# Patient Record
Sex: Female | Born: 1935 | Race: Black or African American | Hispanic: No | Smoking: Former smoker
Health system: Southern US, Community
[De-identification: ages and names within clinical notes are randomized; demographics above are authoritative.]

## PROBLEM LIST (undated history)

## (undated) DIAGNOSIS — I10 Essential (primary) hypertension: Secondary | ICD-10-CM

## (undated) DIAGNOSIS — I82419 Acute embolism and thrombosis of unspecified femoral vein: Secondary | ICD-10-CM

## (undated) DIAGNOSIS — M1990 Unspecified osteoarthritis, unspecified site: Secondary | ICD-10-CM

## (undated) DIAGNOSIS — F0390 Unspecified dementia without behavioral disturbance: Secondary | ICD-10-CM

## (undated) HISTORY — PX: ABDOMINAL HYSTERECTOMY: SHX81

## (undated) HISTORY — PX: BACK SURGERY: SHX140

---

## 1999-03-14 ENCOUNTER — Encounter: Admission: RE | Admit: 1999-03-14 | Discharge: 1999-04-04 | Payer: Self-pay | Admitting: *Deleted

## 1999-05-09 ENCOUNTER — Ambulatory Visit (HOSPITAL_COMMUNITY): Admission: RE | Admit: 1999-05-09 | Discharge: 1999-05-09 | Payer: Self-pay | Admitting: Family Medicine

## 1999-05-09 ENCOUNTER — Encounter: Payer: Self-pay | Admitting: Family Medicine

## 1999-06-09 ENCOUNTER — Other Ambulatory Visit: Admission: RE | Admit: 1999-06-09 | Discharge: 1999-06-09 | Payer: Self-pay | Admitting: Family Medicine

## 1999-07-07 ENCOUNTER — Observation Stay (HOSPITAL_COMMUNITY): Admission: RE | Admit: 1999-07-07 | Discharge: 1999-07-08 | Payer: Self-pay | Admitting: Neurosurgery

## 2000-01-13 ENCOUNTER — Encounter: Admission: RE | Admit: 2000-01-13 | Discharge: 2000-02-02 | Payer: Self-pay | Admitting: Neurosurgery

## 2000-02-06 ENCOUNTER — Encounter: Admission: RE | Admit: 2000-02-06 | Discharge: 2000-03-22 | Payer: Self-pay | Admitting: Neurosurgery

## 2002-12-30 ENCOUNTER — Ambulatory Visit (HOSPITAL_COMMUNITY): Admission: RE | Admit: 2002-12-30 | Discharge: 2002-12-30 | Payer: Self-pay | Admitting: Family Medicine

## 2002-12-30 ENCOUNTER — Encounter: Payer: Self-pay | Admitting: Family Medicine

## 2004-05-17 ENCOUNTER — Ambulatory Visit (HOSPITAL_COMMUNITY): Admission: RE | Admit: 2004-05-17 | Discharge: 2004-05-17 | Payer: Self-pay | Admitting: Family Medicine

## 2004-06-03 ENCOUNTER — Encounter: Admission: RE | Admit: 2004-06-03 | Discharge: 2004-06-03 | Payer: Self-pay | Admitting: Gastroenterology

## 2004-07-07 ENCOUNTER — Encounter: Admission: RE | Admit: 2004-07-07 | Discharge: 2004-07-07 | Payer: Self-pay | Admitting: Gastroenterology

## 2004-07-15 ENCOUNTER — Encounter: Admission: RE | Admit: 2004-07-15 | Discharge: 2004-07-15 | Payer: Self-pay | Admitting: Gastroenterology

## 2004-08-10 ENCOUNTER — Ambulatory Visit (HOSPITAL_COMMUNITY): Admission: RE | Admit: 2004-08-10 | Discharge: 2004-08-10 | Payer: Self-pay | Admitting: Gastroenterology

## 2004-08-26 ENCOUNTER — Encounter: Admission: RE | Admit: 2004-08-26 | Discharge: 2004-08-26 | Payer: Self-pay | Admitting: Gastroenterology

## 2005-11-17 ENCOUNTER — Encounter (INDEPENDENT_AMBULATORY_CARE_PROVIDER_SITE_OTHER): Payer: Self-pay | Admitting: Specialist

## 2005-11-17 ENCOUNTER — Ambulatory Visit (HOSPITAL_COMMUNITY): Admission: RE | Admit: 2005-11-17 | Discharge: 2005-11-17 | Payer: Self-pay | Admitting: Gastroenterology

## 2006-04-25 ENCOUNTER — Encounter: Admission: RE | Admit: 2006-04-25 | Discharge: 2006-04-25 | Payer: Self-pay | Admitting: Family Medicine

## 2006-05-07 IMAGING — CT CT ABDOMEN W/O CM
1 series · 15 of 32 positions shown, 19 images · IV contrast (GASTRO.)
Comparison: none

CLINICAL DATA: Right upper quadrant pain and right mid abdominal pain.  
 CT ABDOMEN WITHOUT CONTRAST ? 06/03/04 
 Scans were performed without IV contrast due to elevated creatinine of 1.7.
 The scan demonstrates a 4.8 x 6.0 cm low density lesion in the superior aspect of the left lobe of the liver.  This is nonspecific.  It could represent a benign hepatic cyst, but ultrasound may be useful for further evaluation of this liver lesion.  The remainder of the liver appears normal.  The spleen and pancreas and adrenal glands are normal.  Both kidney appear normal with no hydronephrosis or discrete mass.  There is a 9 mm lymph node between the inferior vena cava and the aorta on image #34 as well as a small, nonspecific node between the head of the pancreas and the inferior vena cava on image #21.  These are most likely benign reactive nodes.  There are no dilated bile ducts, and the gallbladder is not distended.  There are no dilated loops of large or small bowel, and there is no free air or free fluid.  Diffuse degenerative spurs are present throughout the lumbar spine.  
 IMPRESSION
 6 cm low density lesion in the left lobe of the liver, which cannot be further characterized on this unenhanced CT scan.  Ultrasound is recommended for further evaluation of this lesion.
 No other significant abnormality. 
 CT PELVIS WITHOUT CONTRAST ? 06/03/04 
 The uterus and left ovary appear to have been removed.  There are several benign appearing calcifications in the otherwise normal right ovary.  There is no free fluid or diverticular disease.  The appendix appears to have been   removed.  No dilated bowel.
 Negative CT of the pelvis.

[Series 2: — · axial · 0.70mm/px · z∈[-376,-6]mm · 15 of 83 slices shown, 19 images]
[im 6/83  soft-tissue]
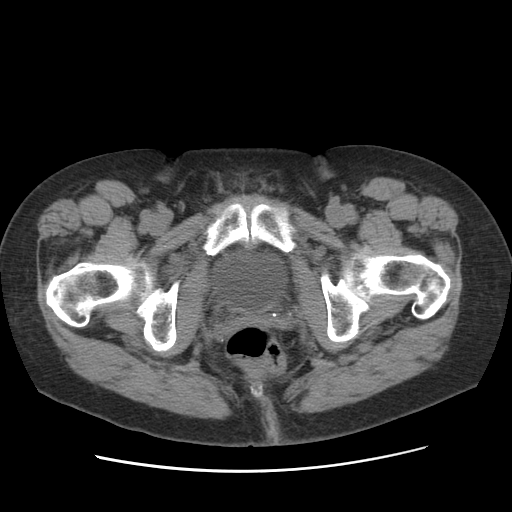
[im 6/83  bone]
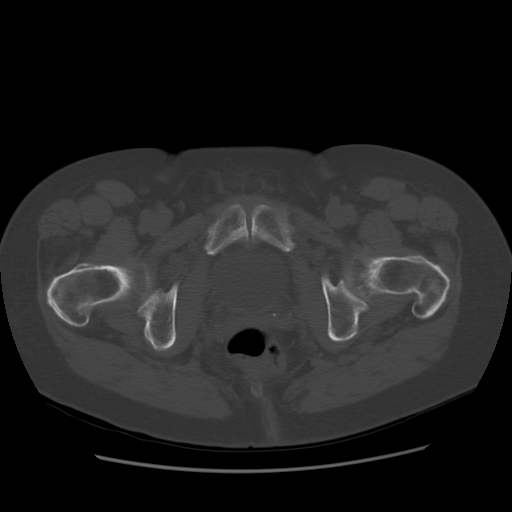
[im 11/83  soft-tissue]
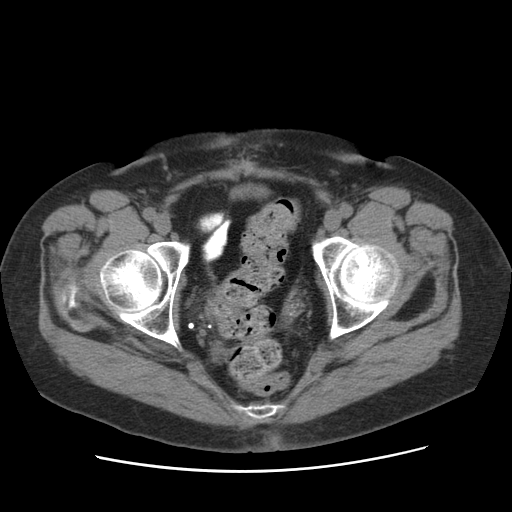
[im 16/83  soft-tissue]
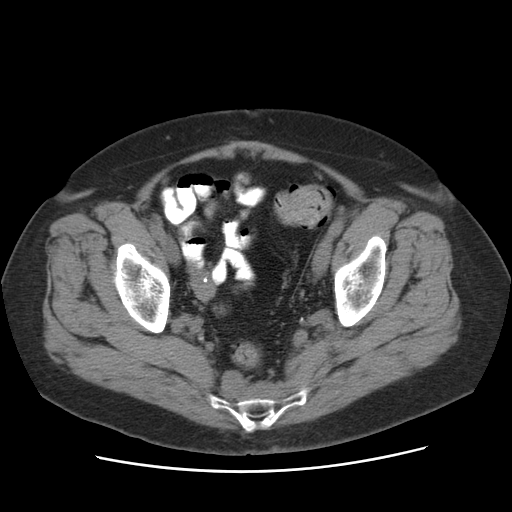
[im 24/83  soft-tissue]
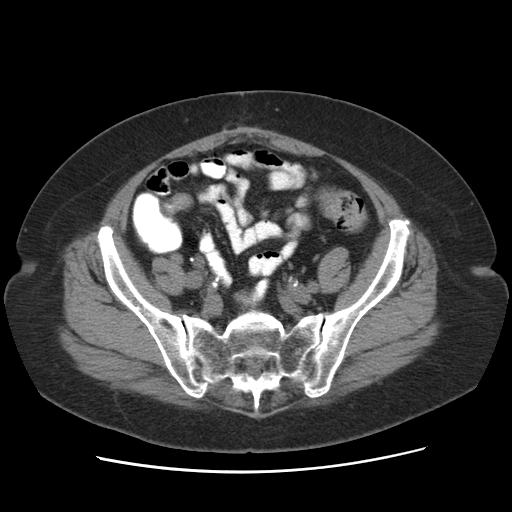
[im 30/83  soft-tissue]
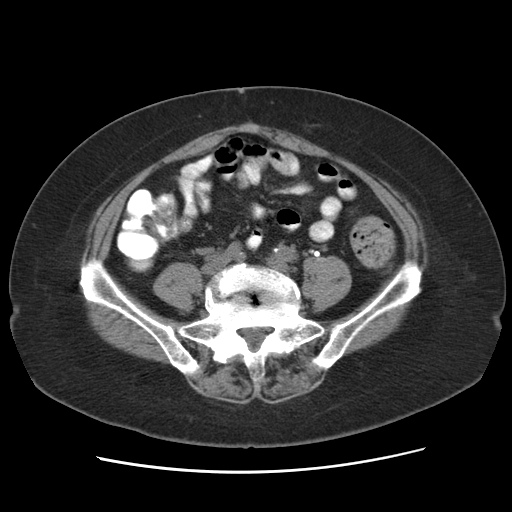
[im 35/83  soft-tissue]
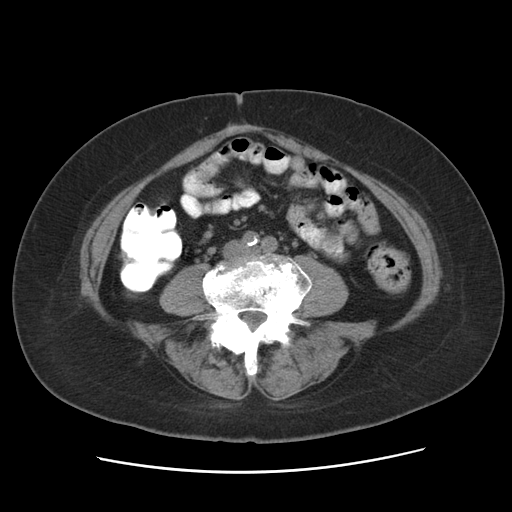
[im 43/83  soft-tissue]
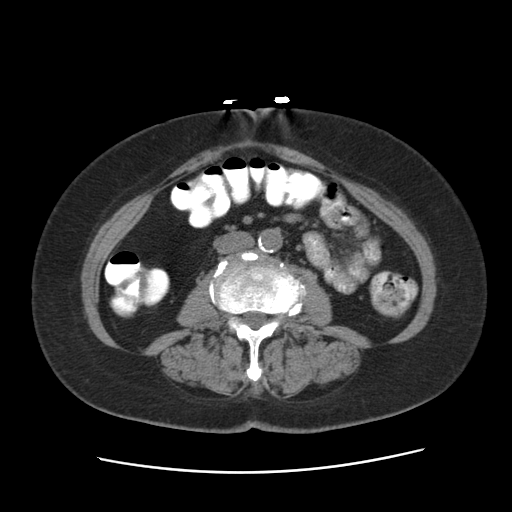
[im 48/83  soft-tissue]
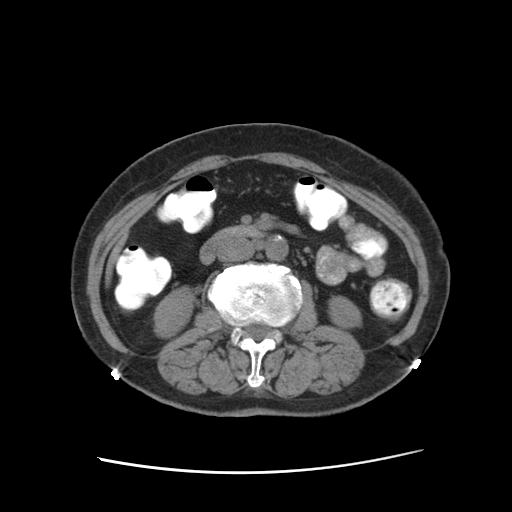
[im 53/83  soft-tissue]
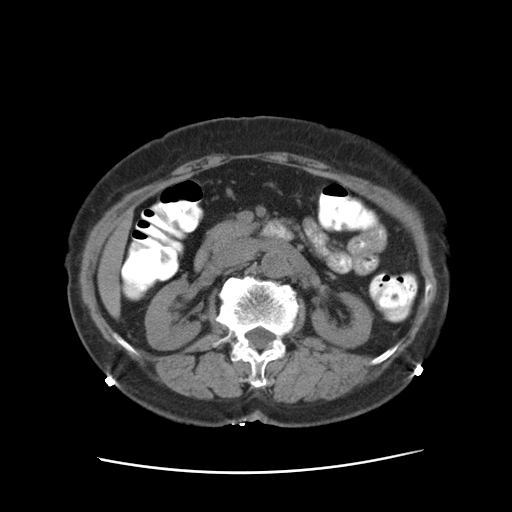
[im 53/83  bone]
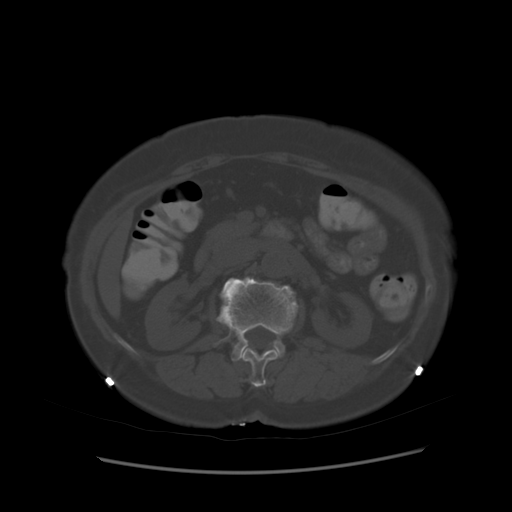
[im 59/83  soft-tissue]
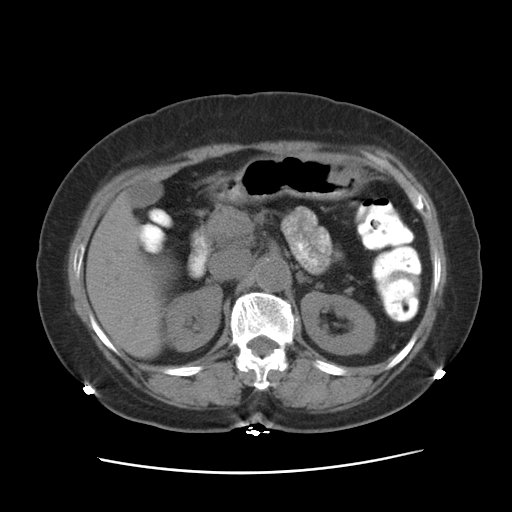
[im 67/83  soft-tissue]
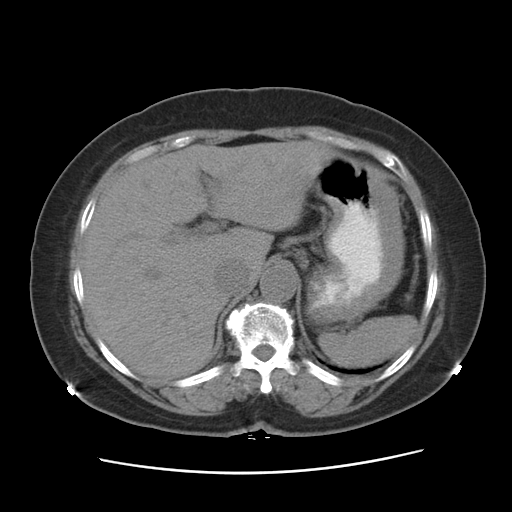
[im 72/83  soft-tissue]
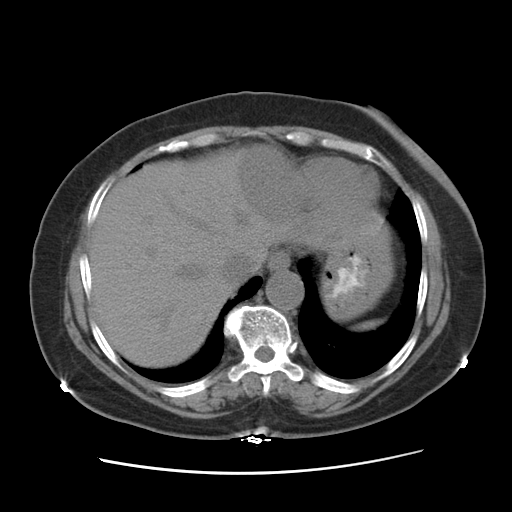
[im 72/83  lung]
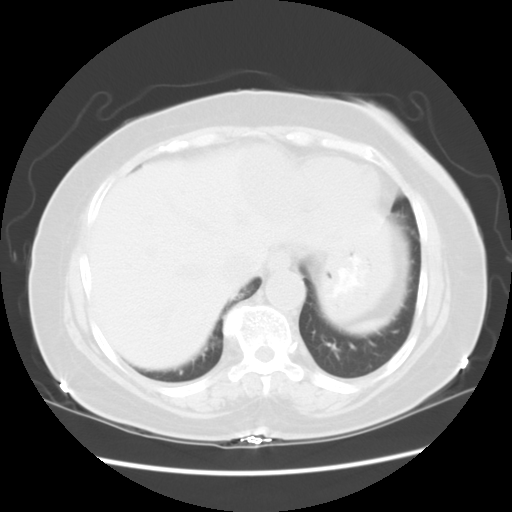
[im 75/83  lung]
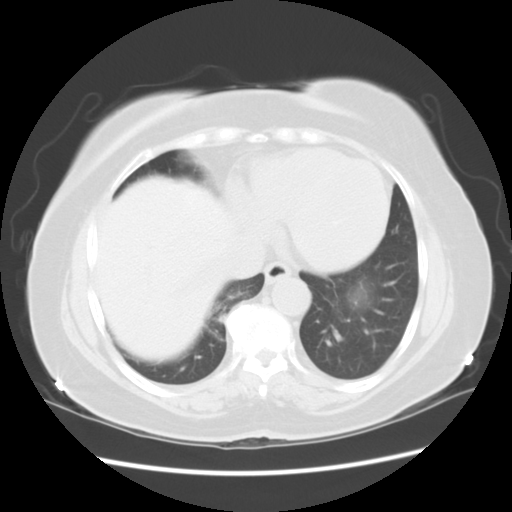
[im 77/83  soft-tissue]
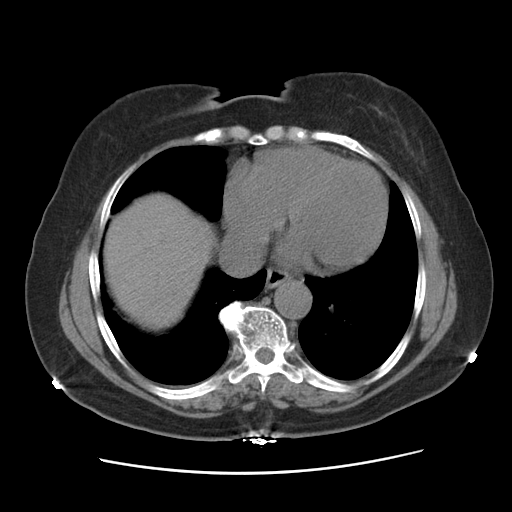
[im 77/83  lung]
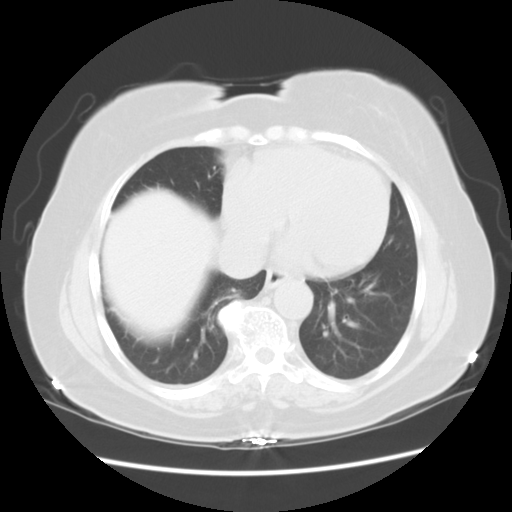
[im 80/83  lung]
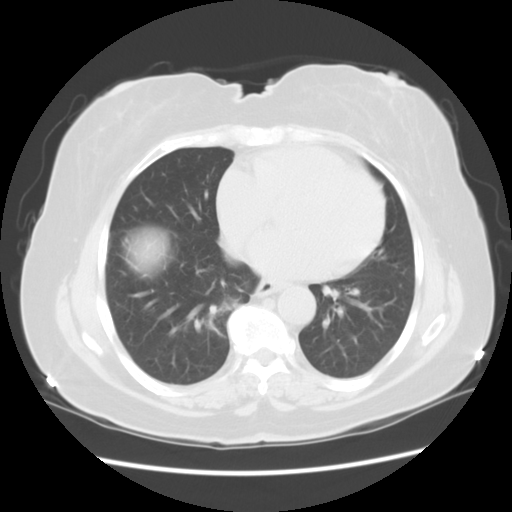

[15 of 32 positions shown; findings below may reference images not displayed]

## 2008-03-29 ENCOUNTER — Emergency Department (HOSPITAL_COMMUNITY): Admission: EM | Admit: 2008-03-29 | Discharge: 2008-03-30 | Payer: Self-pay | Admitting: Emergency Medicine

## 2008-08-04 ENCOUNTER — Encounter: Admission: RE | Admit: 2008-08-04 | Discharge: 2008-08-04 | Payer: Self-pay | Admitting: Family Medicine

## 2008-08-05 ENCOUNTER — Emergency Department (HOSPITAL_COMMUNITY): Admission: EM | Admit: 2008-08-05 | Discharge: 2008-08-05 | Payer: Self-pay | Admitting: Emergency Medicine

## 2008-09-16 ENCOUNTER — Encounter: Admission: RE | Admit: 2008-09-16 | Discharge: 2008-11-09 | Payer: Self-pay | Admitting: Family Medicine

## 2009-04-21 ENCOUNTER — Encounter: Admission: RE | Admit: 2009-04-21 | Discharge: 2009-04-21 | Payer: Self-pay | Admitting: Family Medicine

## 2009-05-04 ENCOUNTER — Ambulatory Visit (HOSPITAL_COMMUNITY): Admission: RE | Admit: 2009-05-04 | Discharge: 2009-05-04 | Payer: Self-pay | Admitting: Family Medicine

## 2009-08-24 ENCOUNTER — Encounter: Payer: Self-pay | Admitting: Neurology

## 2009-08-31 ENCOUNTER — Encounter (INDEPENDENT_AMBULATORY_CARE_PROVIDER_SITE_OTHER): Payer: Self-pay | Admitting: Neurology

## 2009-08-31 ENCOUNTER — Ambulatory Visit (HOSPITAL_COMMUNITY): Admission: RE | Admit: 2009-08-31 | Discharge: 2009-08-31 | Payer: Self-pay | Admitting: Neurology

## 2009-08-31 ENCOUNTER — Ambulatory Visit: Payer: Self-pay | Admitting: Internal Medicine

## 2009-08-31 ENCOUNTER — Ambulatory Visit: Payer: Self-pay

## 2010-09-22 ENCOUNTER — Ambulatory Visit (HOSPITAL_COMMUNITY)
Admission: RE | Admit: 2010-09-22 | Discharge: 2010-09-22 | Payer: Self-pay | Source: Home / Self Care | Attending: Internal Medicine | Admitting: Internal Medicine

## 2011-02-10 NOTE — Op Note (Signed)
[name] [name], [name] [name]                ACCOUNT NO.:  [id number]   MEDICAL RECORD NO.:  [id number]          PATIENT TYPE:  AMB   LOCATION:  ENDO                         FACILITY:  [facility]   PHYSICIAN:  Anselmo Rod, M.D.  DATE OF BIRTH:  10-Jan-1935   DATE OF PROCEDURE:  11/17/2005  DATE OF DISCHARGE:                                 OPERATIVE REPORT   PROCEDURE:  Esophagogastroduodenoscopy with gastric biopsies.   ENDOSCOPIST:  Charna Elizabeth, M.D.   INSTRUMENT USED:  Olympus video panendoscope.   INDICATIONS FOR PROCEDURE:  A 76 year old African American female underwent  EGD for dysphagia, rule out esophagitis, gastritis, Barrett's mucosa,  esophageal stricture.   PREPROCEDURE PREPARATION:  Informed consent was procured from the patient.  The patient was fasted for eight hours prior to the procedure.   PREPROCEDURE PHYSICAL:  VITAL SIGNS:  The patient had stable vital signs.  NECK:  Supple.  CHEST:  Clear to auscultation.  CARDIAC:  S1 and S2 regular.  ABDOMEN:  Soft with normal bowel sounds.   DESCRIPTION OF PROCEDURE:  The patient was placed in the left lateral  decubitus position and sedated with of fentanyl and 4 mg of Versed in  slow incremental doses.  Once the patient was adequately sedated, maintained  on low-flow oxygen and continuous cardiac monitoring, the Olympus video  panendoscope was advance through the mouth piece over the tongue, into the  esophagus under direct vision.  The entire esophagus appears widely patent  with no evidence of rings, stricture, masses, esophagitis or Barrett's  mucosa.  The scope was then advanced into the stomach.  Diffuse gastritis  was noted.  Biopsies were done to rule out presence of H pylori by  pathology.  The proximal small bowel appeared normal.  Retroflexion in the  high cardia revealed no abnormalities.   IMPRESSION:  1.Widely patent esophagus.  2.Diffuse gastritis.  Biopsy done to rule out Helicobacter pylori.  3.Normal  proximal small bowel.   RECOMMENDATIONS:  1.Await pathology result.  2.Continue PPI for now.  3.Outpatient followup in the next two weeks for further recommendations.  4.Avoidance of nonsteroidals is encouraged.      Anselmo Rod, M.D.  Electronically Signed     JNM/MEDQ  D:  11/17/2005  T:  11/18/2005  Job:  [id number]   cc:   Renaye Rakers, M.D.  Fax: [phone]   Mindi Slicker. Lowell Guitar, M.D.  Fax: [phone]

## 2012-06-05 ENCOUNTER — Emergency Department (HOSPITAL_COMMUNITY)
Admission: EM | Admit: 2012-06-05 | Discharge: 2012-06-05 | Disposition: A | Discharge status: Home / Self Care | Payer: Medicare Other | Attending: Emergency Medicine | Admitting: Emergency Medicine

## 2012-06-05 ENCOUNTER — Emergency Department (HOSPITAL_COMMUNITY): Payer: Medicare Other

## 2012-06-05 ENCOUNTER — Encounter (HOSPITAL_COMMUNITY): Payer: Self-pay | Admitting: Emergency Medicine

## 2012-06-05 DIAGNOSIS — I639 Cerebral infarction, unspecified: Secondary | ICD-10-CM

## 2012-06-05 DIAGNOSIS — R5383 Other fatigue: Secondary | ICD-10-CM | POA: Insufficient documentation

## 2012-06-05 DIAGNOSIS — F0390 Unspecified dementia without behavioral disturbance: Secondary | ICD-10-CM | POA: Insufficient documentation

## 2012-06-05 DIAGNOSIS — R5381 Other malaise: Secondary | ICD-10-CM | POA: Insufficient documentation

## 2012-06-05 DIAGNOSIS — I6350 Cerebral infarction due to unspecified occlusion or stenosis of unspecified cerebral artery: Secondary | ICD-10-CM | POA: Insufficient documentation

## 2012-06-05 DIAGNOSIS — Z79899 Other long term (current) drug therapy: Secondary | ICD-10-CM | POA: Insufficient documentation

## 2012-06-05 DIAGNOSIS — F29 Unspecified psychosis not due to a substance or known physiological condition: Secondary | ICD-10-CM | POA: Insufficient documentation

## 2012-06-05 DIAGNOSIS — R410 Disorientation, unspecified: Secondary | ICD-10-CM

## 2012-06-05 DIAGNOSIS — I10 Essential (primary) hypertension: Secondary | ICD-10-CM | POA: Insufficient documentation

## 2012-06-05 HISTORY — DX: Unspecified osteoarthritis, unspecified site: M19.90

## 2012-06-05 HISTORY — DX: Essential (primary) hypertension: I10

## 2012-06-05 HISTORY — DX: Unspecified dementia, unspecified severity, without behavioral disturbance, psychotic disturbance, mood disturbance, and anxiety: F03.90

## 2012-06-05 LAB — URINALYSIS, ROUTINE W REFLEX MICROSCOPIC
Bilirubin Urine: NEGATIVE
Glucose, UA: NEGATIVE mg/dL
Hgb urine dipstick: NEGATIVE
Ketones, ur: NEGATIVE mg/dL
Leukocytes, UA: NEGATIVE
Nitrite: NEGATIVE
Protein, ur: 100 mg/dL — AB
Specific Gravity, Urine: 1.013 (ref 1.005–1.030)
Urobilinogen, UA: 0.2 mg/dL (ref 0.0–1.0)
pH: 8 (ref 5.0–8.0)

## 2012-06-05 LAB — RAPID URINE DRUG SCREEN, HOSP PERFORMED
Amphetamines: NOT DETECTED
Barbiturates: NOT DETECTED
Benzodiazepines: NOT DETECTED
Cocaine: NOT DETECTED
Opiates: NOT DETECTED
Tetrahydrocannabinol: NOT DETECTED

## 2012-06-05 LAB — POCT I-STAT, CHEM 8
BUN: 19 mg/dL (ref 6–23)
Calcium, Ion: 1.19 mmol/L (ref 1.13–1.30)
Chloride: 103 mEq/L (ref 96–112)
Creatinine, Ser: 1.6 mg/dL — ABNORMAL HIGH (ref 0.50–1.10)
Glucose, Bld: 146 mg/dL — ABNORMAL HIGH (ref 70–99)
HCT: 38 % (ref 36.0–46.0)
Hemoglobin: 12.9 g/dL (ref 12.0–15.0)
Potassium: 4.1 mEq/L (ref 3.5–5.1)
Sodium: 143 mEq/L (ref 135–145)
TCO2: 26 mmol/L (ref 0–100)

## 2012-06-05 LAB — URINE MICROSCOPIC-ADD ON

## 2012-06-05 LAB — BASIC METABOLIC PANEL
BUN: 18 mg/dL (ref 6–23)
CO2: 27 mEq/L (ref 19–32)
Calcium: 9.3 mg/dL (ref 8.4–10.5)
Chloride: 101 mEq/L (ref 96–112)
Creatinine, Ser: 1.4 mg/dL — ABNORMAL HIGH (ref 0.50–1.10)
GFR calc Af Amer: 41 mL/min — ABNORMAL LOW (ref >90)
GFR calc non Af Amer: 36 mL/min — ABNORMAL LOW (ref >90)
Glucose, Bld: 144 mg/dL — ABNORMAL HIGH (ref 70–99)
Potassium: 4.1 mEq/L (ref 3.5–5.1)
Sodium: 137 mEq/L (ref 135–145)

## 2012-06-05 LAB — CBC
HCT: 35.3 % — ABNORMAL LOW (ref 36.0–46.0)
Hemoglobin: 11.5 g/dL — ABNORMAL LOW (ref 12.0–15.0)
MCH: 26.8 pg (ref 26.0–34.0)
MCHC: 32.6 g/dL (ref 30.0–36.0)
MCV: 82.3 fL (ref 78.0–100.0)
Platelets: 197 10*3/uL (ref 150–400)
RBC: 4.29 MIL/uL (ref 3.87–5.11)
RDW: 15.9 % — ABNORMAL HIGH (ref 11.5–15.5)
WBC: 4.4 10*3/uL (ref 4.0–10.5)

## 2012-06-05 LAB — GLUCOSE, CAPILLARY: Glucose-Capillary: 140 mg/dL — ABNORMAL HIGH (ref 70–99)

## 2012-06-05 NOTE — ED Notes (Signed)
Pt received to room 15. Pt alert to name on arrival to ED

## 2012-06-05 NOTE — ED Notes (Signed)
ekg completed

## 2012-06-05 NOTE — ED Notes (Signed)
Pt sleeping, family member at bedside.

## 2012-06-05 NOTE — Progress Notes (Signed)
Referring Physician: Delo    Chief Complaint: Confusion  HPI: [name] [name] is an 77 y.o. female who initially presented to the ED at [facility].  It seems that she awakened normal today and went to her nephrologist for a regular check up.  As usual her BP was elevated and her physician decided to make some medication changes.  Patient's blood pressure is usually markedly elevated and has been for years per family.  These changes were yet to be initiated.  On return home the family had lunch after which she just fell asleep and has been sleepy and confused since.  When patient remained difficult to arouse, EMS was called at that time.    LSN: [id number] tPA Given: No: Outside time window  Past Medical History  Diagnosis Date  . Dementia   . Hypertension   . Arthritis     Past Surgical History  Procedure Date  . Back surgery   . Abdominal hysterectomy     History reviewed. No pertinent family history. Social History:  reports that she has quit smoking. She does not have any smokeless tobacco history on file. She reports that she does not drink alcohol or use illicit drugs.  Allergies: No Known Allergies  Medications: I have reviewed the patient's current medications. Prior to Admission:  Current outpatient prescriptions:cloNIDine (CATAPRES) 0.3 MG tablet, Take 0.3 mg by mouth 2 (two) times daily., Disp: , Rfl: ;  furosemide (LASIX) 40 MG tablet, Take 40 mg by mouth 2 (two) times daily., Disp: , Rfl: ;  labetalol (NORMODYNE) 200 MG tablet, Take 400 mg by mouth 2 (two) times daily., Disp: , Rfl: ;  losartan (COZAAR) 50 MG tablet, Take 50 mg by mouth daily., Disp: , Rfl:  memantine (NAMENDA) 10 MG tablet, Take 10 mg by mouth 2 (two) times daily., Disp: , Rfl: ;  NIFEdipine (PROCARDIA XL/ADALAT-CC) 60 MG 24 hr tablet, Take 60 mg by mouth daily., Disp: , Rfl:   ROS: History obtained from daughter  General ROS: negative for - chills, fatigue, fever, night sweats, weight gain or weight  loss Psychological ROS: negative for - behavioral disorder, hallucinations, memory difficulties, mood swings or suicidal ideation Ophthalmic ROS: negative for - blurry vision, double vision, eye pain or loss of vision ENT ROS: negative for - epistaxis, nasal discharge, oral lesions, sore throat, tinnitus or vertigo Allergy and Immunology ROS: negative for - hives or itchy/watery eyes Hematological and Lymphatic ROS: negative for - bleeding problems, bruising or swollen lymph nodes Endocrine ROS: negative for - galactorrhea, hair pattern changes, polydipsia/polyuria or temperature intolerance Respiratory ROS: negative for - cough, hemoptysis, shortness of breath or wheezing Cardiovascular ROS: negative for - chest pain, dyspnea on exertion, edema or irregular heartbeat Gastrointestinal ROS: negative for - abdominal pain, diarrhea, hematemesis, nausea/vomiting or stool incontinence Genito-Urinary ROS: negative for - dysuria, hematuria, incontinence or urinary frequency/urgency Musculoskeletal ROS: negative for - joint swelling or muscular weakness Neurological ROS: as noted in HPI Dermatological ROS: negative for rash and skin lesion changes  Physical Examination: Blood pressure 125/62, pulse 75, temperature 97.6 F (36.4 C), temperature source Oral, resp. rate 18, SpO2 100.00%. CV-pulses intact Neurologic Examination: Mental Status: Alert.  Oriented to name and place but not to month.  Clearly confused.  Unable to recall any events from the day.  Speech fluent without evidence of aphasia.  Able to follow 3 step commands without difficulty. Cranial Nerves: II: Discs flat bilaterally; Visual fields grossly normal, pupils equal, round, reactive to light  and accommodation III,IV, VI: ptosis not present, extra-ocular motions intact bilaterally V,VII: decreased right NLF, facial light touch sensation normal bilaterally VIII: hearing normal bilaterally IX,X: gag reflex present XI: bilateral  shoulder shrug XII: midline tongue extension Motor: Right : Upper extremity   5/5    Left:     Upper extremity   5/5  Lower extremity   5/5     Lower extremity   5/5 Tone and bulk:normal tone throughout; no atrophy noted Sensory: Pinprick and light touch intact throughout, bilaterally Deep Tendon Reflexes: 2+ in the upper extremities and at the right knee, absent otherwise. Plantars: Right: downgoing   Left: downgoing Cerebellar: normal finger-to-nose and normal heel-to-shin test Gait: Unable to perform  Laboratory Studies:  Basic Metabolic Panel:  Lab 06/05/12 1478 06/05/12 1448  NA 143 137  K 4.1 4.1  CL 103 101  CO2 -- 27  GLUCOSE 146* 144*  BUN 19 18  CREATININE 1.60* 1.40*  CALCIUM -- 9.3  MG -- --  PHOS -- --    Liver Function Tests: No results found for this basename: AST:5,ALT:5,ALKPHOS:5,BILITOT:5,PROT:5,ALBUMIN:5 in the last 168 hours No results found for this basename: LIPASE:5,AMYLASE:5 in the last 168 hours No results found for this basename: AMMONIA:3 in the last 168 hours  CBC:  Lab 06/05/12 1456 06/05/12 1448  WBC -- 4.4  NEUTROABS -- --  HGB 12.9 11.5*  HCT 38.0 35.3*  MCV -- 82.3  PLT -- 197    Cardiac Enzymes: No results found for this basename: CKTOTAL:5,CKMB:5,CKMBINDEX:5,TROPONINI:5 in the last 168 hours  BNP: No components found with this basename: POCBNP:5  CBG:  Lab 06/05/12 1436  GLUCAP 140*    Microbiology: No results found for this or any previous visit.  Coagulation Studies: No results found for this basename: LABPROT:5,INR:5 in the last 72 hours  Urinalysis:  Lab 06/05/12 1712  COLORURINE YELLOW  LABSPEC 1.013  PHURINE 8.0  GLUCOSEU NEGATIVE  HGBUR NEGATIVE  BILIRUBINUR NEGATIVE  KETONESUR NEGATIVE  PROTEINUR 100*  UROBILINOGEN 0.2  NITRITE NEGATIVE  LEUKOCYTESUR NEGATIVE    Lipid Panel: No results found for this basename: chol, trig, hdl, cholhdl, vldl, ldlcalc    HgbA1C:  No results found for this  basename: HGBA1C    Urine Drug Screen:   No results found for this basename: labopia, cocainscrnur, labbenz, amphetmu, thcu, labbarb    Alcohol Level: No results found for this basename: ETH:2 in the last 168 hours  Other results: EKG: trigeminy  Imaging: Dg Chest 1 View  06/05/2012  *RADIOLOGY REPORT*  Clinical Data: Weakness, fatigue.  CHEST - 1 VIEW  Comparison: None.  Findings: Low lung volumes with cardiomegaly and mild vascular congestion.  No confluent opacities or edema.  No acute bony abnormality.  IMPRESSION: Low lung volumes with mild cardiomegaly and vascular congestion.   Original Report Authenticated By: Cyndie Chime, M.D.    Ct Head Wo Contrast  06/05/2012  *RADIOLOGY REPORT*  Clinical Data: Altered mental status.  Weakness.  CT HEAD WITHOUT CONTRAST  Technique:  Contiguous axial images were obtained from the base of the skull through the vertex without contrast.  Comparison: 08/24/2009  Findings: No acute intracranial abnormality.  Specifically, no hemorrhage, hydrocephalus, mass lesion, acute infarction, or significant intracranial injury.  No acute calvarial abnormality. Visualized paranasal sinuses and mastoids clear.  Orbital soft tissues unremarkable.  IMPRESSION: No acute intracranial abnormality.   Original Report Authenticated By: Cyndie Chime, M.D.     Assessment: 77 y.o. female presenting with confusion  but no other lateralizing signs on her exam.  EKG abnormal.  BP in the normal range which is quite unusual for this patient.  Concerned about a cardiac cause to her symptoms.  Patient outside the time window for tPA and NIHSS not elevated enough to warrant intervention.  CT reviewed and unremarkable.  Case discussed with Dr. Judd Lien and family.   Stroke Risk Factors - hypertension  Plan: 1. HgbA1c, fasting lipid panel 2. MRI, MRA  of the brain without contrast with further stroke work up to be performed only if acute infarct noted on imaging.  3. ASA 81mg   daily 4. Telemetry monitoring 5. Frequent neuro checks   Thana Farr, MD [facility] [phone] 06/05/2012, 5:57 PM

## 2012-06-05 NOTE — ED Notes (Signed)
Per EMS: Pt was recently put on clonidine.  Started it today and was supposed to take 1 pill twice a day but instead took 2 pills this morning.  Pt began feeling lethargic about 1300 today.  Pt is extremely fatigued (will fall asleep if you stop talking to her for a few seconds).  Denies chest pain, SOB, negative stroke, no NVD.

## 2012-06-05 NOTE — ED Notes (Signed)
NIH stroke scale performed by rapid response team. Pt able to follow directions, family at bedside. EKG repeated.

## 2012-06-05 NOTE — ED Notes (Signed)
AVW:[id number]<XB> Expected date:<BR> Expected time:<BR> Means of arrival:<BR> Comments:<BR> weakness

## 2012-06-05 NOTE — ED Notes (Signed)
Code stroke canceled  

## 2012-06-05 NOTE — ED Provider Notes (Signed)
History     CSN: [id number]  Arrival date & time 06/05/12  1404   First MD Initiated Contact with Patient 06/05/12 1512      Chief Complaint  Patient presents with  . Fatigue  . Weakness    (Consider location/radiation/quality/duration/timing/severity/associated sxs/prior treatment) HPI Comments: Pt with hx of HTN, HL comes in with cc of fatigue, difficult to arouse. Pt with no hx of strokes brought in to the hospital by family as she was acting different. Pt was having lunch, was doing well, and then she just fell asleep, and has been sleepy the whole time since, difficult to arouse - and famly decided to call EMS. Last normal, 12:30-1 pm per family. No hx of strokes, no n/v/f/c/falls. Mo meds changes, no narcotics being taken currently. No hx of similar complains before.  LEVEL 5 CAVEAT - PT CONFUSED.  Patient is a 77 y.o. female presenting with weakness. The history is provided by the patient.  Weakness  Additional symptoms include weakness.    Past Medical History  Diagnosis Date  . Dementia   . Hypertension   . Arthritis     Past Surgical History  Procedure Date  . Back surgery   . Abdominal hysterectomy     History reviewed. No pertinent family history.  History  Substance Use Topics  . Smoking status: Former Games developer  . Smokeless tobacco: Not on file  . Alcohol Use: No    OB History    Grav Para Term Preterm Abortions TAB SAB Ect Mult Living                  Review of Systems  Unable to perform ROS: Mental status change  Neurological: Positive for weakness.    Allergies  Review of patient's allergies indicates no known allergies.  Home Medications   Current Outpatient Rx  Name Route Sig Dispense Refill  . CLONIDINE HCL 0.3 MG PO TABS Oral Take 0.3 mg by mouth 2 (two) times daily.    . FUROSEMIDE 40 MG PO TABS Oral Take 40 mg by mouth 2 (two) times daily.    Marland Kitchen LABETALOL HCL 200 MG PO TABS Oral Take 400 mg by mouth 2 (two) times daily.    Marland Kitchen  LOSARTAN POTASSIUM 50 MG PO TABS Oral Take 50 mg by mouth daily.    Marland Kitchen MEMANTINE HCL 10 MG PO TABS Oral Take 10 mg by mouth 2 (two) times daily.    Marland Kitchen NIFEDIPINE ER OSMOTIC 60 MG PO TB24 Oral Take 60 mg by mouth daily.      BP 113/58  Pulse 79  Temp 98.6 F (37 C) (Oral)  Resp 18  SpO2 98%  Physical Exam  Nursing note and vitals reviewed. Constitutional: She appears well-developed and well-nourished.       SLEEPY - WAS DIFFICULT TO AROUSE  HENT:  Head: Normocephalic and atraumatic.  Eyes: EOM are normal. Pupils are equal, round, and reactive to light.  Neck: Neck supple.  Cardiovascular: Normal rate, regular rhythm and normal heart sounds.   No murmur heard. Pulmonary/Chest: Effort normal. No respiratory distress. She has no wheezes.  Abdominal: Soft. She exhibits no distension. There is no tenderness. There is no rebound and no guarding.  Neurological:       Arousable, not alert, sluggish Oriented to self and location. Mild right sided facial droop. Cerebellar exam is WNL, PERRL - no pi point pupils, Motor and sensory exams are wnl.  Skin: Skin is warm and dry.  ED Course  Procedures (including critical care time)  Labs Reviewed  CBC - Abnormal; Notable for the following:    Hemoglobin 11.5 (*)     HCT 35.3 (*)     RDW 15.9 (*)     All other components within normal limits  BASIC METABOLIC PANEL - Abnormal; Notable for the following:    Glucose, Bld 144 (*)     Creatinine, Ser 1.40 (*)     GFR calc non Af Amer 36 (*)     GFR calc Af Amer 41 (*)     All other components within normal limits  GLUCOSE, CAPILLARY - Abnormal; Notable for the following:    Glucose-Capillary 140 (*)     All other components within normal limits  POCT I-STAT, CHEM 8 - Abnormal; Notable for the following:    Creatinine, Ser 1.60 (*)     Glucose, Bld 146 (*)     All other components within normal limits  CBC WITH DIFFERENTIAL  BASIC METABOLIC PANEL  TROPONIN I  URINALYSIS, ROUTINE W  REFLEX MICROSCOPIC  URINE RAPID DRUG SCREEN (HOSP PERFORMED)   No results found.   1. Stroke       MDM  DDx includes:  Stroke - ischemic vs. hemorrhagic TIA Neuropathy Myelitis Electrolyte abnormality Neuropathy Muscular disease Medication OD Hypoglycemia  Pt comes in with cc of change in mental status. She is somnolent, difficult to arouse, but arousable, and i have no focal neuro findings besides possible droop and the alertness level. She has no meds that put her at risk for having AMS, and there is no report of fall - and the time of onset is withing the stroke treatment window for this otherwise very functional woman.  Code stroke to be activated. Dr. Thad Ranger given a signout.        Derwood Kaplan, MD 06/05/12 1652

## 2012-06-05 NOTE — ED Notes (Signed)
Stroke team at bedside on arrival to ED

## 2012-06-10 NOTE — ED Provider Notes (Signed)
The patient was sent to [facility] this afternoon for evaluation of possible CVA.  She was seen by Dr. Rhunette Croft at [facility] and a Code Stroke was initiated.  The head ct was negative for stroke and was then sent here for neurological consultation.  She was seen by Dr. Thad Ranger who did not feel as though this was a neurological issue.  She was more concerned about there being frequent pvc's on the ekg and rhythm strip.  She asked me to discuss admission with medicine for further observation and workup as indicated.  Dr. Thad Ranger also did not feel as though further imaging with an mri was indicated.    At this point, I went in to re-evaluate the patient.  She was feeling much better and had no complaints.  The sleepiness and difficulty speaking had completely resolved.  Upon further questioning, she had informed me that she had taken a new medication just prior to the onset of her symptoms.  She was started on a rather large dose of clonidine for her blood pressure, 0.3 mg twice daily.  I re-examined her and vitals remained stable and she remained afebrile.  She was neurologically intact.  There was no focal weakness or deficits and her speech was clear.  The heart was regular rate and rhythm and the lungs were clear.  I reviewed the patient's laboratory tests, imaging, studies, and ekg.  There were pvc's on the ekg but no all other tests were either normal or at her baseline.  As far as the pvc's go, they were frequent but were asymptomatic and likely incidental.    I had a lengthy conversation with her and her family about what had transpired while she was here, why she had been transferred, and the results of the neurology consultation.  Based on the timing of the event and the results of the tests, I get the impression that this episode was more likely related to an adverse reaction to being started on a rather large dose of clonidine for someone of her size and stature.  Dr. Thad Ranger doubted a stroke and I agree  with this.  I had discussed admission with the patient and family, however she stated that she preferred to go home.  I explained to them the risks and benefits of being in the hospital, however they have decided that discharge to home is the preferred disposition.  She and the family assure me that they will follow up with her primary doctor in the very near future and return here should her symptoms worsen or recur.  I have also advised them that she should decrease her dose of clonidine to 0.15 mg (break the 3 mg tablets in half) twice daily.      Geoffery Lyons, MD 06/10/12 [phone]

## 2012-08-14 ENCOUNTER — Other Ambulatory Visit (HOSPITAL_COMMUNITY): Payer: Self-pay | Admitting: Cardiovascular Disease

## 2012-08-14 DIAGNOSIS — I10 Essential (primary) hypertension: Secondary | ICD-10-CM

## 2012-08-14 DIAGNOSIS — R9431 Abnormal electrocardiogram [ECG] [EKG]: Secondary | ICD-10-CM

## 2012-08-14 DIAGNOSIS — R55 Syncope and collapse: Secondary | ICD-10-CM

## 2012-09-03 ENCOUNTER — Ambulatory Visit (HOSPITAL_COMMUNITY): Payer: Medicare Other

## 2012-09-03 ENCOUNTER — Encounter (HOSPITAL_COMMUNITY): Payer: Medicare Other

## 2012-09-25 ENCOUNTER — Emergency Department (HOSPITAL_COMMUNITY)
Admission: EM | Admit: 2012-09-25 | Discharge: 2012-09-25 | Disposition: A | Discharge status: Home / Self Care | Payer: Medicare Other | Source: Home / Self Care | Attending: Family Medicine | Admitting: Family Medicine

## 2012-09-25 ENCOUNTER — Encounter (HOSPITAL_COMMUNITY): Payer: Self-pay | Admitting: *Deleted

## 2012-09-25 DIAGNOSIS — L723 Sebaceous cyst: Secondary | ICD-10-CM

## 2012-09-25 DIAGNOSIS — L089 Local infection of the skin and subcutaneous tissue, unspecified: Secondary | ICD-10-CM

## 2012-09-25 NOTE — ED Notes (Signed)
Pt's BP informed RN Alinda Money and rechecked BP

## 2012-09-25 NOTE — ED Notes (Signed)
Pt  Reports    A      Painfull        Swollen   Area to back  Of  Neck         X  sev  Weeks        The  Area  Is  Red  Tender  And  Swollen

## 2012-09-25 NOTE — ED Provider Notes (Signed)
History     CSN: [id number]  Arrival date & time 09/25/12  1655   First MD Initiated Contact with Patient 09/25/12 1748      Chief Complaint  Patient presents with  . Abscess    (Consider location/radiation/quality/duration/timing/severity/associated sxs/prior treatment) Patient is a 78 y.o. female presenting with abscess. The history is provided by the patient and a relative.  Abscess  This is a new problem. The current episode started less than one week ago. The onset was sudden. The problem has been gradually worsening. The abscess is present on the neck. The problem is moderate. The abscess is characterized by painfulness and swelling. The abscess first occurred at home.    Past Medical History  Diagnosis Date  . Dementia   . Hypertension   . Arthritis     Past Surgical History  Procedure Date  . Back surgery   . Abdominal hysterectomy     No family history on file.  History  Substance Use Topics  . Smoking status: Former Games developer  . Smokeless tobacco: Not on file  . Alcohol Use: No    OB History    Grav Para Term Preterm Abortions TAB SAB Ect Mult Living                  Review of Systems  Constitutional: Negative.   Skin: Positive for wound.    Allergies  Review of patient's allergies indicates no known allergies.  Home Medications   Current Outpatient Rx  Name  Route  Sig  Dispense  Refill  . CLONIDINE HCL 0.3 MG PO TABS   Oral   Take 0.3 mg by mouth 2 (two) times daily.         . FUROSEMIDE 40 MG PO TABS   Oral   Take 40 mg by mouth 2 (two) times daily.         Marland Kitchen LABETALOL HCL 200 MG PO TABS   Oral   Take 400 mg by mouth 2 (two) times daily.         Marland Kitchen LOSARTAN POTASSIUM 50 MG PO TABS   Oral   Take 50 mg by mouth daily.         Marland Kitchen MEMANTINE HCL 10 MG PO TABS   Oral   Take 10 mg by mouth 2 (two) times daily.         Marland Kitchen NIFEDIPINE ER OSMOTIC 60 MG PO TB24   Oral   Take 60 mg by mouth daily.           BP 134/51  Pulse  40  Temp 97.4 F (36.3 C) (Oral)  Resp 20  SpO2 100%  Physical Exam  Nursing note and vitals reviewed. Constitutional: She is oriented to person, place, and time. She appears well-developed and well-nourished.  Neurological: She is alert and oriented to person, place, and time.  Skin: Skin is warm and dry.       3x4cm infected sebaceous cyst on right post neg., tender , erythematous, fluctuant.    ED Course  INCISION AND DRAINAGE Date/Time: 09/25/2012 6:27 PM Performed by: Linna Hoff Authorized by: Bradd Canary D Consent: Verbal consent obtained. Consent given by: patient Type: abscess Body area: head/neck Location details: neck Local anesthetic: topical anesthetic Patient sedated: no Scalpel size: 11 Incision type: single straight Complexity: simple Drainage: purulent (sebaceous cyst drained and sac expressed.) Drainage amount: moderate Wound treatment: wound left open Packing material: none Patient tolerance: Patient tolerated the procedure well with no  immediate complications.   (including critical care time)  Labs Reviewed - No data to display No results found.   1. Infected sebaceous cyst       MDM          Linna Hoff, MD 09/25/12 [phone]

## 2012-11-27 ENCOUNTER — Other Ambulatory Visit: Payer: Self-pay | Admitting: Internal Medicine

## 2012-11-27 DIAGNOSIS — Z1231 Encounter for screening mammogram for malignant neoplasm of breast: Secondary | ICD-10-CM

## 2012-12-31 ENCOUNTER — Ambulatory Visit
Admission: RE | Admit: 2012-12-31 | Discharge: 2012-12-31 | Disposition: A | Discharge status: Home / Self Care | Payer: Medicare Other | Source: Ambulatory Visit | Attending: Internal Medicine | Admitting: Internal Medicine

## 2012-12-31 DIAGNOSIS — Z1231 Encounter for screening mammogram for malignant neoplasm of breast: Secondary | ICD-10-CM

## 2013-02-09 ENCOUNTER — Emergency Department (HOSPITAL_COMMUNITY): Payer: Medicare Other

## 2013-02-09 ENCOUNTER — Emergency Department (HOSPITAL_COMMUNITY)
Admission: EM | Admit: 2013-02-09 | Discharge: 2013-02-09 | Disposition: A | Discharge status: Home / Self Care | Payer: Medicare Other | Attending: Emergency Medicine | Admitting: Emergency Medicine

## 2013-02-09 ENCOUNTER — Encounter (HOSPITAL_COMMUNITY): Payer: Self-pay | Admitting: Family Medicine

## 2013-02-09 DIAGNOSIS — Z79899 Other long term (current) drug therapy: Secondary | ICD-10-CM | POA: Insufficient documentation

## 2013-02-09 DIAGNOSIS — I10 Essential (primary) hypertension: Secondary | ICD-10-CM | POA: Insufficient documentation

## 2013-02-09 DIAGNOSIS — R42 Dizziness and giddiness: Secondary | ICD-10-CM | POA: Insufficient documentation

## 2013-02-09 DIAGNOSIS — Z8673 Personal history of transient ischemic attack (TIA), and cerebral infarction without residual deficits: Secondary | ICD-10-CM | POA: Insufficient documentation

## 2013-02-09 DIAGNOSIS — Z8739 Personal history of other diseases of the musculoskeletal system and connective tissue: Secondary | ICD-10-CM | POA: Insufficient documentation

## 2013-02-09 DIAGNOSIS — Z87891 Personal history of nicotine dependence: Secondary | ICD-10-CM | POA: Insufficient documentation

## 2013-02-09 DIAGNOSIS — I498 Other specified cardiac arrhythmias: Secondary | ICD-10-CM | POA: Insufficient documentation

## 2013-02-09 DIAGNOSIS — R112 Nausea with vomiting, unspecified: Secondary | ICD-10-CM | POA: Insufficient documentation

## 2013-02-09 DIAGNOSIS — F0390 Unspecified dementia without behavioral disturbance: Secondary | ICD-10-CM | POA: Insufficient documentation

## 2013-02-09 LAB — URINALYSIS, ROUTINE W REFLEX MICROSCOPIC
Bilirubin Urine: NEGATIVE
Glucose, UA: NEGATIVE mg/dL
Hgb urine dipstick: NEGATIVE
Ketones, ur: NEGATIVE mg/dL
Leukocytes, UA: NEGATIVE
Nitrite: NEGATIVE
Protein, ur: 30 mg/dL — AB
Specific Gravity, Urine: 1.013 (ref 1.005–1.030)
Urobilinogen, UA: 0.2 mg/dL (ref 0.0–1.0)
pH: 8.5 — ABNORMAL HIGH (ref 5.0–8.0)

## 2013-02-09 LAB — POCT I-STAT TROPONIN I
Troponin i, poc: 0.01 ng/mL (ref 0.00–0.08)
Troponin i, poc: 0.01 ng/mL (ref 0.00–0.08)
Troponin i, poc: 0.03 ng/mL (ref 0.00–0.08)

## 2013-02-09 LAB — BASIC METABOLIC PANEL
BUN: 19 mg/dL (ref 6–23)
CO2: 29 mEq/L (ref 19–32)
Calcium: 9.8 mg/dL (ref 8.4–10.5)
Chloride: 97 mEq/L (ref 96–112)
Creatinine, Ser: 1.23 mg/dL — ABNORMAL HIGH (ref 0.50–1.10)
GFR calc Af Amer: 48 mL/min — ABNORMAL LOW (ref >90)
GFR calc non Af Amer: 42 mL/min — ABNORMAL LOW (ref >90)
Glucose, Bld: 135 mg/dL — ABNORMAL HIGH (ref 70–99)
Potassium: 4.3 mEq/L (ref 3.5–5.1)
Sodium: 135 mEq/L (ref 135–145)

## 2013-02-09 LAB — CBC
HCT: 40.1 % (ref 36.0–46.0)
Hemoglobin: 13.4 g/dL (ref 12.0–15.0)
MCH: 26.8 pg (ref 26.0–34.0)
MCHC: 33.4 g/dL (ref 30.0–36.0)
MCV: 80.2 fL (ref 78.0–100.0)
Platelets: 240 10*3/uL (ref 150–400)
RBC: 5 MIL/uL (ref 3.87–5.11)
RDW: 15.6 % — ABNORMAL HIGH (ref 11.5–15.5)
WBC: 7 10*3/uL (ref 4.0–10.5)

## 2013-02-09 LAB — URINE MICROSCOPIC-ADD ON

## 2013-02-09 MED ORDER — SODIUM CHLORIDE 0.9 % IV SOLN
Freq: Once | INTRAVENOUS | Status: AC
  Administered 2013-02-09: 17:00:00 via INTRAVENOUS

## 2013-02-09 MED ORDER — SODIUM CHLORIDE 0.9 % IV SOLN
Freq: Once | INTRAVENOUS | Status: AC
  Administered 2013-02-09: 18:00:00 via INTRAVENOUS

## 2013-02-09 NOTE — ED Notes (Signed)
Patient is alert and orientedx4.  Patient was explained discharge instructions and they understood them with no questions.  The patient's daughter, [name] [name] is taking the patient home.

## 2013-02-09 NOTE — ED Notes (Signed)
Pt HR in the 70s NSR. incorrect rate of 40 charted earlier

## 2013-02-09 NOTE — ED Provider Notes (Signed)
History     CSN: [id number]  Arrival date & time 02/09/13  1253   First MD Initiated Contact with Patient 02/09/13 1530      Chief Complaint  Patient presents with  . Dizziness    (Consider location/radiation/quality/duration/timing/severity/associated sxs/prior treatment) Patient is a 78 y.o. female presenting with neurologic complaint. The history is provided by the patient and medical records (The daughter). No language interpreter was used.  Neurologic Problem The primary symptoms include dizziness, nausea and vomiting. Primary symptoms comment: Pt is a 78 yo woman who had dizziness and nausea and vomiting around 11:30 A.M. today.  Now she feels sleepy.  . The symptoms began 2 to 6 hours ago (She had had a prior similar episode in September 2013, was worked up for possible stroke then.). The symptoms are resolved (She feels sleepy now.). The neurological symptoms are diffuse. Context: She has a history of dementia.    She describes the dizziness as lightheadedness. The dizziness began today. The dizziness has been resolved since its onset. Dizziness is a new (Though had workup for possible stroke in September 2013.) problem. Dizziness also occurs with nausea and vomiting.  Associated medical issues comments: Dementia. Procedure history comments: Prior stroke workup in September 2013 was negative..    Past Medical History  Diagnosis Date  . Dementia   . Hypertension   . Arthritis     Past Surgical History  Procedure Laterality Date  . Back surgery    . Abdominal hysterectomy      History reviewed. No pertinent family history.  History  Substance Use Topics  . Smoking status: Former Games developer  . Smokeless tobacco: Not on file  . Alcohol Use: No    OB History   Grav Para Term Preterm Abortions TAB SAB Ect Mult Living                  Review of Systems  Unable to perform ROS: Dementia  Gastrointestinal: Positive for nausea and vomiting.  Neurological: Positive for  dizziness.    Allergies  Review of patient's allergies indicates no known allergies.  Home Medications   Current Outpatient Rx  Name  Route  Sig  Dispense  Refill  . cloNIDine (CATAPRES) 0.3 MG tablet   Oral   Take 0.3 mg by mouth 2 (two) times daily.         . furosemide (LASIX) 40 MG tablet   Oral   Take 40 mg by mouth 2 (two) times daily.         Marland Kitchen labetalol (NORMODYNE) 200 MG tablet   Oral   Take 400 mg by mouth 2 (two) times daily.         Marland Kitchen losartan (COZAAR) 50 MG tablet   Oral   Take 50 mg by mouth daily.         . memantine (NAMENDA) 10 MG tablet   Oral   Take 10 mg by mouth 2 (two) times daily.         Marland Kitchen NIFEdipine (PROCARDIA XL/ADALAT-CC) 60 MG 24 hr tablet   Oral   Take 60 mg by mouth daily.           BP 146/62  Pulse 75  Temp(Src) 97.3 F (36.3 C)  Resp 16  SpO2 100%  Physical Exam  Nursing note and vitals reviewed. Constitutional: She appears well-developed and well-nourished. No distress.  Several VS early in visit showed a bradycardia.    HENT:  Head: Normocephalic and atraumatic.  Right  Ear: External ear normal.  Left Ear: External ear normal.  Mouth/Throat: Oropharynx is clear and moist.  Eyes: Conjunctivae and EOM are normal. Pupils are equal, round, and reactive to light.  Neck: Normal range of motion. Neck supple.  Cardiovascular: Normal rate, regular rhythm and normal heart sounds.   Pulmonary/Chest: Effort normal and breath sounds normal.  Abdominal: Soft. Bowel sounds are normal.  Musculoskeletal: Normal range of motion. She exhibits no edema and no tenderness.  Neurological: She is alert.  No sensory or motor deficit.  Skin: Skin is warm and dry.  Psychiatric:  Pleasantly demented.    ED Course  Procedures (including critical care time)  Labs Reviewed  CBC - Abnormal; Notable for the following:    RDW 15.6 (*)    All other components within normal limits  BASIC METABOLIC PANEL - Abnormal; Notable for the  following:    Glucose, Bld 135 (*)    Creatinine, Ser 1.23 (*)    GFR calc non Af Amer 42 (*)    GFR calc Af Amer 48 (*)    All other components within normal limits  POCT I-STAT TROPONIN I   3:37 PM  Date: 02/09/2013  Rate: 70  Rhythm: normal sinus rhythm and premature ventricular contractions (PVC)  QRS Axis: normal  Intervals: normal PQRS:  Biatrial enlargement.  ST/T Wave abnormalities: Inverted T waves in inferior and lateral leads.  Conduction Disutrbances:none  Narrative Interpretation: Abnormal EKG  Old EKG Reviewed: changes noted--Had prolonged QT interval in tracing of 06/05/2012.  6:54 PM Results for orders placed during the hospital encounter of 02/09/13  CBC      Result Value Range   WBC 7.0  4.0 - 10.5 K/uL   RBC 5.00  3.87 - 5.11 MIL/uL   Hemoglobin 13.4  12.0 - 15.0 g/dL   HCT 04.5  40.9 - 81.1 %   MCV 80.2  78.0 - 100.0 fL   MCH 26.8  26.0 - 34.0 pg   MCHC 33.4  30.0 - 36.0 g/dL   RDW 91.4 (*) 78.2 - 95.6 %   Platelets 240  150 - 400 K/uL  BASIC METABOLIC PANEL      Result Value Range   Sodium 135  135 - 145 mEq/L   Potassium 4.3  3.5 - 5.1 mEq/L   Chloride 97  96 - 112 mEq/L   CO2 29  19 - 32 mEq/L   Glucose, Bld 135 (*) 70 - 99 mg/dL   BUN 19  6 - 23 mg/dL   Creatinine, Ser 2.13 (*) 0.50 - 1.10 mg/dL   Calcium 9.8  8.4 - 08.6 mg/dL   GFR calc non Af Amer 42 (*) >90 mL/min   GFR calc Af Amer 48 (*) >90 mL/min  POCT I-STAT TROPONIN I      Result Value Range   Troponin i, poc 0.01  0.00 - 0.08 ng/mL   Comment 3           POCT I-STAT TROPONIN I      Result Value Range   Troponin i, poc 0.03  0.00 - 0.08 ng/mL   Comment 3           POCT I-STAT TROPONIN I      Result Value Range   Troponin i, poc 0.01  0.00 - 0.08 ng/mL   Comment 3            Ct Head Wo Contrast  02/09/2013   *RADIOLOGY REPORT*  Clinical Data: Dizziness and  vomiting  CT HEAD WITHOUT CONTRAST  Technique:  Contiguous axial images were obtained from the base of the skull through the  vertex without contrast.  Comparison: Head CT 06/05/2012  Findings: No acute intracranial abnormality is identified. Specifically, there is no hemorrhage, hydrocephalus, mass lesion, or evidence of acute infarction.  The calvarium is intact.  The visualized paranasal sinuses and mastoid air cells are clear.  IMPRESSION: No acute intracranial abnormality.   Original Report Authenticated By: Britta Mccreedy, M.D.   Lab workup is negative.  She has a low recorded pulse because she has some bigeminy.  Repeat orthostatic VS and EKG.   7:21 PM  Date: 02/09/2013  Rate: 92  Rhythm: normal sinus rhythm and premature ventricular contractions (PVC) in a bigeminal rhythm  QRS Axis: normal  Intervals: normal  ST/T Wave abnormalities: nonspecific T wave changes  Conduction Disutrbances:none  Narrative Interpretation: Abnormal EKG  Old EKG Reviewed: changes noted--bigeminy is new since tracing earlier this PM.  7:53 PM Was not orthostatic, feels ready to go home.  Advised followup with her internist, Andi Devon, M.D.      1. Dizziness   2. Ventricular bigeminy           Carleene Cooper III, MD 02/09/13 [phone]

## 2013-02-09 NOTE — ED Notes (Signed)
Per pt and family dizziness, vomiting since this am. sts some abdominal cramping and chest pain.

## 2014-02-20 ENCOUNTER — Other Ambulatory Visit: Payer: Self-pay | Admitting: Internal Medicine

## 2014-02-20 DIAGNOSIS — Z1231 Encounter for screening mammogram for malignant neoplasm of breast: Secondary | ICD-10-CM

## 2014-03-05 ENCOUNTER — Ambulatory Visit
Admission: RE | Admit: 2014-03-05 | Discharge: 2014-03-05 | Disposition: A | Discharge status: Home / Self Care | Payer: Medicare Other | Source: Ambulatory Visit | Attending: Internal Medicine | Admitting: Internal Medicine

## 2014-03-05 DIAGNOSIS — Z1231 Encounter for screening mammogram for malignant neoplasm of breast: Secondary | ICD-10-CM

## 2014-09-30 DIAGNOSIS — M1990 Unspecified osteoarthritis, unspecified site: Secondary | ICD-10-CM | POA: Diagnosis not present

## 2014-09-30 DIAGNOSIS — I129 Hypertensive chronic kidney disease with stage 1 through stage 4 chronic kidney disease, or unspecified chronic kidney disease: Secondary | ICD-10-CM | POA: Diagnosis not present

## 2014-09-30 DIAGNOSIS — G301 Alzheimer's disease with late onset: Secondary | ICD-10-CM | POA: Diagnosis not present

## 2014-09-30 DIAGNOSIS — N183 Chronic kidney disease, stage 3 (moderate): Secondary | ICD-10-CM | POA: Diagnosis not present

## 2015-03-24 ENCOUNTER — Other Ambulatory Visit: Payer: Self-pay

## 2015-03-24 DIAGNOSIS — Z1231 Encounter for screening mammogram for malignant neoplasm of breast: Secondary | ICD-10-CM

## 2015-04-20 ENCOUNTER — Ambulatory Visit: Payer: Self-pay

## 2015-04-20 ENCOUNTER — Ambulatory Visit
Admission: RE | Admit: 2015-04-20 | Discharge: 2015-04-20 | Disposition: A | Discharge status: Home / Self Care | Payer: Medicare Other | Source: Ambulatory Visit

## 2015-04-20 DIAGNOSIS — Z1231 Encounter for screening mammogram for malignant neoplasm of breast: Secondary | ICD-10-CM

## 2016-05-03 ENCOUNTER — Other Ambulatory Visit: Payer: Self-pay | Admitting: Internal Medicine

## 2016-05-03 DIAGNOSIS — Z1231 Encounter for screening mammogram for malignant neoplasm of breast: Secondary | ICD-10-CM

## 2016-06-12 ENCOUNTER — Ambulatory Visit
Admission: RE | Admit: 2016-06-12 | Discharge: 2016-06-12 | Disposition: A | Discharge status: Home / Self Care | Payer: Medicare Other | Source: Ambulatory Visit | Attending: Internal Medicine | Admitting: Internal Medicine

## 2016-06-12 DIAGNOSIS — Z1231 Encounter for screening mammogram for malignant neoplasm of breast: Secondary | ICD-10-CM

## 2016-08-23 ENCOUNTER — Other Ambulatory Visit: Payer: Self-pay | Admitting: Internal Medicine

## 2016-08-23 ENCOUNTER — Ambulatory Visit
Admission: RE | Admit: 2016-08-23 | Discharge: 2016-08-23 | Disposition: A | Discharge status: Home / Self Care | Payer: Medicare Other | Source: Ambulatory Visit | Attending: Internal Medicine | Admitting: Internal Medicine

## 2016-08-23 DIAGNOSIS — M546 Pain in thoracic spine: Secondary | ICD-10-CM

## 2016-08-30 ENCOUNTER — Other Ambulatory Visit: Payer: Self-pay | Admitting: Internal Medicine

## 2016-08-30 DIAGNOSIS — M546 Pain in thoracic spine: Secondary | ICD-10-CM

## 2016-09-01 ENCOUNTER — Other Ambulatory Visit: Payer: Medicare Other

## 2016-09-02 ENCOUNTER — Other Ambulatory Visit: Payer: Medicare Other

## 2016-09-05 ENCOUNTER — Ambulatory Visit
Admission: RE | Admit: 2016-09-05 | Discharge: 2016-09-05 | Disposition: A | Discharge status: Home / Self Care | Payer: Medicare Other | Source: Ambulatory Visit | Attending: Internal Medicine | Admitting: Internal Medicine

## 2016-09-05 DIAGNOSIS — M546 Pain in thoracic spine: Secondary | ICD-10-CM

## 2016-10-30 ENCOUNTER — Other Ambulatory Visit: Payer: Self-pay | Admitting: Neurosurgery

## 2016-10-30 DIAGNOSIS — M5442 Lumbago with sciatica, left side: Principal | ICD-10-CM

## 2016-10-30 DIAGNOSIS — M5441 Lumbago with sciatica, right side: Secondary | ICD-10-CM

## 2016-11-06 ENCOUNTER — Ambulatory Visit
Admission: RE | Admit: 2016-11-06 | Discharge: 2016-11-06 | Disposition: A | Discharge status: Home / Self Care | Payer: Medicare Other | Source: Ambulatory Visit | Attending: Neurosurgery | Admitting: Neurosurgery

## 2016-11-06 ENCOUNTER — Other Ambulatory Visit: Payer: Self-pay | Admitting: Neurosurgery

## 2016-11-06 DIAGNOSIS — M5441 Lumbago with sciatica, right side: Secondary | ICD-10-CM

## 2016-11-06 DIAGNOSIS — M5442 Lumbago with sciatica, left side: Principal | ICD-10-CM

## 2016-11-13 ENCOUNTER — Other Ambulatory Visit: Payer: Self-pay | Admitting: Neurosurgery

## 2016-11-13 ENCOUNTER — Ambulatory Visit
Admission: RE | Admit: 2016-11-13 | Discharge: 2016-11-13 | Disposition: A | Discharge status: Home / Self Care | Payer: Medicare Other | Source: Ambulatory Visit | Attending: Neurosurgery | Admitting: Neurosurgery

## 2016-11-13 DIAGNOSIS — M25552 Pain in left hip: Principal | ICD-10-CM

## 2016-11-13 DIAGNOSIS — M25551 Pain in right hip: Secondary | ICD-10-CM

## 2016-11-14 ENCOUNTER — Other Ambulatory Visit: Payer: Self-pay | Admitting: Neurosurgery

## 2016-11-14 DIAGNOSIS — M48062 Spinal stenosis, lumbar region with neurogenic claudication: Secondary | ICD-10-CM

## 2016-11-16 ENCOUNTER — Other Ambulatory Visit: Payer: Medicare Other

## 2016-11-20 ENCOUNTER — Ambulatory Visit
Admission: RE | Admit: 2016-11-20 | Discharge: 2016-11-20 | Disposition: A | Discharge status: Home / Self Care | Payer: Medicare Other | Source: Ambulatory Visit | Attending: Neurosurgery | Admitting: Neurosurgery

## 2016-11-20 DIAGNOSIS — M48062 Spinal stenosis, lumbar region with neurogenic claudication: Secondary | ICD-10-CM

## 2016-11-20 MED ORDER — METHYLPREDNISOLONE ACETATE 40 MG/ML INJ SUSP (RADIOLOG
120.0000 mg | Freq: Once | INTRAMUSCULAR | Status: AC
  Administered 2016-11-20: 120 mg via EPIDURAL

## 2016-11-20 MED ORDER — IOPAMIDOL (ISOVUE-M 200) INJECTION 41%
1.0000 mL | Freq: Once | INTRAMUSCULAR | Status: AC
  Administered 2016-11-20: 1 mL via EPIDURAL

## 2016-11-20 NOTE — Discharge Instructions (Signed)

## 2017-01-19 ENCOUNTER — Other Ambulatory Visit: Payer: Self-pay | Admitting: Physical Medicine and Rehabilitation

## 2017-01-19 DIAGNOSIS — M48062 Spinal stenosis, lumbar region with neurogenic claudication: Secondary | ICD-10-CM

## 2017-01-29 ENCOUNTER — Ambulatory Visit
Admission: RE | Admit: 2017-01-29 | Discharge: 2017-01-29 | Disposition: A | Discharge status: Home / Self Care | Payer: Medicare Other | Source: Ambulatory Visit | Attending: Physical Medicine and Rehabilitation | Admitting: Physical Medicine and Rehabilitation

## 2017-01-29 DIAGNOSIS — M48062 Spinal stenosis, lumbar region with neurogenic claudication: Secondary | ICD-10-CM

## 2017-01-29 MED ORDER — IOPAMIDOL (ISOVUE-M 200) INJECTION 41%
1.0000 mL | Freq: Once | INTRAMUSCULAR | Status: AC
  Administered 2017-01-29: 1 mL via EPIDURAL

## 2017-01-29 MED ORDER — METHYLPREDNISOLONE ACETATE 40 MG/ML INJ SUSP (RADIOLOG
120.0000 mg | Freq: Once | INTRAMUSCULAR | Status: AC
  Administered 2017-01-29: 120 mg via EPIDURAL

## 2017-01-29 NOTE — Discharge Instructions (Signed)

## 2017-09-12 ENCOUNTER — Inpatient Hospital Stay (HOSPITAL_COMMUNITY)
Admission: EM | Admit: 2017-09-12 | Discharge: 2017-09-15 | DRG: 684 | Disposition: A | Discharge status: Home Health | Payer: Medicare Other | Attending: Internal Medicine | Admitting: Internal Medicine

## 2017-09-12 ENCOUNTER — Other Ambulatory Visit: Payer: Self-pay

## 2017-09-12 ENCOUNTER — Encounter (HOSPITAL_COMMUNITY): Payer: Self-pay | Admitting: *Deleted

## 2017-09-12 DIAGNOSIS — E785 Hyperlipidemia, unspecified: Secondary | ICD-10-CM | POA: Diagnosis present

## 2017-09-12 DIAGNOSIS — Z23 Encounter for immunization: Secondary | ICD-10-CM

## 2017-09-12 DIAGNOSIS — N179 Acute kidney failure, unspecified: Secondary | ICD-10-CM | POA: Diagnosis not present

## 2017-09-12 DIAGNOSIS — Z90710 Acquired absence of both cervix and uterus: Secondary | ICD-10-CM

## 2017-09-12 DIAGNOSIS — F0390 Unspecified dementia without behavioral disturbance: Secondary | ICD-10-CM | POA: Diagnosis present

## 2017-09-12 DIAGNOSIS — Z791 Long term (current) use of non-steroidal anti-inflammatories (NSAID): Secondary | ICD-10-CM

## 2017-09-12 DIAGNOSIS — E860 Dehydration: Secondary | ICD-10-CM | POA: Diagnosis present

## 2017-09-12 DIAGNOSIS — M549 Dorsalgia, unspecified: Secondary | ICD-10-CM | POA: Diagnosis present

## 2017-09-12 DIAGNOSIS — Z79899 Other long term (current) drug therapy: Secondary | ICD-10-CM

## 2017-09-12 DIAGNOSIS — I10 Essential (primary) hypertension: Secondary | ICD-10-CM | POA: Diagnosis present

## 2017-09-12 DIAGNOSIS — E876 Hypokalemia: Secondary | ICD-10-CM | POA: Diagnosis present

## 2017-09-12 DIAGNOSIS — K5909 Other constipation: Secondary | ICD-10-CM | POA: Diagnosis present

## 2017-09-12 DIAGNOSIS — M1990 Unspecified osteoarthritis, unspecified site: Secondary | ICD-10-CM

## 2017-09-12 DIAGNOSIS — G8929 Other chronic pain: Secondary | ICD-10-CM | POA: Diagnosis present

## 2017-09-12 LAB — CBC
HCT: 39.9 % (ref 36.0–46.0)
Hemoglobin: 12.8 g/dL (ref 12.0–15.0)
MCH: 27.6 pg (ref 26.0–34.0)
MCHC: 32.1 g/dL (ref 30.0–36.0)
MCV: 86.2 fL (ref 78.0–100.0)
Platelets: 239 10*3/uL (ref 150–400)
RBC: 4.63 MIL/uL (ref 3.87–5.11)
RDW: 14.8 % (ref 11.5–15.5)
WBC: 6.3 10*3/uL (ref 4.0–10.5)

## 2017-09-12 LAB — COMPREHENSIVE METABOLIC PANEL
ALT: 11 U/L — ABNORMAL LOW (ref 14–54)
AST: 21 U/L (ref 15–41)
Albumin: 4.2 g/dL (ref 3.5–5.0)
Alkaline Phosphatase: 108 U/L (ref 38–126)
Anion gap: 13 (ref 5–15)
BUN: 85 mg/dL — ABNORMAL HIGH (ref 6–20)
CO2: 28 mmol/L (ref 22–32)
Calcium: 9.4 mg/dL (ref 8.9–10.3)
Chloride: 95 mmol/L — ABNORMAL LOW (ref 101–111)
Creatinine, Ser: 4.6 mg/dL — ABNORMAL HIGH (ref 0.44–1.00)
GFR calc Af Amer: 9 mL/min — ABNORMAL LOW (ref >60)
GFR calc non Af Amer: 8 mL/min — ABNORMAL LOW (ref >60)
Glucose, Bld: 139 mg/dL — ABNORMAL HIGH (ref 65–99)
Potassium: 3.7 mmol/L (ref 3.5–5.1)
Sodium: 136 mmol/L (ref 135–145)
Total Bilirubin: 0.7 mg/dL (ref 0.3–1.2)
Total Protein: 8.7 g/dL — ABNORMAL HIGH (ref 6.5–8.1)

## 2017-09-12 LAB — URINALYSIS, ROUTINE W REFLEX MICROSCOPIC
Bilirubin Urine: NEGATIVE
Glucose, UA: NEGATIVE mg/dL
Hgb urine dipstick: NEGATIVE
Ketones, ur: NEGATIVE mg/dL
Leukocytes, UA: NEGATIVE
Nitrite: NEGATIVE
Protein, ur: NEGATIVE mg/dL
Specific Gravity, Urine: 1.009 (ref 1.005–1.030)
pH: 6 (ref 5.0–8.0)

## 2017-09-12 NOTE — ED Triage Notes (Signed)
Since the weekend she has had fatique back pain headache  No appeitite dizziness  No n v  She has a regular doctor but she has not been able to get an appointmwent

## 2017-09-13 ENCOUNTER — Encounter (HOSPITAL_COMMUNITY): Payer: Self-pay | Admitting: *Deleted

## 2017-09-13 ENCOUNTER — Inpatient Hospital Stay (HOSPITAL_COMMUNITY): Payer: Medicare Other

## 2017-09-13 ENCOUNTER — Emergency Department (HOSPITAL_COMMUNITY): Payer: Medicare Other

## 2017-09-13 DIAGNOSIS — I10 Essential (primary) hypertension: Secondary | ICD-10-CM | POA: Diagnosis present

## 2017-09-13 DIAGNOSIS — E876 Hypokalemia: Secondary | ICD-10-CM | POA: Diagnosis present

## 2017-09-13 DIAGNOSIS — E860 Dehydration: Secondary | ICD-10-CM | POA: Diagnosis present

## 2017-09-13 DIAGNOSIS — N179 Acute kidney failure, unspecified: Secondary | ICD-10-CM | POA: Diagnosis present

## 2017-09-13 DIAGNOSIS — Z791 Long term (current) use of non-steroidal anti-inflammatories (NSAID): Secondary | ICD-10-CM | POA: Diagnosis not present

## 2017-09-13 DIAGNOSIS — M1990 Unspecified osteoarthritis, unspecified site: Secondary | ICD-10-CM | POA: Diagnosis present

## 2017-09-13 DIAGNOSIS — G8929 Other chronic pain: Secondary | ICD-10-CM | POA: Diagnosis present

## 2017-09-13 DIAGNOSIS — F0390 Unspecified dementia without behavioral disturbance: Secondary | ICD-10-CM

## 2017-09-13 DIAGNOSIS — K5909 Other constipation: Secondary | ICD-10-CM | POA: Diagnosis present

## 2017-09-13 DIAGNOSIS — M549 Dorsalgia, unspecified: Secondary | ICD-10-CM

## 2017-09-13 DIAGNOSIS — Z90710 Acquired absence of both cervix and uterus: Secondary | ICD-10-CM | POA: Diagnosis not present

## 2017-09-13 DIAGNOSIS — E785 Hyperlipidemia, unspecified: Secondary | ICD-10-CM | POA: Diagnosis present

## 2017-09-13 DIAGNOSIS — Z79899 Other long term (current) drug therapy: Secondary | ICD-10-CM | POA: Diagnosis not present

## 2017-09-13 DIAGNOSIS — Z23 Encounter for immunization: Secondary | ICD-10-CM | POA: Diagnosis present

## 2017-09-13 MED ORDER — ATORVASTATIN CALCIUM 20 MG PO TABS
20.0000 mg | ORAL_TABLET | Freq: Every day | ORAL | Status: DC
  Administered 2017-09-13 – 2017-09-15 (×3): 20 mg via ORAL
  Filled 2017-09-13 (×3): qty 1

## 2017-09-13 MED ORDER — INFLUENZA VAC SPLIT HIGH-DOSE 0.5 ML IM SUSY
0.5000 mL | PREFILLED_SYRINGE | INTRAMUSCULAR | Status: AC
  Administered 2017-09-14: 0.5 mL via INTRAMUSCULAR
  Filled 2017-09-13: qty 0.5

## 2017-09-13 MED ORDER — ONDANSETRON HCL 4 MG PO TABS: 4.0000 mg | ORAL_TABLET | Freq: Four times a day (QID) | ORAL | Status: DC | PRN

## 2017-09-13 MED ORDER — NIFEDIPINE ER OSMOTIC RELEASE 90 MG PO TB24
90.0000 mg | ORAL_TABLET | Freq: Every day | ORAL | Status: DC
Start: 2017-09-13 — End: 2017-09-15
  Administered 2017-09-13 – 2017-09-15 (×3): 90 mg via ORAL
  Filled 2017-09-13 (×3): qty 1

## 2017-09-13 MED ORDER — BARIUM SULFATE 2.1 % PO SUSP
ORAL | Status: AC
  Filled 2017-09-13: qty 2

## 2017-09-13 MED ORDER — MEMANTINE HCL-DONEPEZIL HCL ER 28-10 MG PO CP24: 1.0000 | ORAL_CAPSULE | Freq: Every day | ORAL | Status: DC

## 2017-09-13 MED ORDER — HYDRALAZINE HCL 20 MG/ML IJ SOLN: 5.0000 mg | Freq: Three times a day (TID) | INTRAMUSCULAR | Status: DC | PRN

## 2017-09-13 MED ORDER — MEMANTINE HCL ER 28 MG PO CP24
28.0000 mg | ORAL_CAPSULE | Freq: Every day | ORAL | Status: DC
  Administered 2017-09-13 – 2017-09-15 (×3): 28 mg via ORAL
  Filled 2017-09-13 (×3): qty 1

## 2017-09-13 MED ORDER — FUROSEMIDE 40 MG PO TABS: 40.0000 mg | ORAL_TABLET | Freq: Two times a day (BID) | ORAL | Status: DC

## 2017-09-13 MED ORDER — SENNOSIDES-DOCUSATE SODIUM 8.6-50 MG PO TABS
1.0000 | ORAL_TABLET | Freq: Two times a day (BID) | ORAL | Status: DC
  Administered 2017-09-13 – 2017-09-15 (×3): 1 via ORAL
  Filled 2017-09-13 (×4): qty 1

## 2017-09-13 MED ORDER — HEPARIN SODIUM (PORCINE) 5000 UNIT/ML IJ SOLN
5000.0000 [IU] | Freq: Three times a day (TID) | INTRAMUSCULAR | Status: DC
  Administered 2017-09-13 – 2017-09-15 (×7): 5000 [IU] via SUBCUTANEOUS
  Filled 2017-09-13 (×7): qty 1

## 2017-09-13 MED ORDER — BISACODYL 10 MG RE SUPP: 10.0000 mg | Freq: Every day | RECTAL | Status: DC | PRN

## 2017-09-13 MED ORDER — FUROSEMIDE 20 MG PO TABS: 20.0000 mg | ORAL_TABLET | Freq: Two times a day (BID) | ORAL | Status: DC

## 2017-09-13 MED ORDER — ACETAMINOPHEN 325 MG PO TABS: 650.0000 mg | ORAL_TABLET | Freq: Four times a day (QID) | ORAL | Status: DC | PRN

## 2017-09-13 MED ORDER — COQ10 100 MG PO CAPS: 1.0000 | ORAL_CAPSULE | Freq: Every day | ORAL | Status: DC

## 2017-09-13 MED ORDER — ONDANSETRON HCL 4 MG/2ML IJ SOLN: 4.0000 mg | Freq: Four times a day (QID) | INTRAMUSCULAR | Status: DC | PRN

## 2017-09-13 MED ORDER — SODIUM CHLORIDE 0.9 % IV SOLN
Freq: Once | INTRAVENOUS | Status: AC
  Administered 2017-09-13: via INTRAVENOUS

## 2017-09-13 MED ORDER — SODIUM CHLORIDE 0.9 % IV BOLUS (SEPSIS)
500.0000 mL | Freq: Once | INTRAVENOUS | Status: AC
  Administered 2017-09-13: 500 mL via INTRAVENOUS

## 2017-09-13 MED ORDER — ACETAMINOPHEN 650 MG RE SUPP: 650.0000 mg | Freq: Four times a day (QID) | RECTAL | Status: DC | PRN

## 2017-09-13 MED ORDER — MORPHINE SULFATE (PF) 4 MG/ML IV SOLN: 1.0000 mg | INTRAVENOUS | Status: DC | PRN

## 2017-09-13 MED ORDER — SODIUM CHLORIDE 0.9 % IV SOLN
INTRAVENOUS | Status: AC
  Administered 2017-09-13 – 2017-09-14 (×2): via INTRAVENOUS

## 2017-09-13 MED ORDER — DONEPEZIL HCL 10 MG PO TABS
10.0000 mg | ORAL_TABLET | Freq: Every day | ORAL | Status: DC
  Administered 2017-09-13 – 2017-09-14 (×2): 10 mg via ORAL
  Filled 2017-09-13 (×2): qty 1

## 2017-09-13 MED ORDER — SENNOSIDES-DOCUSATE SODIUM 8.6-50 MG PO TABS: 1.0000 | ORAL_TABLET | Freq: Every evening | ORAL | Status: DC | PRN

## 2017-09-13 MED ORDER — OLMESARTAN MEDOXOMIL-HCTZ 40-25 MG PO TABS: 1.0000 | ORAL_TABLET | Freq: Every day | ORAL | Status: DC

## 2017-09-13 MED ORDER — FLEET ENEMA 7-19 GM/118ML RE ENEM: 1.0000 | ENEMA | Freq: Once | RECTAL | Status: DC | PRN

## 2017-09-13 MED ORDER — HYDROCODONE-ACETAMINOPHEN 5-325 MG PO TABS: 1.0000 | ORAL_TABLET | ORAL | Status: DC | PRN

## 2017-09-13 MED ORDER — MELOXICAM 7.5 MG PO TABS: 7.5000 mg | ORAL_TABLET | Freq: Every day | ORAL | Status: DC

## 2017-09-13 MED ORDER — IRBESARTAN 150 MG PO TABS: 150.0000 mg | ORAL_TABLET | Freq: Every day | ORAL | Status: DC

## 2017-09-13 NOTE — ED Provider Notes (Signed)
[facility] EM[facility]ENCY [facility] Provider Note   CSN: [id number] Arrival date & time: 09/12/17  1812     History   Chief Complaint Chief Complaint  Patient presents with  . Generalized Body Aches    HPI [name] [name] is a 82 y.o. female.  Patient brought to the emergency department by family for evaluation of generalized malaise, headache, weakness, dizziness and abdominal pain.  She has had poor appetite, has not been eating or drinking.  All symptoms have been present for 4 or 5 days.  She has not had any vomiting or diarrhea.  She has been making urine, has not had any urinary symptoms.      Past Medical History:  Diagnosis Date  . Arthritis   . Dementia   . Hypertension     Patient Active Problem List   Diagnosis Date Noted  . Confusion 06/05/2012    Past Surgical History:  Procedure Laterality Date  . ABDOMINAL HYSTERECTOMY    . BACK SURGERY      OB History    No data available       Home Medications    Prior to Admission medications   Medication Sig Start Date End Date Taking? Authorizing Provider  acetaminophen-codeine (TYLENOL #3) 300-30 MG tablet Take 1-2 tablets by mouth every 6 (six) hours as needed for moderate pain.    Yes [provider]  atorvastatin (LIPITOR) 20 MG tablet Take 20 mg by mouth daily.   Yes [provider]  Coenzyme Q10 (COQ10) 100 MG CAPS Take 1 tablet by mouth daily.   Yes [provider]  furosemide (LASIX) 40 MG tablet Take 40 mg by mouth 2 (two) times daily.   Yes [provider]  irbesartan (AVAPRO) 150 MG tablet Take 150 mg by mouth daily.   Yes [provider]  meloxicam (MOBIC) 7.5 MG tablet Take 7.5 mg by mouth daily.    Yes [provider]  Memantine HCl-Donepezil HCl (NAMZARIC) 28-10 MG CP24 TAKE 1 CAPSULE(S) EVERY DAY BY ORAL ROUTE. 09/23/16  Yes [provider]  NIFEdipine (PROCARDIA-XL/ADALAT CC) 30 MG 24 hr tablet Take 90 mg by  mouth daily.   Yes [provider]  olmesartan-hydrochlorothiazide (BENICAR HCT) 40-25 MG tablet Take 1 tablet by mouth daily.   Yes [provider]    Family History No family history on file.  Social History Social History   Tobacco Use  . Smoking status: Former Games developermoker  . Smokeless tobacco: Never Used  Substance Use Topics  . Alcohol use: No  . Drug use: No     Allergies   Patient has no known allergies.   Review of Systems Review of Systems  Constitutional: Positive for fatigue.  Gastrointestinal: Positive for abdominal pain.  Neurological: Positive for dizziness and headaches.  All other systems reviewed and are negative.    Physical Exam Updated Vital Signs BP 122/65   Pulse 65   Temp 97.7 F (36.5 C) (Oral)   Resp 18   Ht 4\' 11"  (1.499 m)   Wt 75.8 kg (167 lb)   SpO2 98%   BMI 33.73 kg/m   Physical Exam  Constitutional: She is oriented to person, place, and time. She appears well-developed and well-nourished. No distress.  HENT:  Head: Normocephalic and atraumatic.  Right Ear: Hearing normal.  Left Ear: Hearing normal.  Nose: Nose normal.  Mouth/Throat: Oropharynx is clear and moist and mucous membranes are normal.  Eyes: Conjunctivae and EOM are normal.  Pupils are equal, round, and reactive to light.  Neck: Normal range of motion. Neck supple.  Cardiovascular: Regular rhythm, S1 normal and S2 normal. Exam reveals no gallop and no friction rub.  No murmur heard. Pulmonary/Chest: Effort normal and breath sounds normal. No respiratory distress. She exhibits no tenderness.  Abdominal: Soft. Normal appearance and bowel sounds are normal. There is no hepatosplenomegaly. There is generalized tenderness. There is no rebound, no guarding, no tenderness at McBurney's point and negative Murphy's sign. No hernia.  Musculoskeletal: Normal range of motion.  Neurological: She is alert and oriented to person, place, and time. She has normal  strength. No cranial nerve deficit or sensory deficit. Coordination normal. GCS eye subscore is 4. GCS verbal subscore is 5. GCS motor subscore is 6.  Skin: Skin is warm, dry and intact. No rash noted. No cyanosis.  Psychiatric: She has a normal mood and affect. Her speech is normal and behavior is normal. Thought content normal.  Nursing note and vitals reviewed.    ED Treatments / Results  Labs (all labs ordered are listed, but only abnormal results are displayed) Labs Reviewed  COMPREHENSIVE METABOLIC PANEL - Abnormal; Notable for the following components:      Result Value   Chloride 95 (*)    Glucose, Bld 139 (*)    BUN 85 (*)    Creatinine, Ser 4.60 (*)    Total Protein 8.7 (*)    ALT 11 (*)    GFR calc non Af Amer 8 (*)    GFR calc Af Amer 9 (*)    All other components within normal limits  CBC  URINALYSIS, ROUTINE W REFLEX MICROSCOPIC    EKG  EKG Interpretation  Date/Time:  Wednesday September 12 2017 18:49:54 EST Ventricular Rate:  63 PR Interval:  168 QRS Duration: 104 QT Interval:  452 QTC Calculation: 462 R Axis:   10 Text Interpretation:  Normal sinus rhythm Nonspecific ST abnormality Abnormal ECG No significant change since last tracing Confirmed by Gilda Creaseollina, Miu Chiong J [phone]([id number]) on 09/12/2017 11:58:25 PM       Radiology Ct Abdomen Pelvis Wo Contrast  Result Date: 09/13/2017 CLINICAL DATA:  Abdominal pain.  Fatigue.  Decreased appetite. EXAM: CT ABDOMEN AND PELVIS WITHOUT CONTRAST TECHNIQUE: Multidetector CT imaging of the abdomen and pelvis was performed following the standard protocol without IV contrast. COMPARISON:  CT 06/03/2004 FINDINGS: Lower chest: The lung bases are clear. Hepatobiliary: Mild hepatic steatosis. The left lobe hemangioma on prior exam is not as well visualized, there is vague hypodensity. Gallbladder partially distended, no calcified stone or pericholecystic inflammation. Pancreas: Parenchymal atrophy. No ductal dilatation or  inflammation. Spleen: Spleen small in size.  No focal abnormality. Adrenals/Urinary Tract: No adrenal nodule. Thinning of bilateral renal parenchyma. No hydronephrosis. Small cyst in the left kidney. No significant perinephric edema. No urolithiasis. Urinary bladder is physiologically distended, no bladder wall thickening. Stomach/Bowel: Stomach physiologically distended. No bowel inflammation, wall thickening or obstruction. Enteric contrast reaches the splenic flexure of the colon. Appendix not visualized, no perihilar thecal a right lower quadrant inflammation. No significant diverticular disease. Moderate colonic stool burden. Vascular/Lymphatic: Aortic atherosclerosis. No aneurysm. Small periportal and peripancreatic nodes are likely reactive. No enlarged abdominal or pelvic lymph nodes. Reproductive: Status post hysterectomy. No adnexal masses. Other: No free air, free fluid, or intra-abdominal fluid collection. Small fat containing umbilical hernia. Musculoskeletal: Bones are under mineralized. Fusion of L4-L5. Multilevel degenerative change throughout the lumbar spine. There are no acute or suspicious osseous abnormalities. IMPRESSION:  1. No acute abnormality or explanation for abdominal pain. 2. Moderate colonic stool burden. 3.  Aortic Atherosclerosis (ICD10-I70.0). Electronically Signed   By: Rubye Oaks M.D.   On: 09/13/2017 03:05    Procedures Procedures (including critical care time)  Medications Ordered in ED Medications  Barium Sulfate 2.1 % SUSP (not administered)  sodium chloride 0.9 % bolus 500 mL (0 mLs Intravenous Stopped 09/13/17 0152)    Followed by  0.9 %  sodium chloride infusion ( Intravenous New Bag/Given 09/13/17 0023)     Initial Impression / Assessment and Plan / ED Course  I have reviewed the triage vital signs and the nursing notes.  Pertinent labs & imaging results that were available during my care of the patient were reviewed by me and considered in my  medical decision making (see chart for details).     Patient presents to the emergency department with vague complaints of headache, dizziness, malaise and fatigue.  She has had back pain, but reports a history of arthritis in her spine with chronic back pain.  Her pain today is not much different than her normal pain.  She has, however, been experiencing abdominal pain as well.  No vomiting or diarrhea.  Basic blood work reveals acute kidney injury.  She reports that she has been making urine at home, has not noticed any difference.  CT scan abdomen and pelvis performed.  Patient appears to be moderately constipated, no other acute abnormality.  Patient will require hospitalization for acute kidney injury management.  Final Clinical Impressions(s) / ED Diagnoses   Final diagnoses:  AKI (acute kidney injury) [facility])    ED Discharge Orders    None       Gilda Crease, MD 09/13/17 [phone]

## 2017-09-13 NOTE — Plan of Care (Signed)
Discussed patient care with Dr. Alice Reicherthristopher Paulina. Ms. [name] [name] is a 82 year old female with pmh of dementia, HTN, and arthritis; who presents with vague complaints of abdominal pain, generalized weakness, and dizziness.  Labs revealed creatinine 4.6(previously 1.23 in 01/2013), BUN 85, all other labs relatively within normal limits.  CT scan of the abdomen showed moderate stool burden but no other acute abnormalities to explain patient's symptoms.  I suspect prerenal cause.  Given 500ml IV fluids placed on a rate of 125 mL/h.  Admitted inpatient to a MedSurg bed.

## 2017-09-13 NOTE — Evaluation (Signed)
Physical Therapy Evaluation Patient Details Name: [name] [name] MRN: [id number] DOB: 03-14-1935 Today's Date: 09/13/2017   History of Present Illness  82 yo female was admitted with abd pain, has acute renal failure and hepatic steatosis, dehydration.  PMHx:  HTN, dementia, OA, atherosclerosis  Clinical Impression  Pt was seen for evaluation of mobility with family present to observe and inform PT of PLOF.  Pt is minimally verbal and cannot contribute history due to cognition.  Very pleasant and cooperative but shuffled weak gait is an issue along with a flight to enter house which was previously difficult for her.  Will follow acutely and progress her function as able.  Family plans to continue on with home supervised care when rehab is finished.    Follow Up Recommendations SNF    Equipment Recommendations  Rolling walker with 5" wheels    Recommendations for Other Services       Precautions / Restrictions Precautions Precautions: Fall Restrictions Weight Bearing Restrictions: No      Mobility  Bed Mobility Overal bed mobility: Needs Assistance Bed Mobility: Supine to Sit     Supine to sit: Min assist     General bed mobility comments: supported trunk and final scoot to EOB  Transfers Overall transfer level: Needs assistance Equipment used: Rolling walker (2 wheeled);1 person hand held assist Transfers: Sit to/from Stand Sit to Stand: Min guard;Min assist         General transfer comment: pt is able to support the effort but unfamiliar with walker to stand support  Ambulation/Gait Ambulation/Gait assistance: Min assist Ambulation Distance (Feet): 7 Feet Assistive device: Rolling walker (2 wheeled);1 person hand held assist Gait Pattern/deviations: Step-through pattern;Step-to pattern;Decreased stride length;Trunk flexed;Shuffle;Wide base of support Gait velocity: reduced Gait velocity interpretation: Below normal speed for age/gender General Gait Details:  dense cues for sequence as pt is very unfamiliar with walker  Stairs            Wheelchair Mobility    Modified Rankin (Stroke Patients Only)       Balance Overall balance assessment: Needs assistance Sitting-balance support: Feet supported Sitting balance-Leahy Scale: Fair     Standing balance support: Bilateral upper extremity supported;During functional activity Standing balance-Leahy Scale: Poor                               Pertinent Vitals/Pain Pain Assessment: No/denies pain    Home Living Family/patient expects to be discharged to:: Private residence Living Arrangements: Children Available Help at Discharge: Family;Available 24 hours/day Type of Home: House Home Access: Stairs to enter   Enter[employer] CorporationEntrance Stairs-Number of Steps: 7 Home Layout: One level Home Equipment: None      Prior Function Level of Independence: Needs assistance   Gait / Transfers Assistance Needed: family has been offering HHA  ADL's / Homemaking Assistance Needed: lives with daughter who works and another daughter who is there during the day to help care for her bath, dressing        Hand Dominance        Extremity/Trunk Assessment   Upper Extremity Assessment Upper Extremity Assessment: Generalized weakness    Lower Extremity Assessment Lower Extremity Assessment: Generalized weakness    Cervical / Trunk Assessment Cervical / Trunk Assessment: Kyphotic  Communication   Communication: Other (comment)(limited verbalizations)  Cognition Arousal/Alertness: Awake/alert Behavior During Therapy: Flat affect Overall Cognitive Status: History of cognitive impairments - at baseline  General Comments      Exercises     Assessment/Plan    PT Assessment Patient needs continued PT services  PT Problem List Decreased strength;Decreased range of motion;Decreased activity tolerance;Decreased balance;Decreased  mobility;Decreased coordination;Decreased cognition;Decreased knowledge of use of DME;Decreased safety awareness       PT Treatment Interventions DME instruction;Gait training;Stair training;Functional mobility training;Therapeutic activities;Therapeutic exercise;Balance training;Neuromuscular re-education;Patient/family education    PT Goals (Current goals can be found in the Care Plan section)  Acute Rehab PT Goals Patient Stated Goal: none stated PT Goal Formulation: With family Time For Goal Achievement: 09/27/17 Potential to Achieve Goals: Good    Frequency Min 2X/week   Barriers to discharge Inaccessible home environment 7 stairs to enter house    Co-evaluation               AM-PAC PT "6 Clicks" Daily Activity  Outcome Measure Difficulty turning over in bed (including adjusting bedclothes, sheets and blankets)?: A Little Difficulty moving from lying on back to sitting on the side of the bed? : Unable Difficulty sitting down on and standing up from a chair with arms (e.g., wheelchair, bedside commode, etc,.)?: Unable Help needed moving to and from a bed to chair (including a wheelchair)?: A Little Help needed walking in hospital room?: A Little Help needed climbing 3-5 steps with a railing? : Total 6 Click Score: 12    End of Session Equipment Utilized During Treatment: Gait belt Activity Tolerance: Patient tolerated treatment well;Patient limited by fatigue Patient left: in chair;with call bell/phone within reach;with family/visitor present Nurse Communication: Mobility status PT Visit Diagnosis: Unsteadiness on feet (R26.81);Muscle weakness (generalized) (M62.81)    Time: 1130-1150 PT Time Calculation (min) (ACUTE ONLY): 20 min   Charges:   PT Evaluation $PT Eval Moderate Complexity: 1 Mod     PT G Codes:   PT G-Codes **NOT FOR INPATIENT CLASS** Functional Assessment Tool Used: AM-PAC 6 Clicks Basic Mobility    Ivar DrapeRuth E Icarus Partch 09/13/2017, 1:01 PM   Samul Dadauth  Itali Mckendry, PT MS Acute Rehab Dept. Number: [facility] [id number] [phone] and [facility] [phone]

## 2017-09-13 NOTE — Evaluation (Signed)
Occupational Therapy Evaluation Patient Details Name: [name] GammonSusie W [name] MRN: [id number] DOB: [birth year] Today's Date: 09/13/2017    History of Present Illness 82 yo female was admitted with abd pain, has acute renal failure and hepatic steatosis, dehydration.  PMHx:  HTN, dementia, OA, atherosclerosis   Clinical Impression   Per pts daughter, pt mostly independent with BADL PTA; supervision provided for safety. Currently pt requires mod HHA for functional mobility and min-mod assist for ADL. Recommending SNF for follow up to maximize independence and safety with ADL and functional mobility prior to return home. Pt would benefit from continued skilled OT to address established goals.    Follow Up Recommendations  SNF;Supervision/Assistance - 24 hour    Equipment Recommendations  Other (comment)(TBD at next venue)    Recommendations for Other Services       Precautions / Restrictions Precautions Precautions: Fall Restrictions Weight Bearing Restrictions: No      Mobility Bed Mobility Overal bed mobility: Needs Assistance Bed Mobility: Sit to Supine       Sit to supine: Min guard   General bed mobility comments: Cues for initiation and sequencing. Min guard for safety  Transfers Overall transfer level: Needs assistance Equipment used: 1 person hand held assist Transfers: Sit to/from Stand Sit to Stand: Min assist         General transfer comment: to boost up and for balance    Balance Overall balance assessment: Needs assistance Sitting-balance support: Feet supported;No upper extremity supported Sitting balance-Leahy Scale: Good     Standing balance support: No upper extremity supported;During functional activity Standing balance-Leahy Scale: Poor Standing balance comment: min-mod assist for standing balance                           ADL either performed or assessed with clinical judgement   ADL Overall ADL's : Needs  assistance/impaired Eating/Feeding: Set up;Sitting   Grooming: Moderate assistance;Standing;Wash/dry hands Grooming Details (indicate cue type and reason): Cues for sequencing hand washing Upper Body Bathing: Minimal assistance;Sitting   Lower Body Bathing: Moderate assistance;Sit to/from stand   Upper Body Dressing : Minimal assistance;Sitting   Lower Body Dressing: Moderate assistance;Sit to/from stand   Toilet Transfer: Moderate assistance;Ambulation;Comfort height toilet(HHA)   Toileting- Clothing Manipulation and Hygiene: Minimal assistance;Sit to/from stand Toileting - Clothing Manipulation Details (indicate cue type and reason): for standing balance during peri care     Functional mobility during ADLs: Moderate assistance(HHA)       Vision         Perception     Praxis      Pertinent Vitals/Pain Pain Assessment: No/denies pain     Hand Dominance     Extremity/Trunk Assessment Upper Extremity Assessment Upper Extremity Assessment: Generalized weakness   Lower Extremity Assessment Lower Extremity Assessment: Defer to PT evaluation   Cervical / Trunk Assessment Cervical / Trunk Assessment: Kyphotic   Communication Communication Communication: No difficulties   Cognition Arousal/Alertness: Awake/alert Behavior During Therapy: Flat affect Overall Cognitive Status: History of cognitive impairments - at baseline                                     General Comments       Exercises     Shoulder Instructions      Home Living Family/patient expects to be discharged to:: Private residence Living Arrangements: Children Available Help at Discharge: Family;Available 24  hours/day Type of Home: House Home Access: Stairs to enter [employer] CorporationEntrance Stairs-Number of Steps: 7   Home Layout: One level     Bathroom Shower/Tub: IT trainerTub/shower unit;Curtain   Bathroom Toilet: Standard     Home Equipment: None          Prior Functioning/Environment  Level of Independence: Needs assistance  Gait / Transfers Assistance Needed: family has been offering HHA ADL's / Air traffic controllerHomemaking Assistance Needed: lives with daughter who works and another daughter who is there during the day to help care for her bath, dressing            OT Problem List: Decreased strength;Decreased activity tolerance;Impaired balance (sitting and/or standing);Decreased cognition;Decreased safety awareness;Decreased knowledge of use of DME or AE      OT Treatment/Interventions: Self-care/ADL training;Therapeutic exercise;Energy conservation;DME and/or AE instruction;Therapeutic activities;Patient/family education;Balance training    OT Goals(Current goals can be found in the care plan section) Acute Rehab OT Goals Patient Stated Goal: none stated OT Goal Formulation: With patient/family Time For Goal Achievement: 09/27/17 Potential to Achieve Goals: Good ADL Goals Pt Will Perform Grooming: with min guard assist;standing Pt Will Perform Upper Body Bathing: with min guard assist;sitting Pt Will Perform Lower Body Bathing: with min guard assist;sit to/from stand Pt Will Transfer to Toilet: with min guard assist;ambulating;regular height toilet Pt Will Perform Toileting - Clothing Manipulation and hygiene: with min guard assist;sit to/from stand  OT Frequency: Min 2X/week   Barriers to D/C:            Co-evaluation              AM-PAC PT "6 Clicks" Daily Activity     Outcome Measure Help from another person eating meals?: None Help from another person taking care of personal grooming?: A Lot Help from another person toileting, which includes using toliet, bedpan, or urinal?: A Lot Help from another person bathing (including washing, rinsing, drying)?: A Lot Help from another person to put on and taking off regular upper body clothing?: A Little Help from another person to put on and taking off regular lower body clothing?: A Lot 6 Click Score: 15   End of  Session    Activity Tolerance: Patient tolerated treatment well Patient left: in bed;with call bell/phone within reach;with bed alarm set;with family/visitor present  OT Visit Diagnosis: Unsteadiness on feet (R26.81);Other abnormalities of gait and mobility (R26.89)                Time: [id number] OT Time Calculation (min): 16 min Charges:  OT General Charges $OT Visit: 1 Visit OT Evaluation $OT Eval Moderate Complexity: 1 Mod G-Codes:     Ceara Wrightson A. Brett Albinooffey, M.S., OTR/L Pager: [phone]  Gaye AlkenBailey A Alonie Gazzola 09/13/2017, 5:07 PM

## 2017-09-13 NOTE — H&P (Signed)
History and Physical    [name] [name] ZOX:[id number] DOB: 29-Dec-1934 DOA: 09/12/2017   PCP: Andi Devon, MD   Patient coming from:  Home    Chief Complaint: Generalized weakness, poor oral intake.  HPI: [name] [name] is a 82 y.o. female with medical history significant for dementia, hypertension, osteoarthritis, with chronic back pain, presenting to the ED with 5-day history of generalized malaise, headaches, weakness, decreased oral intake especially over the last 24 hours.  History is obtained by her daughter who is at bedside, as the patient has dementia.  No apparent vomiting, or diarrhea.  She has chronic constipation she reports mild diffuse abdominal discomfort.  The patient has been making urine, and according to her daughter no urinary symptoms such as dysuria, retention or gross hematuria.  Of note, she has been taking significant amount of NSAIDs due to her chronic back pain.  No fever, chills, recent infections, recent sick contacts, or recent history of UTI.  No history of cancer.  No recent weight loss.  ED Course:  BP 126/75 (BP Location: Left Arm)   Pulse 72   Temp (!) 97.5 F (36.4 C) (Oral)   Resp 16   Ht 4\' 11"  (1.499 m)   Wt 75.8 kg (167 lb)   SpO2 100%   BMI 33.73 kg/m    Glucose 139, chloride 95, BUN 85, creatinine 4.6, GFr 9 total protein 8.7, ALT 11, EKG normal sinus rhythm, no significant changes since last tracing. In the ER, the patient received 500 cc bolus of normal saline, with equal output.  She was transferred to 6 [address]. 23, in stable condition.  Review of Systems:  As per HPI otherwise all other systems reviewed and are negative  Past Medical History:  Diagnosis Date  . Arthritis   . Dementia   . Hypertension     Past Surgical History:  Procedure Laterality Date  . ABDOMINAL HYSTERECTOMY    . BACK SURGERY      Social History Social History   Socioeconomic History  . Marital status: Widowed    Spouse name: Not on file  . Number of  children: Not on file  . Years of education: Not on file  . Highest education level: Not on file  Social Needs  . Financial resource strain: Not on file  . Food insecurity - worry: Not on file  . Food insecurity - inability: Not on file  . Transportation needs - medical: Not on file  . Transportation needs - non-medical: Not on file  Occupational History  . Not on file  Tobacco Use  . Smoking status: Former Smoker    Types: Cigarettes    Last attempt to quit: 09/25/1966    Years since quitting: 51.0  . Smokeless tobacco: Never Used  Substance and Sexual Activity  . Alcohol use: No  . Drug use: No  . Sexual activity: No  Other Topics Concern  . Not on file  Social History Narrative  . Not on file     No Known Allergies  History reviewed. No pertinent family history.    Prior to Admission medications   Medication Sig Start Date End Date Taking? Authorizing Provider  acetaminophen-codeine (TYLENOL #3) 300-30 MG tablet Take 1-2 tablets by mouth every 6 (six) hours as needed for moderate pain.    Yes [provider]  atorvastatin (LIPITOR) 20 MG tablet Take 20 mg by mouth daily.   Yes [provider]  Coenzyme Q10 (COQ10) 100 MG CAPS Take  1 tablet by mouth daily.   Yes [provider]  furosemide (LASIX) 40 MG tablet Take 40 mg by mouth 2 (two) times daily.   Yes [provider]  irbesartan (AVAPRO) 150 MG tablet Take 150 mg by mouth daily.   Yes [provider]  meloxicam (MOBIC) 7.5 MG tablet Take 7.5 mg by mouth daily.    Yes [provider]  Memantine HCl-Donepezil HCl (NAMZARIC) 28-10 MG CP24 TAKE 1 CAPSULE(S) EVERY DAY BY ORAL ROUTE. 09/23/16  Yes [provider]  NIFEdipine (PROCARDIA-XL/ADALAT CC) 30 MG 24 hr tablet Take 90 mg by mouth daily.   Yes [provider]  olmesartan-hydrochlorothiazide (BENICAR HCT) 40-25 MG tablet Take 1 tablet by mouth daily.   Yes [provider]    Physical  Exam:  Vitals:   09/13/17 0230 09/13/17 0300 09/13/17 0430 09/13/17 0516  BP: 122/66 122/65 103/64 126/75  Pulse: 65 65 (!) 57 72  Resp: 19 18 15 16   Temp:    (!) 97.5 F (36.4 C)  TempSrc:    Oral  SpO2: 95% 98% 95% 100%  Weight:      Height:       Constitutional: NAD, calm, comfortable, looks younger than her stated age Eyes: PERRL, lids and conjunctivae normal ENMT: Mucous membranes are moist, without exudate or lesions  Neck: normal, supple, no masses, no thyromegaly Respiratory: clear to auscultation bilaterally, no wheezing, no crackles. Normal respiratory effort  Cardiovascular: Regular rate and rhythm, 1 out of 6 murmur, rubs or gallops.  Trace lower bilateral extremity edema. 2+ pedal pulses. No carotid bruits.  Abdomen: Soft, mildly diffuse tenderness, no CVAT  No hepatosplenomegaly. Bowel sounds positive.  Musculoskeletal: no clubbing / cyanosis. Moves all extremities Skin: no jaundice, No lesions.  Neurologic: Sensation intact  Strength equal in all extremities Psychiatric:   Alert and oriented x 1. Normal mood.     Labs on Admission: I have personally reviewed following labs and imaging studies  CBC: Recent Labs  Lab 09/12/17 1933  WBC 6.3  HGB 12.8  HCT 39.9  MCV 86.2  PLT 239    Basic Metabolic Panel: Recent Labs  Lab 09/12/17 1933  NA 136  K 3.7  CL 95*  CO2 28  GLUCOSE 139*  BUN 85*  CREATININE 4.60*  CALCIUM 9.4    GFR: Estimated Creatinine Clearance: 8.7 mL/min (A) (by C-G formula based on SCr of 4.6 mg/dL (H)).  Liver Function Tests: Recent Labs  Lab 09/12/17 1933  AST 21  ALT 11*  ALKPHOS 108  BILITOT 0.7  PROT 8.7*  ALBUMIN 4.2   No results for input(s): LIPASE, AMYLASE in the last 168 hours. No results for input(s): AMMONIA in the last 168 hours.  Coagulation Profile: No results for input(s): INR, PROTIME in the last 168 hours.  Cardiac Enzymes: No results for input(s): CKTOTAL, CKMB, CKMBINDEX, TROPONINI in the last  168 hours.  BNP (last 3 results) No results for input(s): PROBNP in the last 8760 hours.  HbA1C: No results for input(s): HGBA1C in the last 72 hours.  CBG: No results for input(s): GLUCAP in the last 168 hours.  Lipid Profile: No results for input(s): CHOL, HDL, LDLCALC, TRIG, CHOLHDL, LDLDIRECT in the last 72 hours.  Thyroid Function Tests: No results for input(s): TSH, T4TOTAL, FREET4, T3FREE, THYROIDAB in the last 72 hours.  Anemia Panel: No results for input(s): VITAMINB12, FOLATE, FERRITIN, TIBC, IRON, RETICCTPCT in the last 72 hours.  Urine analysis:    Component  Value Date/Time   COLORURINE YELLOW 09/12/2017 1917   APPEARANCEUR CLEAR 09/12/2017 1917   LABSPEC 1.009 09/12/2017 1917   PHURINE 6.0 09/12/2017 1917   GLUCOSEU NEGATIVE 09/12/2017 1917   HGBUR NEGATIVE 09/12/2017 1917   BILIRUBINUR NEGATIVE 09/12/2017 1917   KETONESUR NEGATIVE 09/12/2017 1917   PROTEINUR NEGATIVE 09/12/2017 1917   UROBILINOGEN 0.2 02/09/2013 1849   NITRITE NEGATIVE 09/12/2017 1917   LEUKOCYTESUR NEGATIVE 09/12/2017 1917    Sepsis Labs: @LABRCNTIP (procalcitonin:4,lacticidven:4) )No results found for this or any previous visit (from the past 240 hour(s)).   Radiological Exams on Admission: Ct Abdomen Pelvis Wo Contrast  Result Date: 09/13/2017 CLINICAL DATA:  Abdominal pain.  Fatigue.  Decreased appetite. EXAM: CT ABDOMEN AND PELVIS WITHOUT CONTRAST TECHNIQUE: Multidetector CT imaging of the abdomen and pelvis was performed following the standard protocol without IV contrast. COMPARISON:  CT 06/03/2004 FINDINGS: Lower chest: The lung bases are clear. Hepatobiliary: Mild hepatic steatosis. The left lobe hemangioma on prior exam is not as well visualized, there is vague hypodensity. Gallbladder partially distended, no calcified stone or pericholecystic inflammation. Pancreas: Parenchymal atrophy. No ductal dilatation or inflammation. Spleen: Spleen small in size.  No focal abnormality.  Adrenals/Urinary Tract: No adrenal nodule. Thinning of bilateral renal parenchyma. No hydronephrosis. Small cyst in the left kidney. No significant perinephric edema. No urolithiasis. Urinary bladder is physiologically distended, no bladder wall thickening. Stomach/Bowel: Stomach physiologically distended. No bowel inflammation, wall thickening or obstruction. Enteric contrast reaches the splenic flexure of the colon. Appendix not visualized, no perihilar thecal a right lower quadrant inflammation. No significant diverticular disease. Moderate colonic stool burden. Vascular/Lymphatic: Aortic atherosclerosis. No aneurysm. Small periportal and peripancreatic nodes are likely reactive. No enlarged abdominal or pelvic lymph nodes. Reproductive: Status post hysterectomy. No adnexal masses. Other: No free air, free fluid, or intra-abdominal fluid collection. Small fat containing umbilical hernia. Musculoskeletal: Bones are under mineralized. Fusion of L4-L5. Multilevel degenerative change throughout the lumbar spine. There are no acute or suspicious osseous abnormalities. IMPRESSION: 1. No acute abnormality or explanation for abdominal pain. 2. Moderate colonic stool burden. 3.  Aortic Atherosclerosis (ICD10-I70.0). Electronically Signed   By: Rubye Oaks M.D.   On: 09/13/2017 03:05    EKG: Independently reviewed.  Assessment/Plan Active Problems:   ARF (acute renal failure) (HCC)   Dementia   Essential hypertension   Arthritis   Chronic back pain   Acute Kidney Injury likely due to dehydration due to poor oral intake vs.ACEI.  Urinalysis is negative for UTI.  Patient received 500 cc of normal saline bolus on admission, Maintenance IV fluids.  Current creatinine is 4.6.  Question chronic kidney disease, based on prior labs in 2014 and 2013, when her GFR was in the 40s.  There is no other data available for review. Lab Results  Component Value Date   CREATININE 4.60 (H) 09/12/2017   CREATININE 1.23 (H)  02/09/2013   CREATININE 1.60 (H) 06/05/2012  IVF at 75 cc an hour CMET in am  Hold Ace inhibitors and diuretics, Meloxicam, as well as other NSAIDs Check renal ultrasound. Pending on the results or if Creatinine continues to worsen, will obtain Nephrology consult  Check input and output  Hypertension BP 126/75 Pulse 72    Continue home Procardia, but hold her ACE inhibitor and diuretics for now, due to AK I as above. Add Hydralazine Q8 hours as needed for BP 180  Hyperlipidemia Continue home statins  History of constipation Continue her home laxatives  Chronic back pain, with recent steroid  [facility] in November 2018, [facility], no acute issues at this time. Continue pain control as needed PT OT  History of dementia Continue Aricept     DVT prophylaxis: Heparin Code Status:    Full Family Communication:  Discussed with patient and daughter Disposition Plan: Expect patient to be discharged to home after condition improves Consults called:    None  admission status: Inpatient MedSurg   Marlowe KaysSara Marico Buckle, PA-C [facility]   09/13/2017, 8:28 AM

## 2017-09-14 DIAGNOSIS — N179 Acute kidney failure, unspecified: Principal | ICD-10-CM

## 2017-09-14 LAB — COMPREHENSIVE METABOLIC PANEL
ALT: 10 U/L — ABNORMAL LOW (ref 14–54)
AST: 19 U/L (ref 15–41)
Albumin: 3.2 g/dL — ABNORMAL LOW (ref 3.5–5.0)
Alkaline Phosphatase: 82 U/L (ref 38–126)
Anion gap: 10 (ref 5–15)
BUN: 65 mg/dL — ABNORMAL HIGH (ref 6–20)
CO2: 24 mmol/L (ref 22–32)
Calcium: 8.6 mg/dL — ABNORMAL LOW (ref 8.9–10.3)
Chloride: 104 mmol/L (ref 101–111)
Creatinine, Ser: 2.65 mg/dL — ABNORMAL HIGH (ref 0.44–1.00)
GFR calc Af Amer: 18 mL/min — ABNORMAL LOW (ref >60)
GFR calc non Af Amer: 16 mL/min — ABNORMAL LOW (ref >60)
Glucose, Bld: 97 mg/dL (ref 65–99)
Potassium: 3.3 mmol/L — ABNORMAL LOW (ref 3.5–5.1)
Sodium: 138 mmol/L (ref 135–145)
Total Bilirubin: 0.9 mg/dL (ref 0.3–1.2)
Total Protein: 6.9 g/dL (ref 6.5–8.1)

## 2017-09-14 LAB — CBC
HCT: 33.6 % — ABNORMAL LOW (ref 36.0–46.0)
Hemoglobin: 10.7 g/dL — ABNORMAL LOW (ref 12.0–15.0)
MCH: 27.4 pg (ref 26.0–34.0)
MCHC: 31.8 g/dL (ref 30.0–36.0)
MCV: 86.2 fL (ref 78.0–100.0)
Platelets: 194 10*3/uL (ref 150–400)
RBC: 3.9 MIL/uL (ref 3.87–5.11)
RDW: 14.8 % (ref 11.5–15.5)
WBC: 5.7 10*3/uL (ref 4.0–10.5)

## 2017-09-14 MED ORDER — POTASSIUM CHLORIDE CRYS ER 20 MEQ PO TBCR
40.0000 meq | EXTENDED_RELEASE_TABLET | Freq: Once | ORAL | Status: AC
  Administered 2017-09-14: 40 meq via ORAL
  Filled 2017-09-14: qty 2

## 2017-09-14 MED ORDER — SODIUM CHLORIDE 0.9 % IV SOLN
INTRAVENOUS | Status: DC
  Administered 2017-09-14 – 2017-09-15 (×3): via INTRAVENOUS

## 2017-09-14 MED ORDER — ENSURE ENLIVE PO LIQD
237.0000 mL | Freq: Two times a day (BID) | ORAL | Status: DC
  Administered 2017-09-14: 237 mL via ORAL

## 2017-09-14 MED ORDER — BOOST / RESOURCE BREEZE PO LIQD CUSTOM
1.0000 | Freq: Two times a day (BID) | ORAL | Status: DC
  Administered 2017-09-15: 1 via ORAL

## 2017-09-14 NOTE — Progress Notes (Signed)
PROGRESS NOTE    [name] GammonSusie W [name]  YNW:[id number] DOB: 11-Apr-1935 DOA: 09/12/2017 PCP: Andi DevonShelton, Kimberly, MD     Brief Narrative:  [name] GammonSusie W [name] is a 82 y.o. female with medical history significant for dementia, hypertension, osteoarthritis, with chronic back pain, presenting to the ED with 5-day history of generalized malaise, headaches, weakness, decreased oral intake especially over the last 24 hours. History is obtained by her daughter who is at bedside, as the patient has dementia. Patient had no specific complaints, just had no appetite and decreased PO intake. Of note, she has been taking significant amount of NSAIDs due to her chronic back pain. She was found to have AKI on admission.   Assessment & Plan:   Active Problems:   ARF (acute renal failure) (HCC)   Dementia   Essential hypertension   Arthritis   Chronic back pain   Acute Kidney Injury -Due to dehydration, decreased PO intake as well as multiple medications such as ARB, NSAID, diuretics.  -Renal US: no hydronephrosis -UA negative for infection  -Improving with IVF, trend BMP. Encourage PO intake   Hypertension -Continue home Procardia, but hold her ACE inhibitor and diuretics for now, due to AKI  Hyperlipidemia -Continue statin  Chronic back pain -With recent steroid injection in November 2018, [facility], no acute issues at this time -PT OT  Dementia -Continue Aricept  Hypokalemia -Replace, trend    DVT prophylaxis: subq hep Code Status: full Family Communication: daughter at bedside Disposition Plan: pending improvement, home vs SNF   Consultants:   None  Procedures:   None   Antimicrobials:  Anti-infectives (From admission, onward)   None        Subjective: No ROS due to dementia, no complaints   Objective: Vitals:   09/13/17 1329 09/13/17 2127 09/14/17 0500 09/14/17 0521  BP: 131/69 (!) 145/68  133/72  Pulse: (!) 58 62  63  Resp: 14 16  16   Temp: 97.9 F  (36.6 C) 98.1 F (36.7 C)  97.7 F (36.5 C)  TempSrc: Oral Oral  Oral  SpO2: 100% 99%  97%  Weight:   76.7 kg (169 lb 1.5 oz)   Height:        Intake/Output Summary (Last 24 hours) at 09/14/2017 1415 Last data filed at 09/14/2017 0916 Gross per 24 hour  Intake 2222.5 ml  Output 500 ml  Net 1722.5 ml   Filed Weights   09/12/17 1848 09/14/17 0500  Weight: 75.8 kg (167 lb) 76.7 kg (169 lb 1.5 oz)    Examination:  General exam: Appears calm and comfortable, sitting in chair about to eat breakfast  Respiratory system: Clear to auscultation. Respiratory effort normal. Cardiovascular system: S1 & S2 heard, RRR. No JVD, murmurs, rubs, gallops or clicks. No pedal edema. Gastrointestinal system: Abdomen is nondistended, soft and nontender. No organomegaly or masses felt. Normal bowel sounds heard. Central nervous system: Alert. No focal neurological deficits. Extremities: Symmetric 5 x 5 power. Skin: No rashes, lesions or ulcers Psychiatry: +Dementia   Data Reviewed: I have personally reviewed following labs and imaging studies  CBC: Recent Labs  Lab 09/12/17 1933 09/14/17 0546  WBC 6.3 5.7  HGB 12.8 10.7*  HCT 39.9 33.6*  MCV 86.2 86.2  PLT 239 194   Basic Metabolic Panel: Recent Labs  Lab 09/12/17 1933 09/14/17 0546  NA 136 138  K 3.7 3.3*  CL 95* 104  CO2 28 24  GLUCOSE 139* 97  BUN 85* 65*  CREATININE 4.60*  2.65*  CALCIUM 9.4 8.6*   GFR: Estimated Creatinine Clearance: 15.1 mL/min (A) (by C-G formula based on SCr of 2.65 mg/dL (H)). Liver Function Tests: Recent Labs  Lab 09/12/17 1933 09/14/17 0546  AST 21 19  ALT 11* 10*  ALKPHOS 108 82  BILITOT 0.7 0.9  PROT 8.7* 6.9  ALBUMIN 4.2 3.2*   No results for input(s): LIPASE, AMYLASE in the last 168 hours. No results for input(s): AMMONIA in the last 168 hours. Coagulation Profile: No results for input(s): INR, PROTIME in the last 168 hours. Cardiac Enzymes: No results for input(s): CKTOTAL, CKMB,  CKMBINDEX, TROPONINI in the last 168 hours. BNP (last 3 results) No results for input(s): PROBNP in the last 8760 hours. HbA1C: No results for input(s): HGBA1C in the last 72 hours. CBG: No results for input(s): GLUCAP in the last 168 hours. Lipid Profile: No results for input(s): CHOL, HDL, LDLCALC, TRIG, CHOLHDL, LDLDIRECT in the last 72 hours. Thyroid Function Tests: No results for input(s): TSH, T4TOTAL, FREET4, T3FREE, THYROIDAB in the last 72 hours. Anemia Panel: No results for input(s): VITAMINB12, FOLATE, FERRITIN, TIBC, IRON, RETICCTPCT in the last 72 hours. Sepsis Labs: No results for input(s): PROCALCITON, LATICACIDVEN in the last 168 hours.  No results found for this or any previous visit (from the past 240 hour(s)).     Radiology Studies: Ct Abdomen Pelvis Wo Contrast  Result Date: 09/13/2017 CLINICAL DATA:  Abdominal pain.  Fatigue.  Decreased appetite. EXAM: CT ABDOMEN AND PELVIS WITHOUT CONTRAST TECHNIQUE: Multidetector CT imaging of the abdomen and pelvis was performed following the standard protocol without IV contrast. COMPARISON:  CT 06/03/2004 FINDINGS: Lower chest: The lung bases are clear. Hepatobiliary: Mild hepatic steatosis. The left lobe hemangioma on prior exam is not as well visualized, there is vague hypodensity. Gallbladder partially distended, no calcified stone or pericholecystic inflammation. Pancreas: Parenchymal atrophy. No ductal dilatation or inflammation. Spleen: Spleen small in size.  No focal abnormality. Adrenals/Urinary Tract: No adrenal nodule. Thinning of bilateral renal parenchyma. No hydronephrosis. Small cyst in the left kidney. No significant perinephric edema. No urolithiasis. Urinary bladder is physiologically distended, no bladder wall thickening. Stomach/Bowel: Stomach physiologically distended. No bowel inflammation, wall thickening or obstruction. Enteric contrast reaches the splenic flexure of the colon. Appendix not visualized, no  perihilar thecal a right lower quadrant inflammation. No significant diverticular disease. Moderate colonic stool burden. Vascular/Lymphatic: Aortic atherosclerosis. No aneurysm. Small periportal and peripancreatic nodes are likely reactive. No enlarged abdominal or pelvic lymph nodes. Reproductive: Status post hysterectomy. No adnexal masses. Other: No free air, free fluid, or intra-abdominal fluid collection. Small fat containing umbilical hernia. Musculoskeletal: Bones are under mineralized. Fusion of L4-L5. Multilevel degenerative change throughout the lumbar spine. There are no acute or suspicious osseous abnormalities. IMPRESSION: 1. No acute abnormality or explanation for abdominal pain. 2. Moderate colonic stool burden. 3.  Aortic Atherosclerosis (ICD10-I70.0). Electronically Signed   By: Rubye OaksMelanie  Ehinger M.D.   On: 09/13/2017 03:05   [place] Renal  Result Date: 09/13/2017 CLINICAL DATA:  Acute renal insufficiency EXAM: RENAL / URINARY TRACT ULTRASOUND COMPLETE COMPARISON:  Abdominal and pelvic CT scan dated December 2028 teen FINDINGS: Right Kidney: Length: 10.7 cm. The renal cortical echotexture is approximately equal to that of the liver. There is mild cortical thinning. There is no hydronephrosis nor cystic or solid mass. Left Kidney: Length: 10 cm. The renal cortical echotexture is increased similar to that on the right. There is no hydronephrosis. There is a mid to lower pole cortical cyst  laterally measuring 1.4 cm in greatest dimension. Bladder: The partially distended urinary bladder is grossly normal. IMPRESSION: Increased renal cortical echotexture and mild cortical thinning consistent with medical renal disease. No hydronephrosis. Simple appearing lower pole cortical cyst on the left. Electronically Signed   By: David  [place] M.D.   On: 09/13/2017 09:52      Scheduled Meds: . atorvastatin  20 mg Oral Daily  . donepezil  10 mg Oral QHS  . heparin  5,000 Units Subcutaneous Q8H  . memantine   28 mg Oral Daily  . NIFEdipine  90 mg Oral Daily  . senna-docusate  1 tablet Oral BID   Continuous Infusions: . sodium chloride 100 mL/hr at 09/14/17 0851     LOS: 1 day    Time spent: 40 minutes   Noralee Stain, DO [facility] [email or web address] Password [email or web address] 09/14/2017, 2:15 PM

## 2017-09-14 NOTE — Social Work (Signed)
CSW spoke with pt & pt daughter Debarah CrapeClaudia. Pt is confused but pleasant.  CSW mentioned SNF recommendations from PT/OT. Pt daughter said she provides 24/7 support at the current time. Pt daughter wanted to speak with sister about SNF vs home at discharge.   CSW will return and speak with pt daughter later today.  Doy HutchingIsabel H Junice Fei, LCSWA [facility] Clinical Social Work [phone]

## 2017-09-14 NOTE — Progress Notes (Addendum)
Initial Nutrition Assessment  DOCUMENTATION CODES:  Obesity unspecified  INTERVENTION:  Ensure Enlive po BID, each supplement provides 350 kcal and 20 grams of protein  Boost Breeze po BID, each supplement provides 250 kcal and 9 grams of protein  Vital Cuisine II BID (@L +D), each supplement provides 480-500 kcals and 20-23 grams of protein  Specific food items the patient has consumed recently are added to meal trays  Question if patient would be appropriate for an appetite stimulant   NUTRITION DIAGNOSIS:  Inadequate oral intake related to poor appetite as a result of acute illness (AKI) vs chronic illness/dementia progression as evidenced by per patient/family report and estimated that patient has been eating </= to 50% of needs for >/= to 5 days.   GOAL:  Patient will meet greater than or equal to 90% of their needs  MONITOR:  PO intake, Supplement acceptance, Diet advancement, Weight trends, I & O's, Labs  REASON FOR ASSESSMENT:  Consult Assessment of nutrition requirement/status, Poor PO  ASSESSMENT:  82 y/o female PMHx Dementia, HTN/HLD, OA, chronic back pain. Presented to RD w/ 5 days of malaise, HA, weakness, poor oral intake, especially over last 24 hours. Worked up for AKI and admitted for management. RD consulted to assess nutritional status and to assist w/ PO intake.   Spoke with 2x family members who are the caregivers for the patient. They report that up until a couple weeks ago, the patient would eat "real good". They define this as 3 "good sized" meals/day. The patient did not consume any nutritional supplements at home. She has chronic constipation that was managed well with medicinal type of green tea, however, recently this also stopped having an effect.   Caregivers say the patient UBW is 160-167 lbs. Both of them do NOT feel the patient has lost any weight from this episode of poor po intake and neither of them believe the patient looks any different.   RD  attempted to discuss possible intervention. Patient apparently consumed a Breeze supplement well that was given by her nurse yesterday. They also thought the patient may do well with regular Boost/Ensure.   As fas as foods go, the caregivers could offer very little in terms of foods the patient would eat. They both said there is extremely little the patient will consume now. One mentions she was feeding the patient soup slightly before admission. They also mention the pt would eat yogurt.   Both family members have not noticed the patient eating any better with rehydration.   If pt's abysmal appetite continues after her AKI has resolved, likely more related to dementia. Patient is a full code. RD mentioned appetite stimulants may be a potential route to pursue and they could discuss this with MD and whether or not patient would be a candidate.  Physical Exam: Obese, no discernible areas of wasting. Bed weight 170  Labs: BUN/Creat from  85/4.6 to 65/2.65, Albumin:3.2, K: 3.3,  Meds: Aricept/Namenda, Senna, IVF  Recent Labs  Lab 09/12/17 1933 09/14/17 0546  NA 136 138  K 3.7 3.3*  CL 95* 104  CO2 28 24  BUN 85* 65*  CREATININE 4.60* 2.65*  CALCIUM 9.4 8.6*  GLUCOSE 139* 97   NUTRITION - FOCUSED PHYSICAL EXAM: Obese, WDL  Diet Order:  Diet regular Room service appropriate? Yes; Fluid consistency: Thin  EDUCATION NEEDS:  No education needs have been identified at this time  Skin: Unknown  Last BM:  12/20  Height:  Ht Readings from Last 1  Encounters:  09/12/17 4\' 11"  (1.499 m)   Weight:  Wt Readings from Last 1 Encounters:  09/14/17 169 lb 1.5 oz (76.7 kg)   Ideal Body Weight:  44.7 kg  BMI:  Body mass index is 34.15 kg/m.  Estimated Nutritional Needs:  Kcal:  1300-1550 kcals (17-20 kcal/kg bw) Protein:  55-65g Pro (1.2-1.5g/kg ibw) Fluid:  1.3-1.6 L fluid ( 641ml/kcal)  Christophe LouisNathan Byrant Valent RD, LDN, CNSC Clinical Nutrition Pager: [phone] 09/14/2017 4:44 PM

## 2017-09-15 LAB — BASIC METABOLIC PANEL
Anion gap: 7 (ref 5–15)
BUN: 43 mg/dL — ABNORMAL HIGH (ref 6–20)
CO2: 23 mmol/L (ref 22–32)
Calcium: 8.6 mg/dL — ABNORMAL LOW (ref 8.9–10.3)
Chloride: 111 mmol/L (ref 101–111)
Creatinine, Ser: 1.93 mg/dL — ABNORMAL HIGH (ref 0.44–1.00)
GFR calc Af Amer: 27 mL/min — ABNORMAL LOW (ref >60)
GFR calc non Af Amer: 23 mL/min — ABNORMAL LOW (ref >60)
Glucose, Bld: 154 mg/dL — ABNORMAL HIGH (ref 65–99)
Potassium: 3.5 mmol/L (ref 3.5–5.1)
Sodium: 141 mmol/L (ref 135–145)

## 2017-09-15 LAB — MAGNESIUM: Magnesium: 2.3 mg/dL (ref 1.7–2.4)

## 2017-09-15 MED ORDER — ENSURE ENLIVE PO LIQD: 237.0000 mL | Freq: Two times a day (BID) | ORAL | 12 refills | Status: AC

## 2017-09-15 NOTE — Progress Notes (Signed)
Right arm

## 2017-09-15 NOTE — Progress Notes (Signed)
Patient was given discharge instructions and verbalized understanding.Patient made aware of follow up appointments. Patient left unit in stable condition via wheelchair.

## 2017-09-15 NOTE — Care Management Note (Signed)
Case Management Note  Patient Details  Name: [name] GammonSusie W Hallum MRN: [id number] Date of Birth: April 24, 1935  Subjective/Objective:                 Spoke w patient and family at the bedside. [facility], verbalized needing 3/1 and RW. [facility] and D[facility] for DC today. No other CM needs identified.    Action/Plan:   Expected Discharge Date:  09/15/17               Expected Discharge Plan:  Home w Home Health Services  In-House Referral:     Discharge planning Services  CM Consult  Post Acute Care Choice:  Home Health, Durable Medical Equipment Choice offered to:  Patient, Adult Children  DME Arranged:  3-N-1, Walker rolling DME Agency:  [facility]  HH Arranged:  PT, OT HH Agency:  [facility]  Status of Service:  Completed, signed off  If discussed at Long Length of Stay Meetings, dates discussed:    Additional Comments:  Lawerance SabalDebbie Lynnetta Tom, RN 09/15/2017, 11:11 AM

## 2017-09-15 NOTE — Discharge Summary (Signed)
Physician Discharge Summary  [name] [name] ZOX:[id number] DOB: 10/23/1934 DOA: 09/12/2017  PCP: Andi Devon, MD  Admit date: 09/12/2017 Discharge date: 09/15/2017  Admitted From: Home Disposition:  Home, declined SNF   Recommendations for Outpatient Follow-up:  1. Follow up with PCP in 1 week 2. Please obtain BMP in 1 week  3. Patient was apparently on lasix, irbesartan, mobic, olmesartan, HCTZ at home. Stop these medications for now due to AKI. May repeat labs and reintroduce antihypertensives slowly as outpatient. Would recommend against dual ARB therapy.   Home Health: PT OT   Equipment/Devices: Rolling walker    Discharge Condition: Stable CODE STATUS: Full  Diet recommendation: Regular diet   Brief/Interim Summary: [name] Sterbenz McCrayis a 82 y.o.femalewith medical history significant for dementia, hypertension, osteoarthritis, with chronic back pain, presenting to the ED with 5-day history of generalized malaise, headaches, weakness, decreased oral intake especially over the last 24 hours.History is obtained by her daughter who is at bedside, as the patient has dementia. Patient had no specific complaints, just had no appetite and decreased PO intake.Of note, she has been taking significant amount of NSAIDs due to her chronic back pain. She was found to have AKI on admission. Patient's antihypertensives were held on admission and was given IVF. Cr continued to improve with supportive care. She improved her oral intake and was consuming >90% of her meals prior to discharge home.   Discharge Diagnoses:  Active Problems:   ARF (acute renal failure) (HCC)   Dementia   Essential hypertension   Arthritis   Chronic back pain   Acute Kidney Injury -Due to dehydration, decreased PO intake as well as multiple medications such as ARB, NSAID, diuretics.  -Renal US: no hydronephrosis -UA negative for infection  -Improving with IVF, trend BMP. Encourage PO intake. Monitor BMP as  outpatient.   Hypertension -Continue home Procardia, but hold her ARB and diuretics for now, due to AKI. Patient was apparently on lasix, irbesartan, mobic, olmesartan, HCTZ at home.   Hyperlipidemia -Continue statin  Chronic back pain -With recent steroid injection in November 2018, followed at [facility], no acute issues at this time -PT OT  Dementia -Continue Namzaric    Discharge Instructions  Discharge Instructions    Call MD for:  difficulty breathing, headache or visual disturbances   Complete by:  As directed    Call MD for:  extreme fatigue   Complete by:  As directed    Call MD for:  persistant dizziness or light-headedness   Complete by:  As directed    Call MD for:  persistant nausea and vomiting   Complete by:  As directed    Call MD for:  severe uncontrolled pain   Complete by:  As directed    Call MD for:  temperature >100.4   Complete by:  As directed    Diet general   Complete by:  As directed    Discharge instructions   Complete by:  As directed    You were cared for by a hospitalist during your hospital stay. If you have any questions about your discharge medications or the care you received while you were in the hospital after you are discharged, you can call the unit and asked to speak with the hospitalist on call if the hospitalist that took care of you is not available. Once you are discharged, your primary care physician will handle any further medical issues. Please note that NO REFILLS for any discharge medications will be authorized  once you are discharged, as it is imperative that you return to your primary care physician (or establish a relationship with a primary care physician if you do not have one) for your aftercare needs so that they can reassess your need for medications and monitor your lab values.   Increase activity slowly   Complete by:  As directed      Allergies as of 09/15/2017   No Known Allergies     Medication List     STOP taking these medications   furosemide 40 MG tablet Commonly known as:  LASIX   irbesartan 150 MG tablet Commonly known as:  AVAPRO   meloxicam 7.5 MG tablet Commonly known as:  MOBIC   olmesartan-hydrochlorothiazide 40-25 MG tablet Commonly known as:  BENICAR HCT     TAKE these medications   acetaminophen-codeine 300-30 MG tablet Commonly known as:  TYLENOL #3 Take 1-2 tablets by mouth every 6 (six) hours as needed for moderate pain.   atorvastatin 20 MG tablet Commonly known as:  LIPITOR Take 20 mg by mouth daily.   CoQ10 100 MG Caps Take 1 tablet by mouth daily.   feeding supplement (ENSURE ENLIVE) Liqd Take 237 mLs by mouth 2 (two) times daily between meals.   NAMZARIC 28-10 MG Cp24 Generic drug:  Memantine HCl-Donepezil HCl TAKE 1 CAPSULE(S) EVERY DAY BY ORAL ROUTE.   NIFEdipine 30 MG 24 hr tablet Commonly known as:  PROCARDIA-XL/ADALAT CC Take 90 mg by mouth daily.            Durable Medical Equipment  (From admission, onward)        Start     Ordered   09/15/17 0939  For home use only DME Walker rolling  Once    Question:  Patient needs a walker to treat with the following condition  Answer:  Weakness   09/15/17 0938      No Known Allergies  Consultations:  None   Procedures/Studies: Ct Abdomen Pelvis Wo Contrast  Result Date: 09/13/2017 CLINICAL DATA:  Abdominal pain.  Fatigue.  Decreased appetite. EXAM: CT ABDOMEN AND PELVIS WITHOUT CONTRAST TECHNIQUE: Multidetector CT imaging of the abdomen and pelvis was performed following the standard protocol without IV contrast. COMPARISON:  CT 06/03/2004 FINDINGS: Lower chest: The lung bases are clear. Hepatobiliary: Mild hepatic steatosis. The left lobe hemangioma on prior exam is not as well visualized, there is vague hypodensity. Gallbladder partially distended, no calcified stone or pericholecystic inflammation. Pancreas: Parenchymal atrophy. No ductal dilatation or inflammation. Spleen:  Spleen small in size.  No focal abnormality. Adrenals/Urinary Tract: No adrenal nodule. Thinning of bilateral renal parenchyma. No hydronephrosis. Small cyst in the left kidney. No significant perinephric edema. No urolithiasis. Urinary bladder is physiologically distended, no bladder wall thickening. Stomach/Bowel: Stomach physiologically distended. No bowel inflammation, wall thickening or obstruction. Enteric contrast reaches the splenic flexure of the colon. Appendix not visualized, no perihilar thecal a right lower quadrant inflammation. No significant diverticular disease. Moderate colonic stool burden. Vascular/Lymphatic: Aortic atherosclerosis. No aneurysm. Small periportal and peripancreatic nodes are likely reactive. No enlarged abdominal or pelvic lymph nodes. Reproductive: Status post hysterectomy. No adnexal masses. Other: No free air, free fluid, or intra-abdominal fluid collection. Small fat containing umbilical hernia. Musculoskeletal: Bones are under mineralized. Fusion of L4-L5. Multilevel degenerative change throughout the lumbar spine. There are no acute or suspicious osseous abnormalities. IMPRESSION: 1. No acute abnormality or explanation for abdominal pain. 2. Moderate colonic stool burden. 3.  Aortic Atherosclerosis (ICD10-I70.0). Electronically Signed  By: Rubye Oaks M.D.   On: 09/13/2017 03:05   US Renal  Result Date: 09/13/2017 CLINICAL DATA:  Acute renal insufficiency EXAM: RENAL / URINARY TRACT ULTRASOUND COMPLETE COMPARISON:  Abdominal and pelvic CT scan dated December 2028 teen FINDINGS: Right Kidney: Length: 10.7 cm. The renal cortical echotexture is approximately equal to that of the liver. There is mild cortical thinning. There is no hydronephrosis nor cystic or solid mass. Left Kidney: Length: 10 cm. The renal cortical echotexture is increased similar to that on the right. There is no hydronephrosis. There is a mid to lower pole cortical cyst laterally measuring 1.4 cm  in greatest dimension. Bladder: The partially distended urinary bladder is grossly normal. IMPRESSION: Increased renal cortical echotexture and mild cortical thinning consistent with medical renal disease. No hydronephrosis. Simple appearing lower pole cortical cyst on the left. Electronically Signed   By: David  [place] M.D.   On: 09/13/2017 09:52       Discharge Exam: Vitals:   09/14/17 2145 09/15/17 0547  BP: (!) 155/66 (!) 152/66  Pulse: 69 66  Resp: 16 18  Temp: 97.7 F (36.5 C) 97.6 F (36.4 C)  SpO2: 100% 99%   Vitals:   09/14/17 1550 09/14/17 2145 09/15/17 0500 09/15/17 0547  BP: 137/61 (!) 155/66  (!) 152/66  Pulse: 69 69  66  Resp: 16 16  18   Temp: 97.7 F (36.5 C) 97.7 F (36.5 C)  97.6 F (36.4 C)  TempSrc: Oral Oral  Oral  SpO2: 100% 100%  99%  Weight:   78.8 kg (173 lb 11.6 oz)   Height:        General: Pt is alert, awake, not in acute distress Cardiovascular: RRR, S1/S2 +, no rubs, no gallops Respiratory: CTA bilaterally, no wheezing, no rhonchi Abdominal: Soft, NT, ND, bowel sounds + Extremities: no edema, no cyanosis    The results of significant diagnostics from this hospitalization (including imaging, microbiology, ancillary and laboratory) are listed below for reference.     Microbiology: No results found for this or any previous visit (from the past 240 hour(s)).   Labs: BNP (last 3 results) No results for input(s): BNP in the last 8760 hours. Basic Metabolic Panel: Recent Labs  Lab 09/12/17 1933 09/14/17 0546 09/15/17 0659  NA 136 138 141  K 3.7 3.3* 3.5  CL 95* 104 111  CO2 28 24 23   GLUCOSE 139* 97 154*  BUN 85* 65* 43*  CREATININE 4.60* 2.65* 1.93*  CALCIUM 9.4 8.6* 8.6*  MG  --   --  2.3   Liver Function Tests: Recent Labs  Lab 09/12/17 1933 09/14/17 0546  AST 21 19  ALT 11* 10*  ALKPHOS 108 82  BILITOT 0.7 0.9  PROT 8.7* 6.9  ALBUMIN 4.2 3.2*   No results for input(s): LIPASE, AMYLASE in the last 168 hours. No  results for input(s): AMMONIA in the last 168 hours. CBC: Recent Labs  Lab 09/12/17 1933 09/14/17 0546  WBC 6.3 5.7  HGB 12.8 10.7*  HCT 39.9 33.6*  MCV 86.2 86.2  PLT 239 194   Cardiac Enzymes: No results for input(s): CKTOTAL, CKMB, CKMBINDEX, TROPONINI in the last 168 hours. BNP: Invalid input(s): POCBNP CBG: No results for input(s): GLUCAP in the last 168 hours. D-Dimer No results for input(s): DDIMER in the last 72 hours. Hgb A1c No results for input(s): HGBA1C in the last 72 hours. Lipid Profile No results for input(s): CHOL, HDL, LDLCALC, TRIG, CHOLHDL, LDLDIRECT in the last 72  hours. Thyroid function studies No results for input(s): TSH, T4TOTAL, T3FREE, THYROIDAB in the last 72 hours.  Invalid input(s): FREET3 Anemia work up No results for input(s): VITAMINB12, FOLATE, FERRITIN, TIBC, IRON, RETICCTPCT in the last 72 hours. Urinalysis    Component Value Date/Time   COLORURINE YELLOW 09/12/2017 1917   APPEARANCEUR CLEAR 09/12/2017 1917   LABSPEC 1.009 09/12/2017 1917   PHURINE 6.0 09/12/2017 1917   GLUCOSEU NEGATIVE 09/12/2017 1917   HGBUR NEGATIVE 09/12/2017 1917   BILIRUBINUR NEGATIVE 09/12/2017 1917   KETONESUR NEGATIVE 09/12/2017 1917   PROTEINUR NEGATIVE 09/12/2017 1917   UROBILINOGEN 0.2 02/09/2013 1849   NITRITE NEGATIVE 09/12/2017 1917   LEUKOCYTESUR NEGATIVE 09/12/2017 1917   Sepsis Labs Invalid input(s): PROCALCITONIN,  WBC,  LACTICIDVEN Microbiology No results found for this or any previous visit (from the past 240 hour(s)).   Time coordinating discharge: 30 minutes  SIGNED:  Noralee StainJennifer Dawnette Mione, DO [facility] Pager [phone]  If 7PM-7AM, please contact night-coverage [email or web address] Password [email or web address] 09/15/2017, 9:42 AM

## 2017-10-11 ENCOUNTER — Ambulatory Visit
Admission: RE | Admit: 2017-10-11 | Discharge: 2017-10-11 | Disposition: A | Discharge status: Home / Self Care | Payer: Medicare Other | Source: Ambulatory Visit | Attending: Internal Medicine | Admitting: Internal Medicine

## 2017-10-11 ENCOUNTER — Other Ambulatory Visit: Payer: Self-pay | Admitting: Internal Medicine

## 2017-10-11 DIAGNOSIS — M79605 Pain in left leg: Secondary | ICD-10-CM

## 2017-11-07 ENCOUNTER — Other Ambulatory Visit: Payer: Self-pay

## 2017-11-07 ENCOUNTER — Encounter: Payer: Self-pay | Admitting: Vascular Surgery

## 2017-11-07 ENCOUNTER — Ambulatory Visit: Payer: Medicare Other | Admitting: Vascular Surgery

## 2017-11-07 VITALS — BP 153/84 | HR 76 | Temp 97.6°F | Resp 20 | Ht 59.0 in | Wt 156.0 lb

## 2017-11-07 DIAGNOSIS — I82412 Acute embolism and thrombosis of left femoral vein: Secondary | ICD-10-CM

## 2017-11-07 NOTE — Progress Notes (Signed)
Patient ID: [name] [name], female   DOB: [birth year], 83 y.o.   MRN: [id number]  Reason for Consult: New Patient (Initial Visit) (Ref by Dr. Andi Devon [phone].  DVT.  (U/S at [facility] imaging 10/11/17).)   Referred by Andi Devon, MD  Subjective:     HPI:  [name] [name] is a 83 y.o. female without a history of known DVT.  She does have a history of dementia.  She has no family history of DVT that she knows about.  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  Since that time her swelling has improved.  She has been seen by her primary care doctor who referred her here for further evaluation.  She has never had any venous interventions.  She does not wear compression stockings.  She is currently taking Eliquis.  She does not take any antiplatelets or other blood thinners.  She did lose 2 children prior to childbirth but had 5 successful pregnancies.  She is a former smoker but did quit many years ago.  She walks with the help of a walker.  She is not really a fall risk but does have one fall in the recent past.  Past Medical History:  Diagnosis Date  . Arthritis   . Dementia   . Hypertension    No family history on file. Past Surgical History:  Procedure Laterality Date  . ABDOMINAL HYSTERECTOMY    . BACK SURGERY      Short Social History:  Social History   Tobacco Use  . Smoking status: Former Smoker    Types: Cigarettes    Last attempt to quit: 09/25/1966    Years since quitting: 51.1  . Smokeless tobacco: Never Used  Substance Use Topics  . Alcohol use: No    No Known Allergies  Current Outpatient Medications  Medication Sig Dispense Refill  . acetaminophen-codeine (TYLENOL #3) 300-30 MG tablet Take 1-2 tablets by mouth every 6 (six) hours as needed for moderate pain.     Marland Kitchen atorvastatin (LIPITOR) 20 MG tablet Take 20 mg by mouth daily.    . Coenzyme Q10  (COQ10) 100 MG CAPS Take 1 tablet by mouth daily.    Marland Kitchen ELIQUIS 2.5 MG TABS tablet Take 2.5 mg by mouth 2 (two) times daily.  2  . feeding supplement, ENSURE ENLIVE, (ENSURE ENLIVE) LIQD Take 237 mLs by mouth 2 (two) times daily between meals. 237 mL 12  . Memantine HCl-Donepezil HCl (NAMZARIC) 28-10 MG CP24 TAKE 1 CAPSULE(S) EVERY DAY BY ORAL ROUTE.    Marland Kitchen NIFEdipine (PROCARDIA-XL/ADALAT CC) 30 MG 24 hr tablet Take 90 mg by mouth daily.    Marland Kitchen oseltamivir (TAMIFLU) 75 MG capsule Take 75 mg by mouth.     No current facility-administered medications for this visit.     Review of Systems  Constitutional:  Constitutional negative. HENT: HENT negative.  Eyes: Eyes negative.  Respiratory: Respiratory negative.  Cardiovascular: Positive for leg swelling.  GI: Gastrointestinal negative.  Musculoskeletal: Positive for leg pain.  Skin: Skin negative.  Neurological: Neurological negative. Hematologic: Hematologic/lymphatic negative.  Psychiatric: Positive for confusion.        Objective:  Objective   Vitals:   11/07/17 1007  BP: (!) 153/84  Pulse: 76  Resp: 20  Temp: 97.6 F (36.4 C)  TempSrc: Oral  SpO2: 100%  Weight: 156 lb (70.8 kg)  Height: 4\' 11"  (1.499 m)  Body mass index is 31.51 kg/m.  Physical Exam  Constitutional: She appears well-developed.  HENT:  Head: Normocephalic.  Eyes: Pupils are equal, round, and reactive to light.  Neck: Normal range of motion.  Cardiovascular: Normal rate.  Pulses:      Radial pulses are 2+ on the right side, and 2+ on the left side.       Femoral pulses are 2+ on the right side, and 2+ on the left side.      Popliteal pulses are 2+ on the right side, and 2+ on the left side.  Pulmonary/Chest: Effort normal.  Abdominal: Soft. She exhibits no mass.  Musculoskeletal: She exhibits edema.  Right leg 33cm Left leg 39cm  (measured 10cm distal to tuberosity)   Lymphadenopathy:    She has no cervical adenopathy.  Neurological: She is  alert.  Skin: Skin is warm and dry.  Psychiatric:  Not oriented to person, place or time    Data:  Venous duplex IMPRESSION: Body habitus degraded examination demonstrates apparent nonocclusive wall thickening/chronic DVT throughout the femoral vein. In the absence of prior examinations, an acute on chronic process is not excluded however overall appearance favors a chronic etiology. Clinical correlation is advised.     Assessment/Plan:     83 year old female with recent history of swelling of her left lower extremity found to have chronic DVT in her femoral vein proximally could not be evaluated given her body habitus.  She is currently maintained on Eliquis is not seem to be a huge fall risk.  She does not have any recent DVT per se but should probably be maintained on anticoagulation for approximately 3 months after which she should be on baby aspirin.  She may have a proximal obstruction or have reflux in her veins but I do not think procedures on this patient will be in her best interest.  I discussed with the family the medical treatment being anticoagulation followed by lifelong aspirin as well as the need for compression stockings and elevation of her leg when she is recumbent.  I provided him information on compression stockings and for this patient the best will be anything that she will legitimately wear.  Daughter demonstrates very good understanding and I can see her on an as-needed basis.     [name] MD [facility]

## 2017-11-15 ENCOUNTER — Emergency Department (HOSPITAL_COMMUNITY): Payer: Medicare Other

## 2017-11-15 ENCOUNTER — Emergency Department (HOSPITAL_COMMUNITY)
Admission: EM | Admit: 2017-11-15 | Discharge: 2017-11-15 | Disposition: A | Discharge status: Home / Self Care | Payer: Medicare Other | Attending: Emergency Medicine | Admitting: Emergency Medicine

## 2017-11-15 ENCOUNTER — Encounter (HOSPITAL_COMMUNITY): Payer: Self-pay | Admitting: Emergency Medicine

## 2017-11-15 DIAGNOSIS — F0390 Unspecified dementia without behavioral disturbance: Secondary | ICD-10-CM | POA: Insufficient documentation

## 2017-11-15 DIAGNOSIS — Z79899 Other long term (current) drug therapy: Secondary | ICD-10-CM | POA: Diagnosis not present

## 2017-11-15 DIAGNOSIS — Z7901 Long term (current) use of anticoagulants: Secondary | ICD-10-CM | POA: Insufficient documentation

## 2017-11-15 DIAGNOSIS — Z87891 Personal history of nicotine dependence: Secondary | ICD-10-CM | POA: Insufficient documentation

## 2017-11-15 DIAGNOSIS — R4182 Altered mental status, unspecified: Secondary | ICD-10-CM | POA: Diagnosis present

## 2017-11-15 DIAGNOSIS — I10 Essential (primary) hypertension: Secondary | ICD-10-CM | POA: Diagnosis not present

## 2017-11-15 LAB — I-STAT CHEM 8, ED
BUN: 19 mg/dL (ref 6–20)
Calcium, Ion: 1.27 mmol/L (ref 1.15–1.40)
Chloride: 105 mmol/L (ref 101–111)
Creatinine, Ser: 1.5 mg/dL — ABNORMAL HIGH (ref 0.44–1.00)
Glucose, Bld: 98 mg/dL (ref 65–99)
HCT: 36 % (ref 36.0–46.0)
Hemoglobin: 12.2 g/dL (ref 12.0–15.0)
Potassium: 4.8 mmol/L (ref 3.5–5.1)
Sodium: 143 mmol/L (ref 135–145)
TCO2: 24 mmol/L (ref 22–32)

## 2017-11-15 LAB — CBC
HCT: 33.6 % — ABNORMAL LOW (ref 36.0–46.0)
Hemoglobin: 10.5 g/dL — ABNORMAL LOW (ref 12.0–15.0)
MCH: 26.5 pg (ref 26.0–34.0)
MCHC: 31.3 g/dL (ref 30.0–36.0)
MCV: 84.8 fL (ref 78.0–100.0)
Platelets: 292 10*3/uL (ref 150–400)
RBC: 3.96 MIL/uL (ref 3.87–5.11)
RDW: 16.9 % — ABNORMAL HIGH (ref 11.5–15.5)
WBC: 6.8 10*3/uL (ref 4.0–10.5)

## 2017-11-15 LAB — CBG MONITORING, ED: Glucose-Capillary: 87 mg/dL (ref 65–99)

## 2017-11-15 LAB — COMPREHENSIVE METABOLIC PANEL
ALT: 16 U/L (ref 14–54)
AST: 25 U/L (ref 15–41)
Albumin: 3.2 g/dL — ABNORMAL LOW (ref 3.5–5.0)
Alkaline Phosphatase: 87 U/L (ref 38–126)
Anion gap: 7 (ref 5–15)
BUN: 17 mg/dL (ref 6–20)
CO2: 26 mmol/L (ref 22–32)
Calcium: 9.6 mg/dL (ref 8.9–10.3)
Chloride: 110 mmol/L (ref 101–111)
Creatinine, Ser: 1.61 mg/dL — ABNORMAL HIGH (ref 0.44–1.00)
GFR calc Af Amer: 34 mL/min — ABNORMAL LOW (ref >60)
GFR calc non Af Amer: 29 mL/min — ABNORMAL LOW (ref >60)
Glucose, Bld: 99 mg/dL (ref 65–99)
Potassium: 5.1 mmol/L (ref 3.5–5.1)
Sodium: 143 mmol/L (ref 135–145)
Total Bilirubin: 0.5 mg/dL (ref 0.3–1.2)
Total Protein: 7.6 g/dL (ref 6.5–8.1)

## 2017-11-15 LAB — DIFFERENTIAL
Basophils Absolute: 0 10*3/uL (ref 0.0–0.1)
Basophils Relative: 0 %
Eosinophils Absolute: 0.2 10*3/uL (ref 0.0–0.7)
Eosinophils Relative: 3 %
Lymphocytes Relative: 42 %
Lymphs Abs: 2.9 10*3/uL (ref 0.7–4.0)
Monocytes Absolute: 0.6 10*3/uL (ref 0.1–1.0)
Monocytes Relative: 8 %
Neutro Abs: 3.2 10*3/uL (ref 1.7–7.7)
Neutrophils Relative %: 47 %

## 2017-11-15 LAB — URINALYSIS, ROUTINE W REFLEX MICROSCOPIC
Bilirubin Urine: NEGATIVE
Glucose, UA: NEGATIVE mg/dL
Hgb urine dipstick: NEGATIVE
Ketones, ur: NEGATIVE mg/dL
Leukocytes, UA: NEGATIVE
Nitrite: NEGATIVE
Protein, ur: 30 mg/dL — AB
Specific Gravity, Urine: 1.011 (ref 1.005–1.030)
pH: 8 (ref 5.0–8.0)

## 2017-11-15 LAB — I-STAT TROPONIN, ED
Troponin i, poc: 0.12 ng/mL (ref 0.00–0.08)
Troponin i, poc: 0.13 ng/mL (ref 0.00–0.08)

## 2017-11-15 LAB — RAPID URINE DRUG SCREEN, HOSP PERFORMED
Amphetamines: NOT DETECTED
Barbiturates: NOT DETECTED
Benzodiazepines: NOT DETECTED
Cocaine: NOT DETECTED
Opiates: NOT DETECTED
Tetrahydrocannabinol: NOT DETECTED

## 2017-11-15 LAB — AMMONIA: Ammonia: 17 umol/L (ref 9–35)

## 2017-11-15 LAB — APTT: aPTT: 35 seconds (ref 24–36)

## 2017-11-15 LAB — PROTIME-INR
INR: 1.25
Prothrombin Time: 15.6 seconds — ABNORMAL HIGH (ref 11.4–15.2)

## 2017-11-15 LAB — ETHANOL: Alcohol, Ethyl (B): <10 mg/dL (ref <10)

## 2017-11-15 LAB — LACTIC ACID, PLASMA: Lactic Acid, Venous: 0.9 mmol/L (ref 0.5–1.9)

## 2017-11-15 NOTE — Discharge Instructions (Signed)
Her test results today were very reassuring.  There is no sign of stroke, heart attack, or serious infection.  Continue using her usual medications as well as the new sleeping pill that your doctor recently prescribed.  Return here, if needed, for problems.

## 2017-11-15 NOTE — ED Provider Notes (Signed)
[name] EMERGENCY DEPARTMENT Provider Note   CSN: [id number] Arrival date & time: 11/15/17  1447     History   Chief Complaint Chief Complaint  Patient presents with  . Altered Mental Status    HPI ROSENE [name] is a 83 y.o. female.  Patient arrives for evaluation of altered mental status, and decreased responsiveness.  Family members, daughters, arrived and gave history, at 20: 15 hours.  Daughter found the patient with decreased responsiveness upon awakening around 8 AM.  Later at 10 AM the patient was alert and conversant, back to her usual self.  About 2 hours later she was again altered.  At some point after that EMS was called and the patient was transferred here.  Recently, the patient has had decreased appetite for several weeks.  She saw her PCP, 4 days ago, and was felt to be possibly "low on B12," so was given an injection.  Also, recently, she saw a vascular surgeon for chronic left leg swelling, and was diagnosed with chronic DVT.  No interventions are planned for the DVT.  There is been no recent vomiting, fever, cough, focal weakness or paresthesia, according to family members.  Level 5 caveat-dementia.  HPI  Past Medical History:  Diagnosis Date  . Arthritis   . Dementia   . Hypertension     Patient Active Problem List   Diagnosis Date Noted  . ARF (acute renal failure) (HCC) 09/13/2017  . Dementia 09/13/2017  . Essential hypertension 09/13/2017  . Arthritis 09/13/2017  . Chronic back pain 09/13/2017  . Confusion 06/05/2012    Past Surgical History:  Procedure Laterality Date  . ABDOMINAL HYSTERECTOMY    . BACK SURGERY      OB History    No data available       Home Medications    Prior to Admission medications   Medication Sig Start Date End Date Taking? Authorizing Provider  acetaminophen-codeine (TYLENOL #3) 300-30 MG tablet Take 1-2 tablets by mouth every 6 (six) hours as needed for moderate pain.    Yes [provider]  atorvastatin (LIPITOR) 20 MG tablet Take 20 mg by mouth daily.   Yes [provider]  Coenzyme Q10 (COQ10) 100 MG CAPS Take 1 tablet by mouth daily.   Yes [provider]  ELIQUIS 2.5 MG TABS tablet Take 2.5 mg by mouth 2 (two) times daily. 10/11/17  Yes [provider]  feeding supplement, ENSURE ENLIVE, (ENSURE ENLIVE) LIQD Take 237 mLs by mouth 2 (two) times daily between meals. 09/15/17  Yes Noralee Stain, DO  Memantine HCl-Donepezil HCl (NAMZARIC) 28-10 MG CP24 TAKE 1 CAPSULE(S) EVERY DAY BY ORAL ROUTE. 09/23/16  Yes [provider]  NIFEdipine (PROCARDIA-XL/ADALAT CC) 30 MG 24 hr tablet Take 90 mg by mouth daily.   Yes [provider]    Family History No family history on file.  Social History Social History   Tobacco Use  . Smoking status: Former Smoker    Types: Cigarettes    Last attempt to quit: 09/25/1966    Years since quitting: 51.1  . Smokeless tobacco: Never Used  Substance Use Topics  . Alcohol use: No  . Drug use: No     Allergies   Patient has no known allergies.   Review of Systems Review of Systems  Unable to perform ROS: Dementia     Physical Exam Updated Vital Signs BP (!) 169/83   Pulse 75   Temp 99.1 F (37.3  C) (Rectal)   Resp 18   SpO2 100%   Physical Exam  Constitutional: She appears well-developed and well-nourished. She appears distressed (Alert and responsive, at 3:15 PM).  HENT:  Head: Normocephalic and atraumatic.  Eyes: Conjunctivae and EOM are normal. Pupils are equal, round, and reactive to light.  Neck: Normal range of motion and phonation normal. Neck supple.  Cardiovascular: Normal rate and regular rhythm.  Pulmonary/Chest: Effort normal and breath sounds normal. She exhibits no tenderness.  Abdominal: Soft. She exhibits no distension. There is no tenderness. There is no guarding.  Musculoskeletal: Normal range of motion.  Symmetric strength bilateral arms and legs   Neurological: She is alert. She exhibits normal muscle tone.  Oriented to person and place.  No dysarthria or aphasia.  Skin: Skin is warm and dry.  Psychiatric: Her behavior is normal.  She appears depressed  Nursing note and vitals reviewed.    ED Treatments / Results  Labs (all labs ordered are listed, but only abnormal results are displayed) Labs Reviewed  PROTIME-INR - Abnormal; Notable for the following components:      Result Value   Prothrombin Time 15.6 (*)    All other components within normal limits  CBC - Abnormal; Notable for the following components:   Hemoglobin 10.5 (*)    HCT 33.6 (*)    RDW 16.9 (*)    All other components within normal limits  COMPREHENSIVE METABOLIC PANEL - Abnormal; Notable for the following components:   Creatinine, Ser 1.61 (*)    Albumin 3.2 (*)    GFR calc non Af Amer 29 (*)    GFR calc Af Amer 34 (*)    All other components within normal limits  URINALYSIS, ROUTINE W REFLEX MICROSCOPIC - Abnormal; Notable for the following components:   Protein, ur 30 (*)    Bacteria, UA FEW (*)    Squamous Epithelial / LPF 0-5 (*)    All other components within normal limits  I-STAT CHEM 8, ED - Abnormal; Notable for the following components:   Creatinine, Ser 1.50 (*)    All other components within normal limits  I-STAT TROPONIN, ED - Abnormal; Notable for the following components:   Troponin i, poc 0.12 (*)    All other components within normal limits  I-STAT TROPONIN, ED - Abnormal; Notable for the following components:   Troponin i, poc 0.13 (*)    All other components within normal limits  URINE CULTURE  ETHANOL  APTT  DIFFERENTIAL  RAPID URINE DRUG SCREEN, HOSP PERFORMED  LACTIC ACID, PLASMA  AMMONIA  CBG MONITORING, ED  CBG MONITORING, ED    EKG  EKG Interpretation  Date/Time:  Thursday November 15 2017 15:02:16 EST Ventricular Rate:  58 PR Interval:    QRS Duration: 96 QT Interval:  471 QTC Calculation: 463 R  Axis:   9 Text Interpretation:  Sinus rhythm Baseline wander in lead(s) II since last tracing no significant change Confirmed by Mancel Bale [phone]) on 11/15/2017 5:43:37 PM       Radiology Ct Head Wo Contrast  Result Date: 11/15/2017 CLINICAL DATA:  Altered mental status and unresponsiveness. EXAM: CT HEAD WITHOUT CONTRAST TECHNIQUE: Contiguous axial images were obtained from the base of the skull through the vertex without intravenous contrast. COMPARISON:  02/09/2013 head CT FINDINGS: Brain: Moderate periventricular small vessel ischemic disease. No acute intracranial hemorrhage. Mild superficial and central atrophy. Intact midline fourth ventricle and basal cisterns. No intra-axial mass nor extra-axial fluid collections. No large vascular  territory infarct. Vascular: Atherosclerosis of the carotid siphons. No hyperdense vessels. Skull: No acute skull fracture or suspicious osseous lesions. Sinuses/Orbits: Intact orbits and globes. Clear bilateral mastoids and paranasal sinuses. Other: None. IMPRESSION: Chronic moderate small vessel ischemic disease of periventricular white matter, progressed since prior with superficial and central atrophy. No acute intracranial abnormality. Electronically Signed   By: Tollie Eth M.D.   On: 11/15/2017 16:51   Dg Chest Port 1 View  Result Date: 11/15/2017 CLINICAL DATA:  Altered mental status and unresponsiveness. Recent sore throat and abdominal pain. Recent DVT on Eliquis. EXAM: PORTABLE CHEST 1 VIEW COMPARISON:  06/05/2012 FINDINGS: Lungs are somewhat hypoinflated with mild elevation of the right hemidiaphragm. No definite focal airspace consolidation or effusion. Mild prominence of the right hilum unchanged. Mild cardiomegaly. Remainder of the exam is unchanged. IMPRESSION: No acute cardiopulmonary disease. Mild cardiomegaly. Electronically Signed   By: Elberta Fortis M.D.   On: 11/15/2017 15:51    Procedures Procedures (including critical care  time)  Medications Ordered in ED Medications - No data to display   Initial Impression / Assessment and Plan / ED Course  I have reviewed the triage vital signs and the nursing notes.  Pertinent labs & imaging results that were available during my care of the patient were reviewed by me and considered in my medical decision making (see chart for details).  Clinical Course as of Nov 16 1109  Thu Nov 15, 2017  1508 AMS noticed after awoke at home, following a nap starting at 10 AM today. Patient obtunded now, but responsive to simple commands, moving head and bilateral grip. Doubt acute stroke. Will evaluate comprehensively.  [EW]    Clinical Course User Index [EW] Mancel Bale, MD     Patient Vitals for the past 24 hrs:  BP Temp Temp src Pulse Resp SpO2  11/15/17 2030 (!) 169/83 - - 75 18 100 %  11/15/17 1945 (!) 160/71 - - 62 19 99 %  11/15/17 1830 (!) 173/80 - - - 14 -  11/15/17 1815 (!) 172/74 - - (!) 56 17 98 %  11/15/17 1800 (!) 174/75 - - (!) 55 16 99 %  11/15/17 1615 (!) 165/73 - - (!) 58 16 99 %  11/15/17 1600 (!) 172/75 - - 60 16 100 %  11/15/17 1545 (!) 165/80 - - (!) 58 13 97 %  11/15/17 1530 (!) 163/95 - - (!) 58 15 99 %  11/15/17 1505 (!) 185/75 99.1 F (37.3 C) Rectal 64 16 100 %  11/15/17 1447 - - - - - 100 %    8:33 PM Reevaluation with update and discussion. After initial assessment and treatment, an updated evaluation reveals patient is wide awake, alert, conversant and comfortable.  Family members are with her.  Findings discussed with family members and all questions answered. Mancel Bale     Final Clinical Impressions(s) / ED Diagnoses   Final diagnoses:  Altered mental status, unspecified altered mental status type    Transient altered mental status and confusion, likely related to dementia, and/or poor sleep pattern.  Screening evaluation was reassuring.  Urinalysis normal.  CBC normal except hemoglobin low 10.5.  Metabolic panel, liver function  tests and ammonia all normal.  Initial troponin elevated, unchanged after 3 hours.  Doubt ACS.  EKG is reassuring.  Doubt ischemia or infarct.  Patient treated with IV fluids and had good recovery of baseline mental status.  Repeat vital signs were reassuring.  Doubt serious bacterial infection, metabolic  instability or impending vascular collapse.  Nursing Notes Reviewed/ Care Coordinated Applicable Imaging Reviewed Interpretation of Laboratory Data incorporated into ED treatment  The patient appears reasonably screened and/or stabilized for discharge and I doubt any other medical condition or other [facility] requiring further screening, evaluati2on, or treatment in the ED at this time prior to discharge.  Plan: Home Medications-continue usual medications; Home Treatments-rest, fluids; return here if the recommended treatment, does not improve the symptoms; Recommended follow up-PCP as needed  ED Discharge Orders    None       Mancel BaleWentz, Deb Loudin, MD 11/16/17 1114

## 2017-11-15 NOTE — ED Triage Notes (Signed)
Pt arrives via EMS from home with complaints of AMS and unresponsiveness. Per family, pt was LSN at 10 am and complaining of sore throat and abd pain. Pt went to sleep and family was unable to arouse. Pt was recently started on eliquis for a DVT. Hx of dementia and baseline is confused.

## 2017-11-15 NOTE — ED Notes (Signed)
I stat troponin results given to Dr. Effie ShyWentz by B. Bing PlumeHaynes, EMT

## 2017-11-15 NOTE — ED Notes (Signed)
ED Provider at bedside. 

## 2017-11-16 LAB — URINE CULTURE: Culture: NO GROWTH

## 2017-11-23 ENCOUNTER — Emergency Department (HOSPITAL_COMMUNITY): Payer: Medicare Other

## 2017-11-23 ENCOUNTER — Observation Stay (HOSPITAL_COMMUNITY)
Admission: EM | Admit: 2017-11-23 | Discharge: 2017-11-27 | Disposition: A | Discharge status: Home / Self Care | Payer: Medicare Other | Attending: Internal Medicine | Admitting: Internal Medicine

## 2017-11-23 ENCOUNTER — Other Ambulatory Visit: Payer: Self-pay

## 2017-11-23 ENCOUNTER — Encounter (HOSPITAL_COMMUNITY): Payer: Self-pay

## 2017-11-23 DIAGNOSIS — I081 Rheumatic disorders of both mitral and tricuspid valves: Secondary | ICD-10-CM | POA: Insufficient documentation

## 2017-11-23 DIAGNOSIS — F0150 Vascular dementia without behavioral disturbance: Secondary | ICD-10-CM

## 2017-11-23 DIAGNOSIS — I160 Hypertensive urgency: Secondary | ICD-10-CM | POA: Diagnosis not present

## 2017-11-23 DIAGNOSIS — N189 Chronic kidney disease, unspecified: Secondary | ICD-10-CM

## 2017-11-23 DIAGNOSIS — Z79899 Other long term (current) drug therapy: Secondary | ICD-10-CM | POA: Insufficient documentation

## 2017-11-23 DIAGNOSIS — E785 Hyperlipidemia, unspecified: Secondary | ICD-10-CM | POA: Insufficient documentation

## 2017-11-23 DIAGNOSIS — R7989 Other specified abnormal findings of blood chemistry: Secondary | ICD-10-CM | POA: Diagnosis not present

## 2017-11-23 DIAGNOSIS — Z87891 Personal history of nicotine dependence: Secondary | ICD-10-CM | POA: Insufficient documentation

## 2017-11-23 DIAGNOSIS — I82519 Chronic embolism and thrombosis of unspecified femoral vein: Secondary | ICD-10-CM | POA: Diagnosis not present

## 2017-11-23 DIAGNOSIS — R0782 Intercostal pain: Secondary | ICD-10-CM | POA: Diagnosis not present

## 2017-11-23 DIAGNOSIS — R0789 Other chest pain: Principal | ICD-10-CM | POA: Insufficient documentation

## 2017-11-23 DIAGNOSIS — R001 Bradycardia, unspecified: Secondary | ICD-10-CM | POA: Insufficient documentation

## 2017-11-23 DIAGNOSIS — R079 Chest pain, unspecified: Secondary | ICD-10-CM | POA: Diagnosis present

## 2017-11-23 DIAGNOSIS — R748 Abnormal levels of other serum enzymes: Secondary | ICD-10-CM | POA: Insufficient documentation

## 2017-11-23 DIAGNOSIS — R778 Other specified abnormalities of plasma proteins: Secondary | ICD-10-CM

## 2017-11-23 DIAGNOSIS — I700 Atherosclerosis of aorta: Secondary | ICD-10-CM | POA: Diagnosis not present

## 2017-11-23 DIAGNOSIS — I429 Cardiomyopathy, unspecified: Secondary | ICD-10-CM | POA: Diagnosis not present

## 2017-11-23 DIAGNOSIS — E876 Hypokalemia: Secondary | ICD-10-CM | POA: Diagnosis not present

## 2017-11-23 DIAGNOSIS — N179 Acute kidney failure, unspecified: Secondary | ICD-10-CM | POA: Diagnosis not present

## 2017-11-23 DIAGNOSIS — Z7901 Long term (current) use of anticoagulants: Secondary | ICD-10-CM | POA: Insufficient documentation

## 2017-11-23 DIAGNOSIS — I129 Hypertensive chronic kidney disease with stage 1 through stage 4 chronic kidney disease, or unspecified chronic kidney disease: Secondary | ICD-10-CM | POA: Insufficient documentation

## 2017-11-23 DIAGNOSIS — N183 Chronic kidney disease, stage 3 (moderate): Secondary | ICD-10-CM | POA: Insufficient documentation

## 2017-11-23 DIAGNOSIS — M1990 Unspecified osteoarthritis, unspecified site: Secondary | ICD-10-CM | POA: Diagnosis not present

## 2017-11-23 DIAGNOSIS — Z86718 Personal history of other venous thrombosis and embolism: Secondary | ICD-10-CM | POA: Insufficient documentation

## 2017-11-23 DIAGNOSIS — N289 Disorder of kidney and ureter, unspecified: Secondary | ICD-10-CM

## 2017-11-23 DIAGNOSIS — F0390 Unspecified dementia without behavioral disturbance: Secondary | ICD-10-CM | POA: Diagnosis present

## 2017-11-23 DIAGNOSIS — G8929 Other chronic pain: Secondary | ICD-10-CM | POA: Diagnosis not present

## 2017-11-23 DIAGNOSIS — M549 Dorsalgia, unspecified: Secondary | ICD-10-CM | POA: Diagnosis not present

## 2017-11-23 LAB — COMPREHENSIVE METABOLIC PANEL
ALT: 17 U/L (ref 14–54)
AST: 24 U/L (ref 15–41)
Albumin: 2.7 g/dL — ABNORMAL LOW (ref 3.5–5.0)
Alkaline Phosphatase: 81 U/L (ref 38–126)
Anion gap: 9 (ref 5–15)
BUN: 11 mg/dL (ref 6–20)
CO2: 21 mmol/L — ABNORMAL LOW (ref 22–32)
Calcium: 8.9 mg/dL (ref 8.9–10.3)
Chloride: 107 mmol/L (ref 101–111)
Creatinine, Ser: 1.15 mg/dL — ABNORMAL HIGH (ref 0.44–1.00)
GFR calc Af Amer: 51 mL/min — ABNORMAL LOW (ref >60)
GFR calc non Af Amer: 44 mL/min — ABNORMAL LOW (ref >60)
Glucose, Bld: 108 mg/dL — ABNORMAL HIGH (ref 65–99)
Potassium: 3.9 mmol/L (ref 3.5–5.1)
Sodium: 137 mmol/L (ref 135–145)
Total Bilirubin: 0.6 mg/dL (ref 0.3–1.2)
Total Protein: 7.6 g/dL (ref 6.5–8.1)

## 2017-11-23 LAB — CBG MONITORING, ED: Glucose-Capillary: 107 mg/dL — ABNORMAL HIGH (ref 65–99)

## 2017-11-23 LAB — I-STAT TROPONIN, ED
Troponin i, poc: 0.18 ng/mL (ref 0.00–0.08)
Troponin i, poc: 0.15 ng/mL (ref 0.00–0.08)

## 2017-11-23 LAB — TROPONIN I
Troponin I: 0.41 ng/mL (ref <0.03)
Troponin I: 0.41 ng/mL (ref <0.03)

## 2017-11-23 LAB — CBC
HCT: 30.5 % — ABNORMAL LOW (ref 36.0–46.0)
Hemoglobin: 9.5 g/dL — ABNORMAL LOW (ref 12.0–15.0)
MCH: 25.8 pg — ABNORMAL LOW (ref 26.0–34.0)
MCHC: 31.1 g/dL (ref 30.0–36.0)
MCV: 82.9 fL (ref 78.0–100.0)
Platelets: 386 10*3/uL (ref 150–400)
RBC: 3.68 MIL/uL — ABNORMAL LOW (ref 3.87–5.11)
RDW: 17.2 % — ABNORMAL HIGH (ref 11.5–15.5)
WBC: 5.9 10*3/uL (ref 4.0–10.5)

## 2017-11-23 MED ORDER — ACETAMINOPHEN-CODEINE #3 300-30 MG PO TABS
1.0000 | ORAL_TABLET | Freq: Four times a day (QID) | ORAL | Status: DC | PRN
  Administered 2017-11-25 – 2017-11-26 (×2): 2 via ORAL
  Administered 2017-11-27: 1 via ORAL
  Filled 2017-11-23: qty 2
  Filled 2017-11-23: qty 1
  Filled 2017-11-23: qty 2

## 2017-11-23 MED ORDER — NIFEDIPINE ER OSMOTIC RELEASE 90 MG PO TB24
90.0000 mg | ORAL_TABLET | Freq: Every day | ORAL | Status: DC
  Administered 2017-11-23 – 2017-11-24 (×2): 90 mg via ORAL
  Filled 2017-11-23 (×2): qty 1

## 2017-11-23 MED ORDER — NITROGLYCERIN 2 % TD OINT
1.0000 [in_us] | TOPICAL_OINTMENT | Freq: Four times a day (QID) | TRANSDERMAL | Status: DC
  Administered 2017-11-23 – 2017-11-27 (×16): 1 [in_us] via TOPICAL
  Filled 2017-11-23: qty 30

## 2017-11-23 MED ORDER — ASPIRIN 81 MG PO CHEW
324.0000 mg | CHEWABLE_TABLET | Freq: Once | ORAL | Status: AC
  Administered 2017-11-23: 162 mg via ORAL
  Filled 2017-11-23: qty 4

## 2017-11-23 MED ORDER — ATORVASTATIN CALCIUM 20 MG PO TABS
20.0000 mg | ORAL_TABLET | Freq: Every day | ORAL | Status: DC
  Administered 2017-11-23 – 2017-11-26 (×4): 20 mg via ORAL
  Filled 2017-11-23 (×4): qty 1

## 2017-11-23 MED ORDER — NITROGLYCERIN 2 % TD OINT
1.0000 [in_us] | TOPICAL_OINTMENT | Freq: Once | TRANSDERMAL | Status: AC
  Administered 2017-11-23: 1 [in_us] via TOPICAL
  Filled 2017-11-23: qty 1

## 2017-11-23 MED ORDER — ASPIRIN 325 MG PO TABS
325.0000 mg | ORAL_TABLET | Freq: Once | ORAL | Status: DC
  Filled 2017-11-23: qty 1

## 2017-11-23 MED ORDER — ACETAMINOPHEN 325 MG PO TABS: 650.0000 mg | ORAL_TABLET | ORAL | Status: DC | PRN

## 2017-11-23 MED ORDER — ONDANSETRON HCL 4 MG/2ML IJ SOLN: 4.0000 mg | Freq: Four times a day (QID) | INTRAMUSCULAR | Status: DC | PRN

## 2017-11-23 MED ORDER — APIXABAN 2.5 MG PO TABS
2.5000 mg | ORAL_TABLET | Freq: Two times a day (BID) | ORAL | Status: DC
  Administered 2017-11-23 – 2017-11-27 (×8): 2.5 mg via ORAL
  Filled 2017-11-23 (×8): qty 1

## 2017-11-23 MED ORDER — CLONIDINE HCL 0.1 MG PO TABS
0.1000 mg | ORAL_TABLET | Freq: Two times a day (BID) | ORAL | Status: DC
  Administered 2017-11-23 – 2017-11-25 (×5): 0.1 mg via ORAL
  Filled 2017-11-23 (×5): qty 1

## 2017-11-23 MED ORDER — MIRTAZAPINE 15 MG PO TABS
7.5000 mg | ORAL_TABLET | Freq: Every day | ORAL | Status: DC
  Administered 2017-11-23 – 2017-11-26 (×4): 7.5 mg via ORAL
  Filled 2017-11-23 (×4): qty 1

## 2017-11-23 NOTE — H&P (Addendum)
TRH H&P    Patient Demographics:    [name] [name], is a 83 y.o. female  MRN: [id number]  DOB - 31-Dec-1934  Admit Date - 11/23/2017  Referring MD/NP/PA: Jodi Geralds  Outpatient Primary MD for the patient is Andi Devon, MD  Patient coming from: Home  Chief complaint- *chest pain   HPI:    [name] [name]  is a 83 y.o. female, with history of dementia, hypertension, chronic back pain was brought to hospital after patient complained of chest pain this morning. Patient is a poor historian so history obtained from daughter at bedside. As per daughter this morning patient complains of chest pain which was persistent so she called 911. There was no history of shortness of breath. No nausea vomiting or diarrhea. Patient does not have previous history of CAD. Denies fever or chills. No abdominal pain. No dysuria, urgency or frequency of urination.  In the ED patient was seen by cardiology. Recommended observation for second troponin and echo  in a.m.    Review of systems:      All other systems reviewed and are negative.   With Past History of the following :    Past Medical History:  Diagnosis Date  . Arthritis   . Dementia   . Hypertension       Past Surgical History:  Procedure Laterality Date  . ABDOMINAL HYSTERECTOMY    . BACK SURGERY        Social History:      Social History   Tobacco Use  . Smoking status: Former Smoker    Types: Cigarettes    Last attempt to quit: 09/25/1966    Years since quitting: 51.1  . Smokeless tobacco: Never Used  Substance Use Topics  . Alcohol use: No       Family History :   Patient's brother died of lung cancer   Home Medications:   Prior to Admission medications   Medication Sig Start Date End Date Taking? Authorizing Provider  acetaminophen-codeine (TYLENOL #3) 300-30 MG tablet Take 1-2 tablets by mouth every 6 (six) hours as needed for  moderate pain.    Yes [provider]  atorvastatin (LIPITOR) 20 MG tablet Take 20 mg by mouth daily.   Yes [provider]  cloNIDine (CATAPRES) 0.1 MG tablet Take 0.1 mg by mouth 2 (two) times daily. 10/28/17  Yes [provider]  Coenzyme Q10 (COQ10) 100 MG CAPS Take 1 tablet by mouth daily.   Yes [provider]  ELIQUIS 2.5 MG TABS tablet Take 2.5 mg by mouth 2 (two) times daily. 10/11/17  Yes [provider]  feeding supplement, ENSURE ENLIVE, (ENSURE ENLIVE) LIQD Take 237 mLs by mouth 2 (two) times daily between meals. 09/15/17  Yes Noralee Stain, DO  Memantine HCl-Donepezil HCl (NAMZARIC) 28-10 MG CP24 TAKE 1 CAPSULE(S) EVERY DAY BY ORAL ROUTE. 09/23/16  Yes [provider]  mirtazapine (REMERON) 7.5 MG tablet Take 7.5 mg by mouth at bedtime. 11/13/17  Yes [provider]  NIFEdipine (PROCARDIA-XL/ADALAT CC) 30 MG 24 hr tablet  Take 90 mg by mouth daily.   Yes [provider]  traMADol (ULTRAM) 50 MG tablet Take 50 mg by mouth every 6 (six) hours as needed for pain. 10/28/17  Yes [provider]     Allergies:    No Known Allergies   Physical Exam:   Vitals  Blood pressure (!) 162/79, pulse 69, temperature 97.6 F (36.4 C), temperature source Axillary, resp. rate 16, height 4\' 11"  (1.499 m), weight 70.8 kg (156 lb), SpO2 100 %.  1.  General: Appears in no acute distress  2. Psychiatric:  Intact judgement and  insight, awake alert, oriented x 3.  3. Neurologic: No focal neurological deficits, all cranial nerves intact.Strength 5/5 all 4 extremities, sensation intact all 4 extremities, plantars down going.  4. Eyes :  anicteric sclerae, moist conjunctivae with no lid lag. PERRLA.  5. ENMT:  Oropharynx clear with moist mucous membranes and good dentition  6. Neck:  supple, no cervical lymphadenopathy appriciated, No thyromegaly  7. Respiratory : Normal respiratory effort, good air movement  bilaterally,clear to  auscultation bilaterally  8. Cardiovascular : RRR, no gallops, rubs or murmurs, no leg edema  9. Gastrointestinal:  Positive bowel sounds, abdomen soft, non-tender to palpation,no hepatosplenomegaly, no rigidity or guarding       10. Skin:  No cyanosis, normal texture and turgor, no rash, lesions or ulcers  11.Musculoskeletal:  Good muscle tone,  joints appear normal , no effusions,  normal range of motion    Data Review:    CBC Recent Labs  Lab 11/23/17 1122  WBC 5.9  HGB 9.5*  HCT 30.5*  PLT 386  MCV 82.9  MCH 25.8*  MCHC 31.1  RDW 17.2*   ------------------------------------------------------------------------------------------------------------------  Chemistries  Recent Labs  Lab 11/23/17 1122  NA 137  K 3.9  CL 107  CO2 21*  GLUCOSE 108*  BUN 11  CREATININE 1.15*  CALCIUM 8.9  AST 24  ALT 17  ALKPHOS 81  BILITOT 0.6   ------------------------------------------------------------------------------------------------------------------  ------------------------------------------------------------------------------------------------------------------ GFR: Estimated Creatinine Clearance: 33.4 mL/min (A) (by C-G formula based on SCr of 1.15 mg/dL (H)). Liver Function Tests: Recent Labs  Lab 11/23/17 1122  AST 24  ALT 17  ALKPHOS 81  BILITOT 0.6  PROT 7.6  ALBUMIN 2.7*   CBG: Recent Labs  Lab 11/23/17 1119  GLUCAP 107*    --------------------------------------------------------------------------------------------------------------- Urine analysis:    Component Value Date/Time   COLORURINE YELLOW 11/15/2017 1510   APPEARANCEUR CLEAR 11/15/2017 1510   LABSPEC 1.011 11/15/2017 1510   PHURINE 8.0 11/15/2017 1510   GLUCOSEU NEGATIVE 11/15/2017 1510   HGBUR NEGATIVE 11/15/2017 1510   BILIRUBINUR NEGATIVE 11/15/2017 1510   KETONESUR NEGATIVE 11/15/2017 1510   PROTEINUR 30 (A) 11/15/2017 1510   UROBILINOGEN 0.2 02/09/2013  1849   NITRITE NEGATIVE 11/15/2017 1510   LEUKOCYTESUR NEGATIVE 11/15/2017 1510      Imaging Results:    Dg Chest 2 View  Result Date: 11/23/2017 CLINICAL DATA:  Dementia.  Chest pain. EXAM: CHEST  2 VIEW COMPARISON:  11/15/2017 FINDINGS: Aortic atherosclerosis and mild cardiac enlargement. No pleural effusion or edema identified. No airspace opacities identified. Degenerative disc disease noted within the thoracic spine. IMPRESSION: 1. No acute cardiopulmonary abnormalities. 2.  Aortic Atherosclerosis (ICD10-I70.0). Electronically Signed   By: Signa Kell M.D.   On: 11/23/2017 12:05    My personal review of EKG: Rhythm NSR   Assessment & Plan:    Principal Problem:   Chest pain Active Problems:   Dementia  Elevated troponin   DVT, femoral, chronic (HCC)   Acute on chronic renal insufficiency   1. Chest pain- patient had mild elevation of troponin 0.15, admit  under observation, cycle troponin every 6 hours 3. Echocardiogram in a.m. Cardiology is following. Continue Nitropaste 1 inch every 6 hours. 2. History of femoral DVT-continue eliquis 3. Hypertension-blood pressure stable, continue nifedipine, clonidine. 4. Hyperlipidemia- continue Lipitor 5. Dementia-stable, no bladder disturbance. Continue Remeron 6. Chronic kidney disease stage III- creatinine is stable at 1.5.    DVT Prophylaxis-   eliquis   AM Labs Ordered, also please review Full Orders  Family Communication: Admission, patients condition and plan of care including tests being ordered have been discussed with the patient and her family at bedside* who indicate understanding and agree with the plan and Code Status.  Code Status: Full code  Admission status: Observation    Time spent in minutes : 60 minutes   Meredeth IdeGagan S Boni Maclellan M.D on 11/23/2017 at 3:49 PM  Between 7am to 7pm - Pager - [phone]. After 7pm go to [email or web address] - password [email or web address]  [facility] - Office  [phone]

## 2017-11-23 NOTE — ED Triage Notes (Signed)
Pt with hx of dementia via EMS from home for altered mental status changes. Pt seen here for similar 11/15/17. Per EMS, pt family reports pt was at baseline yesterday however today she was difficult to arouse. EMS VS: 160/81, 67 HR, 96% on RA, CBG 146, 18 G LAC. Pt family also mentions that pt was c/o CP.

## 2017-11-23 NOTE — ED Provider Notes (Signed)
[facility] EMERGENCY DEPARTMENT Provider Note   CSN: [id number] Arrival date & time: 11/23/17  1107     History   Chief Complaint Chief Complaint  Patient presents with  . Chest Pain    HPI  [name] [name] is a 83 y.o. Female with a history of dementia, hypertension, renal insufficiency and arthritis, who presents to the ED for evaluation of chest pain.  Patient is able to provide limited history in the setting of her dementia, history primarily obtained from family members present at the bedside who report the patient began complaining of chest pain at about 10:35 this morning.  Patient's daughter reports she walked into the room and found patient holding her chest, they deny any difficulty breathing.  Family reports chest pain persisted at home so they called 911.  Patient currently endorsing chest pain, and when asked about it grimaces.  Patient denies shortness of breath or abdominal pain.  Family reports no episodes of emesis or diarrhea recently.  No fevers or chills noted.  Initially some confusion regarding altered mental status, but family reports patient is at her baseline.  Family reports no prior history of cardiac events, patient does not currently have a cardiologist, on review of chart it looks like at one point echo and myocardial perfusion scan had been ordered but were never done, no history of heart cath previously.  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 Lemar Livings with vascular surgery, for which she is on 2.5 of Eliquis daily, no surgical interventions planned at this time.      Past Medical History:  Diagnosis Date  . Arthritis   . Dementia   . Hypertension     Patient Active Problem List   Diagnosis Date Noted  . ARF (acute renal failure) (HCC) 09/13/2017  . Dementia 09/13/2017  . Essential hypertension 09/13/2017  . Arthritis 09/13/2017  . Chronic back pain 09/13/2017  . Confusion  06/05/2012    Past Surgical History:  Procedure Laterality Date  . ABDOMINAL HYSTERECTOMY    . BACK SURGERY      OB History    No data available       Home Medications    Prior to Admission medications   Medication Sig Start Date End Date Taking? Authorizing Provider  acetaminophen-codeine (TYLENOL #3) 300-30 MG tablet Take 1-2 tablets by mouth every 6 (six) hours as needed for moderate pain.     [provider]  atorvastatin (LIPITOR) 20 MG tablet Take 20 mg by mouth daily.    [provider]  Coenzyme Q10 (COQ10) 100 MG CAPS Take 1 tablet by mouth daily.    [provider]  ELIQUIS 2.5 MG TABS tablet Take 2.5 mg by mouth 2 (two) times daily. 10/11/17   [provider]  feeding supplement, ENSURE ENLIVE, (ENSURE ENLIVE) LIQD Take 237 mLs by mouth 2 (two) times daily between meals. 09/15/17   Noralee Stain, DO  Memantine HCl-Donepezil HCl (NAMZARIC) 28-10 MG CP24 TAKE 1 CAPSULE(S) EVERY DAY BY ORAL ROUTE. 09/23/16   [provider]  NIFEdipine (PROCARDIA-XL/ADALAT CC) 30 MG 24 hr tablet Take 90 mg by mouth daily.    [provider]    Family History No family history on file.  Social History Social History   Tobacco Use  . Smoking status: Former Smoker    Types: Cigarettes    Last attempt to quit: 09/25/1966    Years since quitting: 51.1  .  Smokeless tobacco: Never Used  Substance Use Topics  . Alcohol use: No  . Drug use: No     Allergies   Patient has no known allergies.   Review of Systems Review of Systems  Unable to perform ROS: Dementia     Physical Exam Updated Vital Signs BP (!) 169/87 (BP Location: Right Arm)   Pulse 98   Temp 97.6 F (36.4 C) (Axillary)   Resp 18   Ht 4\' 11"  (1.499 m)   Wt 70.8 kg (156 lb)   SpO2 98%   BMI 31.51 kg/m   Physical Exam  Constitutional: She appears well-developed and well-nourished. No distress.  Pt is chronically ill appearing, but in no acute distress    HENT:  Head: Normocephalic and atraumatic.  Eyes: EOM are normal. Pupils are equal, round, and reactive to light. Right eye exhibits no discharge. Left eye exhibits no discharge.  Neck: Neck supple.  Cardiovascular: Normal rate, regular rhythm, normal heart sounds and intact distal pulses.  Pulmonary/Chest: Effort normal and breath sounds normal. No stridor. No respiratory distress. She has no wheezes. She has no rales.  Respirations equal and unlabored, patient able to speak in full sentences, lungs clear to auscultation bilaterally, chest pain somewhat reproducible on palpation of the chest, patient continues to complain of active chest pain  Abdominal: Soft. Bowel sounds are normal. She exhibits no distension and no mass. There is no tenderness. There is no guarding.  Musculoskeletal: She exhibits no deformity.  Neurological: She is alert. Coordination normal.  Patient able to follow simple commands, minimally verbal but will answer yes or no questions, moving all extremities purposefully  Skin: Skin is warm and dry. Capillary refill takes less than 2 seconds. She is not diaphoretic.  Psychiatric: She has a normal mood and affect. Her behavior is normal.  Nursing note and vitals reviewed.    ED Treatments / Results  Labs (all labs ordered are listed, but only abnormal results are displayed) Labs Reviewed  COMPREHENSIVE METABOLIC PANEL - Abnormal; Notable for the following components:      Result Value   CO2 21 (*)    Glucose, Bld 108 (*)    Creatinine, Ser 1.15 (*)    Albumin 2.7 (*)    GFR calc non Af Amer 44 (*)    GFR calc Af Amer 51 (*)    All other components within normal limits  CBC - Abnormal; Notable for the following components:   RBC 3.68 (*)    Hemoglobin 9.5 (*)    HCT 30.5 (*)    MCH 25.8 (*)    RDW 17.2 (*)    All other components within normal limits  TROPONIN I - Abnormal; Notable for the following components:   Troponin I 0.41 (*)    All other components  within normal limits  CBG MONITORING, ED - Abnormal; Notable for the following components:   Glucose-Capillary 107 (*)    All other components within normal limits  I-STAT TROPONIN, ED - Abnormal; Notable for the following components:   Troponin i, poc 0.18 (*)    All other components within normal limits  I-STAT TROPONIN, ED - Abnormal; Notable for the following components:   Troponin i, poc 0.15 (*)    All other components within normal limits  TROPONIN I  TROPONIN I    EKG  EKG Interpretation  Date/Time:  Friday November 23 2017 12:37:35 EST Ventricular Rate:  71 PR Interval:    QRS Duration: 119 QT Interval:  446 QTC Calculation: 485 R Axis:   30 Text Interpretation:  Sinus rhythm Nonspecific intraventricular conduction delay Artifact in lead(s) I II III aVR aVL Confirmed by Bethann BerkshireZammit, Joseph [phone]([id number]) on 11/23/2017 3:23:12 PM       Radiology Dg Chest 2 View  Result Date: 11/23/2017 CLINICAL DATA:  Dementia.  Chest pain. EXAM: CHEST  2 VIEW COMPARISON:  11/15/2017 FINDINGS: Aortic atherosclerosis and mild cardiac enlargement. No pleural effusion or edema identified. No airspace opacities identified. Degenerative disc disease noted within the thoracic spine. IMPRESSION: 1. No acute cardiopulmonary abnormalities. 2.  Aortic Atherosclerosis (ICD10-I70.0). Electronically Signed   By: Signa Kellaylor  Stroud M.D.   On: 11/23/2017 12:05    Procedures Procedures (including critical care time)  Medications Ordered in ED Medications  nitroGLYCERIN (NITROGLYN) 2 % ointment 1 inch (1 inch Topical Given 11/23/17 1222)  aspirin chewable tablet 324 mg (162 mg Oral Given 11/23/17 1223)     Initial Impression / Assessment and Plan / ED Course  I have reviewed the triage vital signs and the nursing notes.  Pertinent labs & imaging results that were available during my care of the patient were reviewed by me and considered in my medical decision making (see chart for details).  Patient presents to the ED  for evaluation of chest pain radiating to the neck and left arm that started this morning, history is difficult to obtain due to patient's dementia, majority of the information obtained from patient's family members.  Patient continues to endorse active chest pain during initial evaluation, vitals normal and patient appears to be in no acute distress.  Lungs clear, pulses intact in all extremities.  Labs, EKG and chest x-ray ordered.  11:58 AM notified by lab of positive troponin of 0.18, patient still having active chest pain at this time.  Consult placed to cardiology.  324 of chewable aspirin and Nitropaste ordered. EKG without acute changes, CXR  Shows no acute cardiopulmonary disease  Labs otherwise reassuring, hemoglobin is just 1 point below baseline, this is likely variation as patient not complaining of any active bleeding symptoms, no leukocytosis, no acute electrolyte derangements requiring intervention creatinine is 1.15 which is actually reduced from patient's typical baseline of 1.71.  Chest x-ray without active. cardiopulmonary disease.  12:09 PM spoke with Trish with cardiology, she will page Dr. Rennis GoldenHilty for immediate recommendations, cardiology to see the patient as well.  3:17 PM cardiology PA and Dr. Isabel CapriceVaranassi seen and evaluated the patient, now that the patient is pain-free, they recommend medicine admission for chest pain rule out cycling of enzymes, plan to order echocardiogram.  Will consult for unassigned medical admission.  3:32 PM Spoke with Dr. Sharl MaLama who will see and admit the patient  Dr. Estell HarpinZammit saw and evaluated the patient as well and is in agreement with plan.   Final Clinical Impressions(s) / ED Diagnoses   Final diagnoses:  Chest pain, unspecified type    ED Discharge Orders    None       Dartha LodgeFord, Malynda Smolinski N, [place] JerseyPA-C 11/23/17 Leanor [id number]    Zammit, Joseph, MD 11/26/17 [phone]

## 2017-11-23 NOTE — Consult Note (Addendum)
Cardiology Consultation:   Patient ID: [name] [name]; [id number]; [birth year]   Admit date: 11/23/2017 Date of Consult: 11/23/2017  Primary Care Provider: Andi Devon, MD Primary Cardiologist: New  Patient Profile:   [name] [name] is a 83 y.o. female with a hx of recent dx of chronic femoral DVT (11/07/17, now on low-dose Eliquis), dementia with transient confusion, hypertension and acute renal failure who is being seen today for the evaluation of chest pain at the request of Dr. Estell Harpin.   History of Present Illness:   [name] [name] is an 83 year old female with no prior CAD history, however was recenly diagnosis of chronic lower extremity DVT, currently on low-dose Eliquis and recent hospitalization for altered mental status likely secondary to a progression of her dementia. She lives at home with her two daughters who help with her daily care, including medication administration, ADL/IADL's and and other daily concerns.  Our interview was moderately difficult to follow given her dementia, however her daughters are at bedside who helped with interpretation. Apparently, upon waking this morning on 11/23/17, the patient had complaints of right anterior chest discomfort, described as a pressure-like in nature, rated as an 8/10 in severity at its worst. She states the pain began while lying in the bed, with no exertional features. It is most severe in her right chest, however, is additionally present in her left arm with associated mild SOB. The anterior chest discomfort is reproducible, but again difficult to distinguish. She reports that she has never experienced this in the past. She denies dizziness, palpitations, nausea, vomiting, back or abdominal pain, or syncope. She reports compliance with her medications. Given her chest pain symptoms, her daughters brought her to the emergency department for further evaluation.    On 11/23/2017, she presented to the emergency department where an EKG was  performed which shows normal sinus rhythm with a heart rate of 71 with no acute ST-T wave abnormalities, however there is artifact present which makes it difficult to determine. In comparing her last EKG readings from 11/15/2017, there appears to be no significant changes. A troponin level was drawn which was positive at 0.18.  However, her troponin levels have been elevated in the past, most recently on 11/15/2017 at 0.12-0.13. This was thought to be elevated given her acute kidney injury. Creatinine levels were as high as 2.65, however appear to be baseline at 1.5-1.6. Her creatinine today is 1.15.  A chest x-ray was performed which shows no acute cardiopulmonary abnormalities. She is not tachycardiac nor tachypnic. She states that her pain is now approximately a 1-2/10. She appears comfortable on exam. NTG paste in place.   Cardiology was consulted for further input.   Will have internal medicine to admit for observation. Spoke with ED P.A. regarding recommendations.   Past Medical History:  Diagnosis Date  . Arthritis   . Dementia   . Hypertension     Past Surgical History:  Procedure Laterality Date  . ABDOMINAL HYSTERECTOMY    . BACK SURGERY       Prior to Admission medications   Medication Sig Start Date End Date Taking? Authorizing Provider  acetaminophen-codeine (TYLENOL #3) 300-30 MG tablet Take 1-2 tablets by mouth every 6 (six) hours as needed for moderate pain.    Yes [provider]  atorvastatin (LIPITOR) 20 MG tablet Take 20 mg by mouth daily.   Yes [provider]  cloNIDine (CATAPRES) 0.1 MG tablet Take 0.1 mg by mouth 2 (two) times daily. 10/28/17  Yes [provider]  Coenzyme Q10 (COQ10) 100 MG CAPS Take 1 tablet by mouth daily.   Yes [provider]  ELIQUIS 2.5 MG TABS tablet Take 2.5 mg by mouth 2 (two) times daily. 10/11/17  Yes [provider]  feeding supplement, ENSURE ENLIVE, (ENSURE ENLIVE) LIQD Take 237 mLs by mouth 2  (two) times daily between meals. 09/15/17  Yes Noralee Stain, DO  Memantine HCl-Donepezil HCl (NAMZARIC) 28-10 MG CP24 TAKE 1 CAPSULE(S) EVERY DAY BY ORAL ROUTE. 09/23/16  Yes [provider]  mirtazapine (REMERON) 7.5 MG tablet Take 7.5 mg by mouth at bedtime. 11/13/17  Yes [provider]  NIFEdipine (PROCARDIA-XL/ADALAT CC) 30 MG 24 hr tablet Take 90 mg by mouth daily.   Yes [provider]  traMADol (ULTRAM) 50 MG tablet Take 50 mg by mouth every 6 (six) hours as needed for pain. 10/28/17  Yes [provider]    Inpatient Medications: Scheduled Meds:  Continuous Infusions:  PRN Meds:   Allergies:   No Known Allergies  Social History:   Social History   Socioeconomic History  . Marital status: Widowed    Spouse name: Not on file  . Number of children: Not on file  . Years of education: Not on file  . Highest education level: Not on file  Social Needs  . Financial resource strain: Not on file  . Food insecurity - worry: Not on file  . Food insecurity - inability: Not on file  . Transportation needs - medical: Not on file  . Transportation needs - non-medical: Not on file  Occupational History  . Not on file  Tobacco Use  . Smoking status: Former Smoker    Types: Cigarettes    Last attempt to quit: 09/25/1966    Years since quitting: 51.1  . Smokeless tobacco: Never Used  Substance and Sexual Activity  . Alcohol use: No  . Drug use: No  . Sexual activity: No  Other Topics Concern  . Not on file  Social History Narrative  . Not on file    Family History:   No family history on file. Family Status:  No family status information on file.    ROS:  Please see the history of present illness.  All other ROS reviewed and negative.     Physical Exam/Data:   Vitals:   11/23/17 1212 11/23/17 1300 11/23/17 1330 11/23/17 1400  BP: (!) 155/68 (!) 158/71 (!) 153/91 (!) 142/74  Pulse: 68 64 74 65  Resp: 17 12 16 19   Temp:        TempSrc:      SpO2: 100% 98% 95% 100%  Weight:      Height:       No intake or output data in the 24 hours ending 11/23/17 1431 Filed Weights   11/23/17 1120  Weight: 156 lb (70.8 kg)   Body mass index is 31.51 kg/m.   General: Elderly, well developed, well nourished, NAD Skin: Warm, dry, intact  Head: Normocephalic, atraumatic, clear, moist mucus membranes. Neck: Negative for carotid bruits. No JVD Lungs:Clear to ausculation bilaterally. No wheezes, rales, or rhonchi. Breathing is unlabored. Cardiovascular: RRR with S1 S2. No murmurs, rubs, or gallops Abdomen: Soft, non-tender, non-distended with normoactive bowel sounds. No obvious abdominal masses. MSK: Strength and tone appear normal for age. 5/5 in all extremities Extremities: No edema. No clubbing or cyanosis. DP/PT pulses 2+ bilaterally Neuro: Alert and oriented. No focal deficits. No facial asymmetry. MAE spontaneously. Psych: Responds  to questions appropriately with normal affect.     EKG:  The EKG was personally reviewed and demonstrates:  11/23/17 NSR HR 71 with artifact Telemetry:  Telemetry was personally reviewed and demonstrates:  11/23/17 NSR 67  Relevant CV Studies:  ECHO: NA  CATH: NA  Laboratory Data:  Chemistry Recent Labs  Lab 11/23/17 1122  NA 137  K 3.9  CL 107  CO2 21*  GLUCOSE 108*  BUN 11  CREATININE 1.15*  CALCIUM 8.9  GFRNONAA 44*  GFRAA 51*  ANIONGAP 9    Total Protein  Date Value Ref Range Status  11/23/2017 7.6 6.5 - 8.1 g/dL Final   Albumin  Date Value Ref Range Status  11/23/2017 2.7 (L) 3.5 - 5.0 g/dL Final   AST  Date Value Ref Range Status  11/23/2017 24 15 - 41 U/L Final   ALT  Date Value Ref Range Status  11/23/2017 17 14 - 54 U/L Final   Alkaline Phosphatase  Date Value Ref Range Status  11/23/2017 81 38 - 126 U/L Final   Total Bilirubin  Date Value Ref Range Status  11/23/2017 0.6 0.3 - 1.2 mg/dL Final   Hematology Recent Labs  Lab 11/23/17 1122   WBC 5.9  RBC 3.68*  HGB 9.5*  HCT 30.5*  MCV 82.9  MCH 25.8*  MCHC 31.1  RDW 17.2*  PLT 386   Cardiac EnzymesNo results for input(s): TROPONINI in the last 168 hours.  Recent Labs  Lab 11/23/17 1139  TROPIPOC 0.18*    BNPNo results for input(s): BNP, PROBNP in the last 168 hours.  DDimer No results for input(s): DDIMER in the last 168 hours. TSH: No results found for: TSH Lipids:No results found for: CHOL, HDL, LDLCALC, LDLDIRECT, TRIG, CHOLHDL HgbA1c:No results found for: HGBA1C  Radiology/Studies:  Dg Chest 2 View  Result Date: 11/23/2017 CLINICAL DATA:  Dementia.  Chest pain. EXAM: CHEST  2 VIEW COMPARISON:  11/15/2017 FINDINGS: Aortic atherosclerosis and mild cardiac enlargement. No pleural effusion or edema identified. No airspace opacities identified. Degenerative disc disease noted within the thoracic spine. IMPRESSION: 1. No acute cardiopulmonary abnormalities. 2.  Aortic Atherosclerosis (ICD10-I70.0). Electronically Signed   By: Signa Kell M.D.   On: 11/23/2017 12:05    Assessment and Plan:   1. Chest pain with elevated troponin: -Trop 0.12, continue trending -Trop levels have been elevated in the past, including last ED admission 11/15/17 with a trend of 0.12>0.13, thought to be elevated secondary to renal dysfunction and the absence on anginal symptoms -EKG without acute ST-T wave changes, however diffuse artifact -Continues to have mild (1-2/10) chest discomfort -324mg  ASA given this admission  -NTG past in place -On home nifedipine 30mg  -Will order echocardiogram   2. Recent dx chronic femoral DVT: -Recent dx of chronic femoral DVT, on Eliquis 2.5mg   -ASA lifelong per MD note>> unclear if she has been taking this,>>>she was given 324mg  ASA this admission  -Followed by Dr. Randie Heinz with further recommendations to utilize compression stockings and LE elevation per office note  3. Acute on chronic renal insufficiency: -Cr, 1.61>1.50>1.15 today  -Baseline  appears to be in the 1.5 range  4. Dementia: -On memantine-Donepezil 28-10mg    5. HLD: -Continue statin  6. HTN: -Stable, 153/78>142/74>153/91, pt has not had her medications today -Clonidine 0.1mg  PO BID -On home nifedipine 30mg    For questions or updates, please contact [facility] Please consult [email or web address] for contact info under Cardiology/STEMI.   SignedGeorgie Chard NP-C [facility] Pager: [phone] 11/23/2017 2:31  PM   I have examined the patient and reviewed assessment and plan and discussed with patient.  Agree with above as stated.  Currently pain free.  Atypical chest pain.  Plan for rule out MI.  OK to check echo.  No RWMA will be reassuring.  WOuld not pursue ischemia eval given her dementia and overall comorbidities.  Treat pain symptoms.  I suspect they are more musculoskeletal in nature.    Lance MussJayadeep Varanasi

## 2017-11-23 NOTE — ED Notes (Addendum)
Dr Estell HarpinZammit notified of pt condition. Abby EMT at the bedside getting EKG

## 2017-11-23 NOTE — ED Notes (Signed)
Per pt family, cardiology has been in to see pt. Awaiting dispo at this time

## 2017-11-23 NOTE — ED Notes (Signed)
Admitting at the bedside.  

## 2017-11-23 NOTE — ED Notes (Addendum)
Pt placed on cardiac monitor. EKG given to Dr Estell HarpinZammit

## 2017-11-23 NOTE — Progress Notes (Signed)
[name] [name] admitted from ED to room 3E22.  Daughters at bedside.  Patient oriented to room and unit routines.  Patient denies chest pain.  Lab called with abnormal Troponin of 0.41.  Dr. Sharl MaLama informed via text page.

## 2017-11-23 NOTE — ED Notes (Addendum)
Pt able to follow commands and open eyes and mouth. Pt given 2 aspirin tablets. This RN ensured pt chewed a swallowed tablets fully. Pt able to verbalize that she was able to swallow however pt then reports she does not want to take other 2 aspirin tablets due to it taking more effort to chew them. EDP notified

## 2017-11-23 NOTE — Plan of Care (Signed)
  Completed/Met Education: Knowledge of General Education information will improve 11/23/2017 1758 - Completed/Met by Alonna Buckler, RN Coping: Level of anxiety will decrease 11/23/2017 1758 - Completed/Met by Alonna Buckler, RN

## 2017-11-23 NOTE — ED Notes (Signed)
Pt able to follow commands and roll when asked. Pt changed into gown and Purewick placed

## 2017-11-23 NOTE — ED Notes (Signed)
Pt in XR at this time

## 2017-11-23 NOTE — ED Notes (Addendum)
Upon speaking with pt daughter, pt's daughter and other family reports no change in mental status and that pt is at her baseline. Pt's daughter states pt c/o CP at approx 10:35 AM this morning. Pt's daughter walked into pt's room and found pt holding her chest c/o pain. Denies any AMS

## 2017-11-24 ENCOUNTER — Observation Stay (HOSPITAL_BASED_OUTPATIENT_CLINIC_OR_DEPARTMENT_OTHER): Payer: Medicare Other

## 2017-11-24 DIAGNOSIS — R079 Chest pain, unspecified: Secondary | ICD-10-CM | POA: Diagnosis not present

## 2017-11-24 DIAGNOSIS — I214 Non-ST elevation (NSTEMI) myocardial infarction: Secondary | ICD-10-CM

## 2017-11-24 DIAGNOSIS — F0150 Vascular dementia without behavioral disturbance: Secondary | ICD-10-CM | POA: Diagnosis not present

## 2017-11-24 DIAGNOSIS — I82519 Chronic embolism and thrombosis of unspecified femoral vein: Secondary | ICD-10-CM | POA: Diagnosis not present

## 2017-11-24 DIAGNOSIS — N289 Disorder of kidney and ureter, unspecified: Secondary | ICD-10-CM | POA: Diagnosis not present

## 2017-11-24 DIAGNOSIS — R0782 Intercostal pain: Secondary | ICD-10-CM | POA: Diagnosis not present

## 2017-11-24 DIAGNOSIS — N189 Chronic kidney disease, unspecified: Secondary | ICD-10-CM

## 2017-11-24 DIAGNOSIS — R072 Precordial pain: Secondary | ICD-10-CM | POA: Diagnosis not present

## 2017-11-24 LAB — ECHOCARDIOGRAM COMPLETE
Area-P 1/2: 2.42 cm2
E decel time: 310 msec
E/e' ratio: 9.26
FS: 24 % — AB (ref 28–44)
Height: 59 in
IVS/LV PW RATIO, ED: 1.08
LA ID, A-P, ES: 29 mm
LA diam end sys: 29 mm
LA diam index: 1.69 cm/m2
LA vol A4C: 33.8 ml
LA vol index: 22.5 mL/m2
LA vol: 38.5 mL
LV E/e' medial: 9.26
LV E/e'average: 9.26
LV PW d: 13 mm — AB (ref 0.6–1.1)
LV dias vol index: 42 mL/m2
LV dias vol: 72 mL (ref 46–106)
LV e' LATERAL: 3.65 cm/s
LV sys vol index: 21 mL/m2
LV sys vol: 36 mL
LVOT SV: 53 mL
LVOT VTI: 15.2 cm
LVOT area: 3.46 cm2
LVOT diameter: 21 mm
LVOT peak grad rest: 3 mmHg
LVOT peak vel: 88.9 cm/s
Lateral S' vel: 12 cm/s
MV Dec: 310
MV pk A vel: 89.4 m/s
MV pk E vel: 33.8 m/s
P 1/2 time: 91 ms
RV sys press: 25 mmHg
Reg peak vel: 233 cm/s
Simpson's disk: 50
Stroke v: 36 ml
TAPSE: 20.4 mm
TDI e' lateral: 3.65
TDI e' medial: 2.68
TR max vel: 233 cm/s
Weight: 2408 oz

## 2017-11-24 LAB — TROPONIN I: Troponin I: 0.41 ng/mL (ref <0.03)

## 2017-11-24 MED ORDER — AMLODIPINE BESYLATE 5 MG PO TABS
5.0000 mg | ORAL_TABLET | Freq: Every day | ORAL | Status: DC
  Administered 2017-11-24 – 2017-11-25 (×2): 5 mg via ORAL
  Filled 2017-11-24 (×2): qty 1

## 2017-11-24 MED ORDER — MAGNESIUM HYDROXIDE 400 MG/5ML PO SUSP
30.0000 mL | Freq: Every day | ORAL | Status: DC | PRN
  Administered 2017-11-24: 30 mL via ORAL
  Filled 2017-11-24: qty 30

## 2017-11-24 MED ORDER — LISINOPRIL 5 MG PO TABS
2.5000 mg | ORAL_TABLET | Freq: Every day | ORAL | Status: DC
  Administered 2017-11-24: 2.5 mg via ORAL
  Filled 2017-11-24 (×2): qty 1

## 2017-11-24 MED ORDER — CARVEDILOL 6.25 MG PO TABS
6.2500 mg | ORAL_TABLET | Freq: Two times a day (BID) | ORAL | Status: DC
  Administered 2017-11-24 – 2017-11-25 (×3): 6.25 mg via ORAL
  Filled 2017-11-24 (×4): qty 1

## 2017-11-24 NOTE — Progress Notes (Signed)
Progress Note  Patient Name: [name] GammonSusie W Fargnoli Date of Encounter: 11/24/2017  Primary Cardiologist: No primary care provider on file.   Subjective   A very pleasant patient, daughter is at bedside, she denies any CP or SOB.   Inpatient Medications    Scheduled Meds: . apixaban  2.5 mg Oral BID  . atorvastatin  20 mg Oral QHS  . cloNIDine  0.1 mg Oral BID  . mirtazapine  7.5 mg Oral QHS  . NIFEdipine  90 mg Oral Daily  . nitroGLYCERIN  1 inch Topical Q6H   Continuous Infusions:  PRN Meds: acetaminophen, acetaminophen-codeine, ondansetron (ZOFRAN) IV   Vital Signs    Vitals:   11/23/17 1652 11/23/17 2100 11/24/17 0026 11/24/17 0349  BP: (!) 171/91 (!) 170/79 (!) 156/74 (!) 177/85  Pulse: 78 88 84 77  Resp: 18 18 18 18   Temp: (!) 97.4 F (36.3 C) 99.1 F (37.3 C) 98.8 F (37.1 C) 98.9 F (37.2 C)  TempSrc: Oral Oral Oral Oral  SpO2: 100% 100% 100% 98%  Weight: 150 lb 2.1 oz (68.1 kg)   150 lb 8 oz (68.3 kg)  Height:        Intake/Output Summary (Last 24 hours) at 11/24/2017 1031 Last data filed at 11/24/2017 0944 Gross per 24 hour  Intake 300 ml  Output 550 ml  Net -250 ml   Filed Weights   11/23/17 1120 11/23/17 1652 11/24/17 0349  Weight: 156 lb (70.8 kg) 150 lb 2.1 oz (68.1 kg) 150 lb 8 oz (68.3 kg)    Telemetry    SR - Personally Reviewed  ECG    SR, nonspecific ST T wave abnormalities - Personally Reviewed  Physical Exam   GEN: No acute distress.   Neck: No JVD Cardiac: RRR, no murmurs, rubs, or gallops.  Respiratory: Clear to auscultation bilaterally. GI: Soft, nontender, non-distended  MS: No edema; No deformity. Neuro:  Nonfocal  Psych: Normal affect   Labs    Chemistry Recent Labs  Lab 11/23/17 1122  NA 137  K 3.9  CL 107  CO2 21*  GLUCOSE 108*  BUN 11  CREATININE 1.15*  CALCIUM 8.9  PROT 7.6  ALBUMIN 2.7*  AST 24  ALT 17  ALKPHOS 81  BILITOT 0.6  GFRNONAA 44*  GFRAA 51*  ANIONGAP 9     Hematology Recent Labs  Lab  11/23/17 1122  WBC 5.9  RBC 3.68*  HGB 9.5*  HCT 30.5*  MCV 82.9  MCH 25.8*  MCHC 31.1  RDW 17.2*  PLT 386    Cardiac Enzymes Recent Labs  Lab 11/23/17 1523 11/23/17 2126 11/24/17 0330  TROPONINI 0.41* 0.41* 0.41*    Recent Labs  Lab 11/23/17 1139 11/23/17 1449  TROPIPOC 0.18* 0.15*     BNPNo results for input(s): BNP, PROBNP in the last 168 hours.   DDimer No results for input(s): DDIMER in the last 168 hours.   Radiology    Dg Chest 2 View  Result Date: 11/23/2017 CLINICAL DATA:  Dementia.  Chest pain. EXAM: CHEST  2 VIEW COMPARISON:  11/15/2017 FINDINGS: Aortic atherosclerosis and mild cardiac enlargement. No pleural effusion or edema identified. No airspace opacities identified. Degenerative disc disease noted within the thoracic spine. IMPRESSION: 1. No acute cardiopulmonary abnormalities. 2.  Aortic Atherosclerosis (ICD10-I70.0). Electronically Signed   By: Signa Kellaylor  Stroud M.D.   On: 11/23/2017 12:05   Cardiac Studies   Echo is pending  Patient Profile     83 y.o. female with no  prior CAD, hx of and recent dx of chronic femoral DVT (11/07/17, now on low-dose Eliquis), dementia with transient confusion, hypertension and acute renal failure who is being seen today for the evaluation of chest pain at the request of Dr. Estell Harpin.   Assessment & Plan    1. Chest pain with elevated troponin: -Trop 0.12 --> 0.41 -Trop levels have been elevated in the past, including last ED admission 11/15/17 with a trend of 0.12>0.13, thought to be elevated secondary to renal dysfunction and the absence on anginal symptoms -EKG without acute ST-T wave changes, however diffuse artifact -She is now chest pain free -324mg  ASA given this admission  -NTG past in place -Echocardiogram is pending - given advanced dementia (she sometimes doesn't recognize her family members), limited mobility and the fact that this is her first episode of chest pain I would focus on BP control, if her LVEF  is significantly impaired (normal in 2010) and she has recurrent CP we might consider a cath.  2. Recent dx chronic femoral DVT: -Recent dx of chronic femoral DVT, on Eliquis 2.5mg   -ASA lifelong per MD note>> unclear if she has been taking this,>>>she was given 324mg  ASA this admission  -Followed by Dr. Randie Heinz with further recommendations to utilize compression stockings and LE elevation per office note  3. Acute on chronic renal insufficiency: -Cr, 1.61>1.50>1.15 today  -Baseline appears to be in the 1.5 range  4. Dementia: -On memantine-Donepezil 28-10mg    5. HLD: -Continue statin  6. HTN: -Stable, 153/78>142/74>153/91, pt has not had her medications today -Clonidine 0.1mg  PO BID -On home nifedipine 30mg   For questions or updates, please contact [facility] Please consult [email or web address] for contact info under Cardiology/STEMI.      Signed, Tobias Alexander, MD  11/24/2017, 10:31 AM

## 2017-11-24 NOTE — Progress Notes (Signed)
*  PRELIMINARY RESULTS* Echocardiogram 2D Echocardiogram has been performed.  Stacey DrainWhite, Traeson Dusza J 11/24/2017, 1:35 PM

## 2017-11-24 NOTE — Progress Notes (Signed)
Pt. IV hurting, and requesting to be removed.  IV removed.  Dr. York SpanielBuriev aware that no IV access currently.  Patient and family aware that if IV medications are needed, an IV would need to be replaced.

## 2017-11-24 NOTE — Progress Notes (Signed)
Patients troponin for 2126 and 0330 are both elevated at 0.41. MD has been Notified. Will continue to monitor.   Elsie Lincolnaven Adaline Trejos, RN

## 2017-11-24 NOTE — Progress Notes (Signed)
[name] [name] PROGRESS NOTE  [name] [name]:[id number] DOB: 09/23/1935 DOA: 11/23/2017 PCP: Andi Devon, MD  Brief summary    83 y.o. female, with history of dementia, hypertension, dvt on eliques, chronic back pain was brought to hospital after patient complained of chest pain this morning. Patient is a poor historian so history obtained from daughter at bedside. As per daughter this morning patient complains of chest pain which was persistent so she called 911. There was no history of shortness of breath. No nausea vomiting or diarrhea. Patient does not have previous history of CAD. Denies fever or chills. No abdominal pain. No dysuria, urgency or frequency of urination.  In the ED patient was seen by cardiology. Recommended observation for second troponin and echo     Assessment/Plan:  Chest pain. Atypical presentation.  patient had mild elevation of troponin picked at 0.41 will monitor, pend Echocardiogram. Appreciate Cardiology eval.cont bb, statin, aspirin, ntg  History of femoral DVT-continue eliquis  Hypertension. Uncontrolled. But improving, clonidine, coreg, lisinopril added by cards.   Hyperlipidemia- continue Lipitor  Dementia-stable, no bladder disturbance. Continue Remeron  Chronic kidney disease stage III- creatinine is stable at 1.5.     Code Status: full Family Communication: d/w patient, her family (indicate person spoken with, relationship, and if by phone, the number) Disposition Plan: pend further work up    Consultants:  Cardiology   Procedures:  Echo   Antibiotics:  none (indicate start date, and stop date if known)  HPI/Subjective: Alert. No distress. No acute chest pains   Objective: Vitals:   11/24/17 0349 11/24/17 1212  BP: (!) 177/85 (!) 157/76  Pulse: 77 73  Resp: 18 20  Temp: 98.9 F (37.2 C) 98.6 F (37 C)  SpO2: 98% 99%    Intake/Output Summary (Last 24 hours) at 11/24/2017 1237 Last data filed at 11/24/2017  0944 Gross per 24 hour  Intake 300 ml  Output 550 ml  Net -250 ml   Filed Weights   11/23/17 1120 11/23/17 1652 11/24/17 0349  Weight: 70.8 kg (156 lb) 68.1 kg (150 lb 2.1 oz) 68.3 kg (150 lb 8 oz)    Exam:   General: alert. No distress   Cardiovascular: s1,s2 rrr  Respiratory: CTA BL  Abdomen: soft, nt, dn   Musculoskeletal: no leg edema    Data Reviewed: Basic Metabolic Panel: Recent Labs  Lab 11/23/17 1122  NA 137  K 3.9  CL 107  CO2 21*  GLUCOSE 108*  BUN 11  CREATININE 1.15*  CALCIUM 8.9   Liver Function Tests: Recent Labs  Lab 11/23/17 1122  AST 24  ALT 17  ALKPHOS 81  BILITOT 0.6  PROT 7.6  ALBUMIN 2.7*   No results for input(s): LIPASE, AMYLASE in the last 168 hours. No results for input(s): AMMONIA in the last 168 hours. CBC: Recent Labs  Lab 11/23/17 1122  WBC 5.9  HGB 9.5*  HCT 30.5*  MCV 82.9  PLT 386   Cardiac Enzymes: Recent Labs  Lab 11/23/17 1523 11/23/17 2126 11/24/17 0330  TROPONINI 0.41* 0.41* 0.41*   BNP (last 3 results) No results for input(s): BNP in the last 8760 hours.  ProBNP (last 3 results) No results for input(s): PROBNP in the last 8760 hours.  CBG: Recent Labs  Lab 11/23/17 1119  GLUCAP 107*    Recent Results (from the past 240 hour(s))  Urine culture     Status: None   Collection Time: 11/15/17  5:58 PM  Result Value Ref Range  Status   Specimen Description URINE, CLEAN CATCH  Final   Special Requests NONE  Final   Culture   Final    NO GROWTH Performed at [facility], [address] [place], [place] [address]    Report Status 11/16/2017 FINAL  Final     Studies: Dg Chest 2 View  Result Date: 11/23/2017 CLINICAL DATA:  Dementia.  Chest pain. EXAM: CHEST  2 VIEW COMPARISON:  11/15/2017 FINDINGS: Aortic atherosclerosis and mild cardiac enlargement. No pleural effusion or edema identified. No airspace opacities identified. Degenerative disc disease noted within the thoracic spine.  IMPRESSION: 1. No acute cardiopulmonary abnormalities. 2.  Aortic Atherosclerosis (ICD10-I70.0). Electronically Signed   By: Signa Kellaylor  Stroud M.D.   On: 11/23/2017 12:05    Scheduled Meds: . amLODipine  5 mg Oral Daily  . apixaban  2.5 mg Oral BID  . atorvastatin  20 mg Oral QHS  . carvedilol  6.25 mg Oral BID WC  . cloNIDine  0.1 mg Oral BID  . lisinopril  2.5 mg Oral Daily  . mirtazapine  7.5 mg Oral QHS  . nitroGLYCERIN  1 inch Topical Q6H   Continuous Infusions:  Principal Problem:   Chest pain Active Problems:   Dementia   Elevated troponin   DVT, femoral, chronic (HCC)   Acute on chronic renal insufficiency    Time spent: >35 minutes     [name] [name] N  [name] [name] Pager [phone]. If 7PM-7AM, please contact night-coverage at [email or web address], password [email or web address] 11/24/2017, 12:37 PM  LOS: 0 days

## 2017-11-25 DIAGNOSIS — N289 Disorder of kidney and ureter, unspecified: Secondary | ICD-10-CM | POA: Diagnosis not present

## 2017-11-25 DIAGNOSIS — R0782 Intercostal pain: Secondary | ICD-10-CM | POA: Diagnosis not present

## 2017-11-25 DIAGNOSIS — F0150 Vascular dementia without behavioral disturbance: Secondary | ICD-10-CM | POA: Diagnosis not present

## 2017-11-25 DIAGNOSIS — I160 Hypertensive urgency: Secondary | ICD-10-CM

## 2017-11-25 DIAGNOSIS — I82519 Chronic embolism and thrombosis of unspecified femoral vein: Secondary | ICD-10-CM | POA: Diagnosis not present

## 2017-11-25 DIAGNOSIS — R748 Abnormal levels of other serum enzymes: Secondary | ICD-10-CM | POA: Diagnosis not present

## 2017-11-25 MED ORDER — LISINOPRIL 5 MG PO TABS
5.0000 mg | ORAL_TABLET | Freq: Every day | ORAL | Status: DC
  Administered 2017-11-25 – 2017-11-27 (×3): 5 mg via ORAL
  Filled 2017-11-25 (×2): qty 1

## 2017-11-25 NOTE — Progress Notes (Signed)
[facility] PROGRESS NOTE  [name] [name] ZHY:[id number] DOB: 11-02-1934 DOA: 11/23/2017 PCP: Andi Devon, MD  Brief summary    83 y.o. female, with history of dementia, hypertension, dvt on eliques, chronic back pain was brought to hospital after patient complained of chest pain this morning. Patient is a poor historian so history obtained from daughter at bedside. As per daughter this morning patient complains of chest pain which was persistent so she called 911. There was no history of shortness of breath. No nausea vomiting or diarrhea. Patient does not have previous history of CAD. Denies fever or chills. No abdominal pain. No dysuria, urgency or frequency of urination.  In the ED patient was seen by cardiology. Recommended observation for second troponin and echo     Assessment/Plan:  Chest pain. Atypical presentation. No recurrence.  patient had mild elevation of troponin picked at 0.41. Echocardiogram: The estimated ejection fraction was in the   range of 30% to 35%. Diffuse hypokinesis. Awaiting further cardiology recommendations. Appreciate Cardiology eval.cont bb, statin, aspirin, ntg  History of femoral DVT-continue eliquis  Hypertension. Uncontrolled. But improving, clonidine, coreg, lisinopril added by cards.  Hyperlipidemia- continue Lipitor Dementia-stable, no bladder disturbance. Continue Remeron Chronic kidney disease stage III- creatinine is stable at 1.5.     Code Status: full Family Communication: d/w patient, her family (indicate person spoken with, relationship, and if by phone, the number) Disposition Plan: pend further work up    Consultants:  Cardiology   Procedures:  Echo   Antibiotics:  none (indicate start date, and stop date if known)  HPI/Subjective: Alert. No distress. Could not sleep well last night  Objective: Vitals:   11/24/17 2118 11/25/17 0338  BP: 129/71 139/68  Pulse: 65 64  Resp: 20 20  Temp: 99.1 F (37.3 C)  98.3 F (36.8 C)  SpO2: 100% 99%    Intake/Output Summary (Last 24 hours) at 11/25/2017 0938 Last data filed at 11/24/2017 2303 Gross per 24 hour  Intake 420 ml  Output 350 ml  Net 70 ml   Filed Weights   11/23/17 1652 11/24/17 0349 11/25/17 0338  Weight: 68.1 kg (150 lb 2.1 oz) 68.3 kg (150 lb 8 oz) 68.2 kg (150 lb 5.7 oz)    Exam:   General: alert. No distress   Cardiovascular: s1,s2 rrr  Respiratory: CTA BL  Abdomen: soft, nt, dn   Musculoskeletal: no leg edema    Data Reviewed: Basic Metabolic Panel: Recent Labs  Lab 11/23/17 1122  NA 137  K 3.9  CL 107  CO2 21*  GLUCOSE 108*  BUN 11  CREATININE 1.15*  CALCIUM 8.9   Liver Function Tests: Recent Labs  Lab 11/23/17 1122  AST 24  ALT 17  ALKPHOS 81  BILITOT 0.6  PROT 7.6  ALBUMIN 2.7*   No results for input(s): LIPASE, AMYLASE in the last 168 hours. No results for input(s): AMMONIA in the last 168 hours. CBC: Recent Labs  Lab 11/23/17 1122  WBC 5.9  HGB 9.5*  HCT 30.5*  MCV 82.9  PLT 386   Cardiac Enzymes: Recent Labs  Lab 11/23/17 1523 11/23/17 2126 11/24/17 0330  TROPONINI 0.41* 0.41* 0.41*   BNP (last 3 results) No results for input(s): BNP in the last 8760 hours.  ProBNP (last 3 results) No results for input(s): PROBNP in the last 8760 hours.  CBG: Recent Labs  Lab 11/23/17 1119  GLUCAP 107*    Recent Results (from the past 240 hour(s))  Urine culture  Status: None   Collection Time: 11/15/17  5:58 PM  Result Value Ref Range Status   Specimen Description URINE, CLEAN CATCH  Final   Special Requests NONE  Final   Culture   Final    NO GROWTH Performed at [facility], [address] [place], [place] [address]    Report Status 11/16/2017 FINAL  Final     Studies: Dg Chest 2 View  Result Date: 11/23/2017 CLINICAL DATA:  Dementia.  Chest pain. EXAM: CHEST  2 VIEW COMPARISON:  11/15/2017 FINDINGS: Aortic atherosclerosis and mild cardiac enlargement. No  pleural effusion or edema identified. No airspace opacities identified. Degenerative disc disease noted within the thoracic spine. IMPRESSION: 1. No acute cardiopulmonary abnormalities. 2.  Aortic Atherosclerosis (ICD10-I70.0). Electronically Signed   By: Signa Kellaylor  Stroud M.D.   On: 11/23/2017 12:05    Scheduled Meds: . amLODipine  5 mg Oral Daily  . apixaban  2.5 mg Oral BID  . atorvastatin  20 mg Oral QHS  . carvedilol  6.25 mg Oral BID WC  . cloNIDine  0.1 mg Oral BID  . lisinopril  2.5 mg Oral Daily  . mirtazapine  7.5 mg Oral QHS  . nitroGLYCERIN  1 inch Topical Q6H   Continuous Infusions:  Principal Problem:   Chest pain Active Problems:   Dementia   Elevated troponin   DVT, femoral, chronic (HCC)   Acute on chronic renal insufficiency   Hypertensive urgency    Time spent: >35 minutes     Esperanza SheetsBURIEV, [name] Urbieta N  [facility] Pager [phone]. If 7PM-7AM, please contact night-coverage at [email or web address], password [email or web address] 11/25/2017, 9:38 AM  LOS: 0 days

## 2017-11-25 NOTE — Progress Notes (Signed)
Progress Note  Patient Name: [name] GammonSusie W Crossen Date of Encounter: 11/25/2017  Primary Cardiologist: No primary care provider on file.   Subjective   The patient is sleeping, per son who is at bedside she wasn't sleeping the last night what is normal for her.   Inpatient Medications    Scheduled Meds: . amLODipine  5 mg Oral Daily  . apixaban  2.5 mg Oral BID  . atorvastatin  20 mg Oral QHS  . carvedilol  6.25 mg Oral BID WC  . cloNIDine  0.1 mg Oral BID  . lisinopril  2.5 mg Oral Daily  . mirtazapine  7.5 mg Oral QHS  . nitroGLYCERIN  1 inch Topical Q6H   Continuous Infusions:  PRN Meds: acetaminophen, acetaminophen-codeine, magnesium hydroxide, ondansetron (ZOFRAN) IV   Vital Signs    Vitals:   11/24/17 0349 11/24/17 1212 11/24/17 2118 11/25/17 0338  BP: (!) 177/85 (!) 157/76 129/71 139/68  Pulse: 77 73 65 64  Resp: 18 20 20 20   Temp: 98.9 F (37.2 C) 98.6 F (37 C) 99.1 F (37.3 C) 98.3 F (36.8 C)  TempSrc: Oral Oral Oral Oral  SpO2: 98% 99% 100% 99%  Weight: 150 lb 8 oz (68.3 kg)   150 lb 5.7 oz (68.2 kg)  Height:        Intake/Output Summary (Last 24 hours) at 11/25/2017 0914 Last data filed at 11/24/2017 2303 Gross per 24 hour  Intake 420 ml  Output 350 ml  Net 70 ml   Filed Weights   11/23/17 1652 11/24/17 0349 11/25/17 0338  Weight: 150 lb 2.1 oz (68.1 kg) 150 lb 8 oz (68.3 kg) 150 lb 5.7 oz (68.2 kg)    Telemetry    SR - Personally Reviewed  ECG    SR, nonspecific ST T wave abnormalities - Personally Reviewed  Physical Exam   GEN: No acute distress.   Neck: No JVD Cardiac: RRR, no murmurs, rubs, or gallops.  Respiratory: Clear to auscultation bilaterally. GI: Soft, nontender, non-distended  MS: No edema; No deformity. Neuro:  Nonfocal  Psych: Normal affect   Labs    Chemistry Recent Labs  Lab 11/23/17 1122  NA 137  K 3.9  CL 107  CO2 21*  GLUCOSE 108*  BUN 11  CREATININE 1.15*  CALCIUM 8.9  PROT 7.6  ALBUMIN 2.7*  AST 24   ALT 17  ALKPHOS 81  BILITOT 0.6  GFRNONAA 44*  GFRAA 51*  ANIONGAP 9     Hematology Recent Labs  Lab 11/23/17 1122  WBC 5.9  RBC 3.68*  HGB 9.5*  HCT 30.5*  MCV 82.9  MCH 25.8*  MCHC 31.1  RDW 17.2*  PLT 386    Cardiac Enzymes Recent Labs  Lab 11/23/17 1523 11/23/17 2126 11/24/17 0330  TROPONINI 0.41* 0.41* 0.41*    Recent Labs  Lab 11/23/17 1139 11/23/17 1449  TROPIPOC 0.18* 0.15*     BNPNo results for input(s): BNP, PROBNP in the last 168 hours.   DDimer No results for input(s): DDIMER in the last 168 hours.   Radiology    Dg Chest 2 View  Result Date: 11/23/2017 CLINICAL DATA:  Dementia.  Chest pain. EXAM: CHEST  2 VIEW COMPARISON:  11/15/2017 FINDINGS: Aortic atherosclerosis and mild cardiac enlargement. No pleural effusion or edema identified. No airspace opacities identified. Degenerative disc disease noted within the thoracic spine. IMPRESSION: 1. No acute cardiopulmonary abnormalities. 2.  Aortic Atherosclerosis (ICD10-I70.0). Electronically Signed   By: Veronda Prudeaylor  Stroud M.D.  On: 11/23/2017 12:05   Cardiac Studies   Echo 11/24/2017  - Left ventricle: The cavity size was normal. There was moderate   concentric hypertrophy. Systolic function was moderately to   severely reduced. The estimated ejection fraction was in the   range of 30% to 35%. Diffuse hypokinesis. Doppler parameters are   consistent with abnormal left ventricular relaxation (grade 1   diastolic dysfunction). There was no evidence of elevated   ventricular filling pressure by Doppler parameters. - Aortic valve: There was no regurgitation. - Aortic root: The aortic root was normal in size. - Mitral valve: There was mild regurgitation. Valve area by   pressure half-time: 2.42 cm^2. - Right ventricle: Systolic function was normal. - Right atrium: The atrium was normal in size. - Tricuspid valve: There was mild regurgitation. - Pulmonary arteries: Systolic pressure was within the  normal   range. - Pericardium, extracardiac: There was no pericardial effusion.  Impressions:  - Since the last study on 08/31/2018 LVEF has decreased from 55-60%   to 30-35% with diffuse hypokinesis.   Patient Profile     83 y.o. female with no prior CAD, hx of and recent dx of chronic femoral DVT (11/07/17, now on low-dose Eliquis), dementia with transient confusion, hypertension and acute renal failure who is being seen today for the evaluation of chest pain at the request of Dr. Estell Harpin.   Assessment & Plan    1. Chest pain with elevated troponin: -Trop 0.12 --> 0.41->0.41->0.41 -Troponin with flat trend most probably sec to hypertensive urgency - the patient has new LV dysfunction with LVEF 30-35% and diffuse hypokinesis  -EKG without acute ST-T wave changes, however diffuse artifact -She is now chest pain free - given advanced dementia (she sometimes doesn't recognize her family members), limited mobility and the fact that this is her first episode of chest pain I would focus on BP control - I will increase lisinopril to 5 mg po daily  2. Hypertensive urgency - as above  3. Cardiomyopathy - new LVEF 30-35%, most probably sec to poorly controlled HTN - on carvedilol - increase lisinopril   4. Recent dx chronic femoral DVT: -Recent dx of chronic femoral DVT, on Eliquis 2.5mg   -ASA lifelong per MD note>> unclear if she has been taking this,>>>she was given 324mg  ASA this admission  -Followed by Dr. Randie Heinz with further recommendations to utilize compression stockings and LE elevation per office note  5. Acute on chronic renal insufficiency: -Cr, 1.61>1.50>1.15 yesterday  -Baseline appears to be in the 1.5 range  5. Dementia: -On memantine-Donepezil 28-10mg    6. HLD: -Continue statin  7. HTN: -Stable, 153/78>142/74>153/91, pt has not had her medications today -Clonidine 0.1mg  PO BID -On home nifedipine 30mg   For questions or updates, please contact [facility] Please consult [email or web address] for contact info under Cardiology/STEMI.     Signed, Tobias Alexander, MD  11/25/2017, 9:14 AM

## 2017-11-25 NOTE — Care Management Obs Status (Signed)
MEDICARE OBSERVATION STATUS NOTIFICATION   Patient Details  Name: [name] [name] MRN: [id number] Date of Birth: [birth year]   Medicare Observation Status Notification Given:  Yes    Lawerance Sabalebbie Mickaela Starlin, RN 11/25/2017, 8:44 AM

## 2017-11-26 DIAGNOSIS — R079 Chest pain, unspecified: Secondary | ICD-10-CM | POA: Diagnosis not present

## 2017-11-26 DIAGNOSIS — N289 Disorder of kidney and ureter, unspecified: Secondary | ICD-10-CM | POA: Diagnosis not present

## 2017-11-26 DIAGNOSIS — R748 Abnormal levels of other serum enzymes: Secondary | ICD-10-CM | POA: Diagnosis not present

## 2017-11-26 DIAGNOSIS — I200 Unstable angina: Secondary | ICD-10-CM | POA: Diagnosis not present

## 2017-11-26 DIAGNOSIS — I82511 Chronic embolism and thrombosis of right femoral vein: Secondary | ICD-10-CM | POA: Diagnosis not present

## 2017-11-26 LAB — BASIC METABOLIC PANEL
Anion gap: 8 (ref 5–15)
BUN: 20 mg/dL (ref 6–20)
CO2: 25 mmol/L (ref 22–32)
Calcium: 8.7 mg/dL — ABNORMAL LOW (ref 8.9–10.3)
Chloride: 103 mmol/L (ref 101–111)
Creatinine, Ser: 1.49 mg/dL — ABNORMAL HIGH (ref 0.44–1.00)
GFR calc Af Amer: 37 mL/min — ABNORMAL LOW (ref >60)
GFR calc non Af Amer: 32 mL/min — ABNORMAL LOW (ref >60)
Glucose, Bld: 102 mg/dL — ABNORMAL HIGH (ref 65–99)
Potassium: 3.7 mmol/L (ref 3.5–5.1)
Sodium: 136 mmol/L (ref 135–145)

## 2017-11-26 MED ORDER — CARVEDILOL 3.125 MG PO TABS
3.1250 mg | ORAL_TABLET | Freq: Two times a day (BID) | ORAL | Status: DC
  Administered 2017-11-26 – 2017-11-27 (×3): 3.125 mg via ORAL
  Filled 2017-11-26 (×3): qty 1

## 2017-11-26 MED ORDER — AMLODIPINE BESYLATE 10 MG PO TABS
10.0000 mg | ORAL_TABLET | Freq: Every day | ORAL | Status: DC
  Administered 2017-11-27: 10 mg via ORAL
  Filled 2017-11-26: qty 1

## 2017-11-26 NOTE — Progress Notes (Addendum)
Progress Note  Patient Name: [name] [name] Date of Encounter: 11/26/2017  Primary Cardiologist: No primary care provider on file.  Subjective   Feeling well this morning. Family at the bedside.   Inpatient Medications    Scheduled Meds: . [START ON 11/27/2017] amLODipine  10 mg Oral Daily  . apixaban  2.5 mg Oral BID  . atorvastatin  20 mg Oral QHS  . carvedilol  6.25 mg Oral BID WC  . lisinopril  5 mg Oral Daily  . mirtazapine  7.5 mg Oral QHS  . nitroGLYCERIN  1 inch Topical Q6H   Continuous Infusions:  PRN Meds: acetaminophen, acetaminophen-codeine, magnesium hydroxide, ondansetron (ZOFRAN) IV   Vital Signs    Vitals:   11/25/17 1053 11/25/17 2004 11/25/17 2151 11/26/17 0611  BP: (!) 154/73 123/60 (!) 112/59 (!) 127/59  Pulse: 62 (!) 55  (!) 45  Resp: 18 18    Temp: 98.6 F (37 C) 99.3 F (37.4 C)    TempSrc: Oral Tympanic    SpO2: 99% 98%    Weight:    155 lb 6.4 oz (70.5 kg)  Height:       No intake or output data in the 24 hours ending 11/26/17 1152 Filed Weights   11/24/17 0349 11/25/17 0338 11/26/17 0611  Weight: 150 lb 8 oz (68.3 kg) 150 lb 5.7 oz (68.2 kg) 155 lb 6.4 oz (70.5 kg)    Telemetry    SB - Personally Reviewed  ECG    N/a - Personally Reviewed  Physical Exam   General: Well developed, well nourished, older AA female appearing in no acute distress. Head: Normocephalic, atraumatic.  Neck: Supple, no JVD. Lungs:  Resp regular and unlabored, CTA. Heart: RRR, S1, S2, no S3, S4, soft systolic murmur; no rub. Abdomen: Soft, non-tender, non-distended with normoactive bowel sounds. No hepatomegaly. No rebound/guarding. No obvious abdominal masses. Extremities: No clubbing, cyanosis, edema. Distal pedal pulses are 2+ bilaterally. Neuro: Alert and oriented X 3. Moves all extremities spontaneously. Psych: Normal affect.  Labs    Chemistry Recent Labs  Lab 11/23/17 1122 11/26/17 0853  NA 137 136  K 3.9 3.7  CL 107 103  CO2 21* 25    GLUCOSE 108* 102*  BUN 11 20  CREATININE 1.15* 1.49*  CALCIUM 8.9 8.7*  PROT 7.6  --   ALBUMIN 2.7*  --   AST 24  --   ALT 17  --   ALKPHOS 81  --   BILITOT 0.6  --   GFRNONAA 44* 32*  GFRAA 51* 37*  ANIONGAP 9 8     Hematology Recent Labs  Lab 11/23/17 1122  WBC 5.9  RBC 3.68*  HGB 9.5*  HCT 30.5*  MCV 82.9  MCH 25.8*  MCHC 31.1  RDW 17.2*  PLT 386    Cardiac Enzymes Recent Labs  Lab 11/23/17 1523 11/23/17 2126 11/24/17 0330  TROPONINI 0.41* 0.41* 0.41*    Recent Labs  Lab 11/23/17 1139 11/23/17 1449  TROPIPOC 0.18* 0.15*     BNPNo results for input(s): BNP, PROBNP in the last 168 hours.   DDimer No results for input(s): DDIMER in the last 168 hours.   Lipid Panel  No results found for: CHOL, TRIG, HDL, CHOLHDL, VLDL, LDLCALC, LDLDIRECT  Radiology    No results found.  Cardiac Studies   TTE: 11/24/17  Study Conclusions  - Left ventricle: The cavity size was normal. There was moderate   concentric hypertrophy. Systolic function was moderately to  severely reduced. The estimated ejection fraction was in the   range of 30% to 35%. Diffuse hypokinesis. Doppler parameters are   consistent with abnormal left ventricular relaxation (grade 1   diastolic dysfunction). There was no evidence of elevated   ventricular filling pressure by Doppler parameters. - Aortic valve: There was no regurgitation. - Aortic root: The aortic root was normal in size. - Mitral valve: There was mild regurgitation. Valve area by   pressure half-time: 2.42 cm^2. - Right ventricle: Systolic function was normal. - Right atrium: The atrium was normal in size. - Tricuspid valve: There was mild regurgitation. - Pulmonary arteries: Systolic pressure was within the normal   range. - Pericardium, extracardiac: There was no pericardial effusion.  Impressions:  - Since the last study on 08/31/2018 LVEF has decreased from 55-60%   to 30-35% with diffuse  hypokinesis.  Patient Profile     83 y.o. female  with no prior CAD, hx of and recentdxof chronic femoralDVT (11/07/17,now on low-dose Eliquis), dementia with transient confusion,hypertension and acute renal failure who presented with chest pain.   Assessment & Plan    1.Chest pain with elevated troponin: -Trop 0.12 --> 0.41->0.41->0.41, Suspect troponin probably sec to hypertensive urgency - the patient has new LV dysfunction with LVEF 30-35% and diffuse hypokinesis  -EKG without acute ST-T wave changes, however diffuse artifact -She is now chest pain free - Per Dr. Delton SeeNelson- given advanced dementia (she sometimes doesn't recognize her family members), limited mobility and the fact that this is her first episode of chest pain would pain for medical therapy and blood pressure control.  2. Hypertensive urgency - improved, but now bradycardic on telemetry - reduce coreg to 3.125mg  BID  3. Cardiomyopathy - new LVEF 30-35%, most probably sec to poorly controlled HTN - on carvedilol and lisinopril   4. Recent dx chronic femoral DVT: -Recent dx of chronic femoral DVT, on Eliquis 2.5mg   -Followed by Dr. Randie Heinzain with further recommendations to utilize compression stockings and LE elevation per office note  5. Acute on chronic renal insufficiency: -Cr 1.49 -Baseline appears to be in the 1.5 range  5. Dementia: -On memantine-Donepezil 28-10mg    6. HLD: -Continue statin   Signed, Laverda PageLindsay Roberts, NP  11/26/2017, 11:52 AM  Pager # [phone]   For questions or updates, please contact [facility] Please consult [email or web address] for contact info under Cardiology/STEMI.   Patient seen and examined. Agree with assessment and plan. No recurrent chest pain. ECG without acute STT changes; Troponin mildly elevated with plateau pattern suggestive of demand ischemia. Fweels well. No edema. BP controlled today.   Lennette Biharihomas A. Kelly, MD, [facility] HealthFACC 11/26/2017 12:44 PM

## 2017-11-26 NOTE — Progress Notes (Signed)
[facility] PROGRESS NOTE  RAFEEF LAU [name]:[id number] DOB: [birth year] DOA: 11/23/2017 PCP: Andi Devon, MD  Brief summary    83 y.o. female, with history of dementia, hypertension, dvt on eliques, chronic back pain was brought to hospital after patient complained of chest pain this morning. Patient is a poor historian so history obtained from daughter at bedside. As per daughter this morning patient complains of chest pain which was persistent so she called 911. There was no history of shortness of breath. No nausea vomiting or diarrhea. Patient does not have previous history of CAD. Denies fever or chills. No abdominal pain. No dysuria, urgency or frequency of urination.  In the ED patient was seen by cardiology. Recommended observation for second troponin and echo     Assessment/Plan:  Chest pain. Atypical presentation. No recurrence.  patient had mild elevation of troponin picked at 0.41 which has remained flat probably in the setting of hypertensive urgency and low EF. Echocardiogram: The estimated ejection fraction was in the   range of 30% to 35%. Diffuse hypokinesis. Awaiting further cardiology recommendations. Continue Coreg, statin, aspirin, ntg Given advanced dementia, we will focus on blood pressure control and not order any ischemic workup at this point  Cardiomyopathy-EF of 30-35% in the setting of poorly controlled hypertension  Hypertensive urgency, improving Blood pressure has been stable Clonidine discontinued due to bradycardia Coreg with hold parameters Patient placed on lisinopril 5 mg a day by cardiology Monitor renal function closely as it is increasing Increase Norvasc to 10 mg a day   History of femoral DVT-continue eliquis   .  Hyperlipidemia- continue Lipitor  Dementia-stable, no bladder disturbance. Continue Remeron  Chronic kidney disease stage III- creatinine fluctuating 1.5.1.61>1.50>1.15 >1.49  Hypokalemia-replete  potassium     Code Status: full Family Communication: d/w patient, her family (indicate person spoken with, relationship, and if by phone, the number) Disposition Plan: pend further work up , PT OT evaluation   Consultants:  Cardiology   Procedures:  Echo   Antibiotics:  none (indicate start date, and stop date if known)  HPI/Subjective: Patient is sleeping but  patient's daughter who is by the bedside states that she has been doing well. Heart rate was low this morning and therefore Coreg was held  Objective: Vitals:   11/25/17 2151 11/26/17 0611  BP: (!) 112/59 (!) 127/59  Pulse:  (!) 45  Resp:    Temp:    SpO2:     No intake or output data in the 24 hours ending 11/26/17 1044 Filed Weights   11/24/17 0349 11/25/17 0338 11/26/17 0611  Weight: 68.3 kg (150 lb 8 oz) 68.2 kg (150 lb 5.7 oz) 70.5 kg (155 lb 6.4 oz)    Exam:   General: alert. No distress   Cardiovascular: s1,s2 rrr  Respiratory: CTA BL  Abdomen: soft, nt, dn   Musculoskeletal: no leg edema    Data Reviewed: Basic Metabolic Panel: Recent Labs  Lab 11/23/17 1122 11/26/17 0853  NA 137 136  K 3.9 3.7  CL 107 103  CO2 21* 25  GLUCOSE 108* 102*  BUN 11 20  CREATININE 1.15* 1.49*  CALCIUM 8.9 8.7*   Liver Function Tests: Recent Labs  Lab 11/23/17 1122  AST 24  ALT 17  ALKPHOS 81  BILITOT 0.6  PROT 7.6  ALBUMIN 2.7*   No results for input(s): LIPASE, AMYLASE in the last 168 hours. No results for input(s): AMMONIA in the last 168 hours. CBC: Recent Labs  Lab 11/23/17  1122  WBC 5.9  HGB 9.5*  HCT 30.5*  MCV 82.9  PLT 386   Cardiac Enzymes: Recent Labs  Lab 11/23/17 1523 11/23/17 2126 11/24/17 0330  TROPONINI 0.41* 0.41* 0.41*   BNP (last 3 results) No results for input(s): BNP in the last 8760 hours.  ProBNP (last 3 results) No results for input(s): PROBNP in the last 8760 hours.  CBG: Recent Labs  Lab 11/23/17 1119  GLUCAP 107*    No results found for  this or any previous visit (from the past 240 hour(s)).   Studies: No results found.  Scheduled Meds: . [START ON 11/27/2017] amLODipine  10 mg Oral Daily  . apixaban  2.5 mg Oral BID  . atorvastatin  20 mg Oral QHS  . carvedilol  6.25 mg Oral BID WC  . lisinopril  5 mg Oral Daily  . mirtazapine  7.5 mg Oral QHS  . nitroGLYCERIN  1 inch Topical Q6H   Continuous Infusions:  Principal Problem:   Chest pain Active Problems:   Dementia   Elevated troponin   DVT, femoral, chronic (HCC)   Acute on chronic renal insufficiency   Hypertensive urgency    Time spent: >35 minutes     Richarda OverlieNayana Jimmy Stipes  [facility] Pager [phone]. If 7PM-7AM, please contact night-coverage at [email or web address], password [email or web address] 11/26/2017, 10:44 AM  LOS: 0 days

## 2017-11-26 NOTE — Plan of Care (Signed)
  Clinical Measurements: Ability to maintain clinical measurements within normal limits will improve 11/26/2017 0104 - Progressing by Elnita Maxwellodoo, Jazel Nimmons A, RN Will remain free from infection 11/26/2017 0104 - Progressing by Elnita Maxwellodoo, Carmellia Kreisler A, RN   Clinical Measurements: Will remain free from infection 11/26/2017 0104 - Progressing by Elnita Maxwellodoo, Ulysess Witz A, RN   Activity: Risk for activity intolerance will decrease 11/26/2017 0104 - Progressing by Elnita Maxwellodoo, Manoah Deckard A, RN   Pain Managment: General experience of comfort will improve 11/26/2017 0104 - Progressing by Elnita Maxwellodoo, Vetta Couzens A, RN   Skin Integrity: Risk for impaired skin integrity will decrease 11/26/2017 0104 - Progressing by Elnita Maxwellodoo, Lavra Imler A, RN

## 2017-11-26 NOTE — Evaluation (Signed)
Physical Therapy Evaluation Patient Details Name: [name] [name] MRN: [id number] DOB: Feb 07, 1935 Today's Date: 11/26/2017   History of Present Illness  83yo female with dementia brought to the ED with chest pain. PMHA OA, dementia, HTN, hx back surgery   Clinical Impression  Noted elevated troponins and spoke to attending MD, who reports patient is OK to participate in PT. Patient was received in bed, daughter present who provided PLOF/home/equipment history today; at baseline, patient lives with her daughters and does require varying levels of assistance depending on if she is having a good or bad day pain wise. She is able to perform functional bed mobility with Min guard and requires very light MinA for safe functional transfers and gait today. Assistance during gait primarily provided for navigation and safety, patient with difficulty in navigating obstacles with RW this morning. She was left in bed with HOB elevated and daughter assisting her with breakfast, bed alarm set, all needs otherwise met this morning. She will continue to benefit from skilled PT services in the acute setting as well skilled HHPT services to further address functional deficits moving forward.     Follow Up Recommendations Home health PT    Equipment Recommendations  Other (comment)(shower seat )    Recommendations for Other Services       Precautions / Restrictions Precautions Precautions: Other (comment);Fall Precaution Comments: dementia  Restrictions Weight Bearing Restrictions: No      Mobility  Bed Mobility Overal bed mobility: Needs Assistance Bed Mobility: Supine to Sit     Supine to sit: Min guard     General bed mobility comments: min guard and VC for safety and sequencing  Transfers Overall transfer level: Needs assistance Equipment used: Rolling walker (2 wheeled) Transfers: Sit to/from Stand Sit to Stand: Min assist         General transfer comment: very light MinA for functional  transfers today, cues for safety and sequencing   Ambulation/Gait Ambulation/Gait assistance: Min assist Ambulation Distance (Feet): 50 Feet Assistive device: Rolling walker (2 wheeled) Gait Pattern/deviations: Step-through pattern;Decreased step length - right;Decreased step length - left;Decreased stride length;Drifts right/left;Trunk flexed     General Gait Details: MinA primarily for navigation and safety with RW; note HR 50BPM at rest in supine, increased to 58-60BPM with activity   Stairs            Wheelchair Mobility    Modified Rankin (Stroke Patients Only)       Balance Overall balance assessment: Mild deficits observed, not formally tested                                           Pertinent Vitals/Pain Pain Assessment: Faces Pain Score: 0-No pain Faces Pain Scale: No hurt Pain Intervention(s): Limited activity within patient's tolerance;Monitored during session    Home Living Family/patient expects to be discharged to:: Private residence Living Arrangements: Children Available Help at Discharge: Family;Available 24 hours/day Type of Home: House Home Access: Stairs to enter Entrance Stairs-Rails: Can reach both Entrance Stairs-Number of Steps: 8 Home Layout: One level Home Equipment: Walker - 2 wheels;Bedside commode      Prior Function Level of Independence: Needs assistance   Gait / Transfers Assistance Needed: family has been offering HHA  ADL's / Homemaking Assistance Needed: lives with daughter who works and another daughter who is there during the day to help care for her bath,  dressing  Comments: family present and reports PLOF/home/equipment status; reports patient has good and bad days, on good days she moves very well, mobility on bad days (due to back pain) can be a much different story      Hand Dominance        Extremity/Trunk Assessment   Upper Extremity Assessment Upper Extremity Assessment: Defer to OT  evaluation    Lower Extremity Assessment Lower Extremity Assessment: Generalized weakness    Cervical / Trunk Assessment Cervical / Trunk Assessment: Kyphotic  Communication   Communication: No difficulties  Cognition Arousal/Alertness: Awake/alert Behavior During Therapy: WFL for tasks assessed/performed Overall Cognitive Status: Within Functional Limits for tasks assessed                                        General Comments General comments (skin integrity, edema, etc.): HR 50BPM at rest, increased to 58-60BPM with gait approximately 11f     Exercises     Assessment/Plan    PT Assessment Patient needs continued PT services  PT Problem List Decreased strength;Decreased mobility;Decreased safety awareness;Decreased coordination;Decreased activity tolerance;Decreased balance       PT Treatment Interventions DME instruction;Therapeutic activities;Therapeutic exercise;Gait training;Patient/family education;Stair training;Balance training;Functional mobility training;Neuromuscular re-education    PT Goals (Current goals can be found in the Care Plan section)  Acute Rehab PT Goals Patient Stated Goal: to go home  PT Goal Formulation: With family Time For Goal Achievement: 12/10/17 Potential to Achieve Goals: Good    Frequency Min 3X/week   Barriers to discharge        Co-evaluation               AM-PAC PT "6 Clicks" Daily Activity  Outcome Measure Difficulty turning over in bed (including adjusting bedclothes, sheets and blankets)?: None Difficulty moving from lying on back to sitting on the side of the bed? : None Difficulty sitting down on and standing up from a chair with arms (e.g., wheelchair, bedside commode, etc,.)?: None Help needed moving to and from a bed to chair (including a wheelchair)?: A Little Help needed walking in hospital room?: A Little Help needed climbing 3-5 steps with a railing? : A Lot 6 Click Score: 20    End of  Session Equipment Utilized During Treatment: Gait belt Activity Tolerance: Patient tolerated treatment well Patient left: in bed;with call bell/phone within reach;with family/visitor present;with bed alarm set   PT Visit Diagnosis: Unsteadiness on feet (R26.81);Muscle weakness (generalized) (M62.81);Difficulty in walking, not elsewhere classified (R26.2);Pain Pain - Right/Left: (back pain ) Pain - part of body: (back pain )    Time: 11224-8250PT Time Calculation (min) (ACUTE ONLY): 20 min   Charges:   PT Evaluation $PT Eval Low Complexity: 1 Low     PT G Codes:        KDeniece ReePT, DPT, CBIS  Supplemental Physical Therapist [place]  Pager [phone]

## 2017-11-27 DIAGNOSIS — I82511 Chronic embolism and thrombosis of right femoral vein: Secondary | ICD-10-CM

## 2017-11-27 DIAGNOSIS — F0150 Vascular dementia without behavioral disturbance: Secondary | ICD-10-CM | POA: Diagnosis not present

## 2017-11-27 DIAGNOSIS — I160 Hypertensive urgency: Secondary | ICD-10-CM | POA: Diagnosis not present

## 2017-11-27 DIAGNOSIS — I200 Unstable angina: Secondary | ICD-10-CM | POA: Diagnosis not present

## 2017-11-27 MED ORDER — LISINOPRIL 5 MG PO TABS: 5.0000 mg | ORAL_TABLET | Freq: Every day | ORAL | 0 refills | Status: DC

## 2017-11-27 MED ORDER — AMLODIPINE BESYLATE 10 MG PO TABS: 10.0000 mg | ORAL_TABLET | Freq: Every day | ORAL | 0 refills | Status: AC

## 2017-11-27 MED ORDER — CARVEDILOL 3.125 MG PO TABS: 3.1250 mg | ORAL_TABLET | Freq: Two times a day (BID) | ORAL | 0 refills | Status: DC

## 2017-11-27 MED ORDER — DOCUSATE SODIUM 100 MG PO CAPS
100.0000 mg | ORAL_CAPSULE | Freq: Every day | ORAL | Status: DC
  Administered 2017-11-27: 100 mg via ORAL
  Filled 2017-11-27: qty 1

## 2017-11-27 MED ORDER — DOCUSATE SODIUM 100 MG PO CAPS: 100.0000 mg | ORAL_CAPSULE | Freq: Every day | ORAL | 0 refills | Status: DC

## 2017-11-27 NOTE — Discharge Summary (Signed)
Physician Discharge Summary  [name] GammonSusie W [name] ZOX:[id number] DOB: 1934/11/19 DOA: 11/23/2017  PCP: Andi DevonShelton, Kimberly, MD  Admit date: 11/23/2017 Discharge date: 11/27/2017   Recommendations for Outpatient Follow-Up:   1. Cardiology follow up 2. 24 hour supervision by family 3. BMP 1 week by PCP    Discharge Diagnosis:   Principal Problem:   Chest pain Active Problems:   Dementia   Elevated troponin   DVT, femoral, chronic (HCC)   Acute on chronic renal insufficiency   Hypertensive urgency   Discharge disposition:  Home with 24 hour supervision  Discharge Condition: Improved.  Diet recommendation: Low sodium, heart healthy  Wound care: None.   History of Present Illness:   [name] ShamsSusie [name]  is a 83 y.o. female, with history of dementia, hypertension, chronic back pain was brought to hospital after patient complained of chest pain this morning. Patient is a poor historian so history obtained from daughter at bedside. As per daughter this morning patient complains of chest pain which was persistent so she called 911. There was no history of shortness of breath. No nausea vomiting or diarrhea. Patient does not have previous history of CAD. Denies fever or chills. No abdominal pain. No dysuria, urgency or frequency of urination.  In the ED patient was seen by cardiology. Recommended observation for second troponin and echo  in a.m.     Hospital Course by Problem:   Chest pain. Atypical presentation. No recurrence.  patient had mild elevation of troponin peaked at 0.41 which has remained flat probably in the setting of hypertensive urgency and low EF. Echocardiogram: The estimated ejection fraction was in the range of 30% to 35%. Diffuse hypokinesis - Continue Coreg, statin, aspirin, ntg -per cards: Given advanced dementia, we will focus on blood pressure control and not order any ischemic workup at this point  Cardiomyopathy-EF of 30-35% in the setting of poorly  controlled hypertension  Hypertensive urgency, improving Blood pressure has been stable Clonidine discontinued due to bradycardia Coreg with hold parameters  lisinopril 5 mg a day by cardiology Norvasc to 10 mg a day  History of femoral DVT -continue eliquis   Hyperlipidemia-continue Lipitor  Dementia -stable -Remeron  Chronic kidney disease stage III -baseline 1.5 -outpatient follow up  Hypokalemia- -repleted      Medical Consultants:    cards   Discharge Exam:   Vitals:   11/27/17 1119 11/27/17 1156  BP: (!) 172/70 (!) 153/70  Pulse: (!) 57 (!) 55  Resp:    Temp:  98.6 F (37 C)  SpO2:  98%   Vitals:   11/27/17 0618 11/27/17 0858 11/27/17 1119 11/27/17 1156  BP:  (!) 170/83 (!) 172/70 (!) 153/70  Pulse: 68  (!) 57 (!) 55  Resp:      Temp:    98.6 F (37 C)  TempSrc:    Oral  SpO2:    98%  Weight:      Height:        Gen:  NAD   The results of significant diagnostics from this hospitalization (including imaging, microbiology, ancillary and laboratory) are listed below for reference.     Procedures and Diagnostic Studies:   Dg Chest 2 View  Result Date: 11/23/2017 CLINICAL DATA:  Dementia.  Chest pain. EXAM: CHEST  2 VIEW COMPARISON:  11/15/2017 FINDINGS: Aortic atherosclerosis and mild cardiac enlargement. No pleural effusion or edema identified. No airspace opacities identified. Degenerative disc disease noted within the thoracic spine. IMPRESSION: 1. No acute cardiopulmonary abnormalities. 2.  Aortic Atherosclerosis (ICD10-I70.0). Electronically Signed   By: Signa Kell M.D.   On: 11/23/2017 12:05     Labs:   Basic Metabolic Panel: Recent Labs  Lab 11/23/17 1122 11/26/17 0853  NA 137 136  K 3.9 3.7  CL 107 103  CO2 21* 25  GLUCOSE 108* 102*  BUN 11 20  CREATININE 1.15* 1.49*  CALCIUM 8.9 8.7*   GFR Estimated Creatinine Clearance: 25.3 mL/min (A) (by C-G formula based on SCr of 1.49 mg/dL (H)). Liver Function  Tests: Recent Labs  Lab 11/23/17 1122  AST 24  ALT 17  ALKPHOS 81  BILITOT 0.6  PROT 7.6  ALBUMIN 2.7*   No results for input(s): LIPASE, AMYLASE in the last 168 hours. No results for input(s): AMMONIA in the last 168 hours. Coagulation profile No results for input(s): INR, PROTIME in the last 168 hours.  CBC: Recent Labs  Lab 11/23/17 1122  WBC 5.9  HGB 9.5*  HCT 30.5*  MCV 82.9  PLT 386   Cardiac Enzymes: Recent Labs  Lab 11/23/17 1523 11/23/17 2126 11/24/17 0330  TROPONINI 0.41* 0.41* 0.41*   BNP: Invalid input(s): POCBNP CBG: Recent Labs  Lab 11/23/17 1119  GLUCAP 107*   D-Dimer No results for input(s): DDIMER in the last 72 hours. Hgb A1c No results for input(s): HGBA1C in the last 72 hours. Lipid Profile No results for input(s): CHOL, HDL, LDLCALC, TRIG, CHOLHDL, LDLDIRECT in the last 72 hours. Thyroid function studies No results for input(s): TSH, T4TOTAL, T3FREE, THYROIDAB in the last 72 hours.  Invalid input(s): FREET3 Anemia work up No results for input(s): VITAMINB12, FOLATE, FERRITIN, TIBC, IRON, RETICCTPCT in the last 72 hours. Microbiology No results found for this or any previous visit (from the past 240 hour(s)).   Discharge Instructions:   Discharge Instructions    Diet - low sodium heart healthy   Complete by:  As directed    Discharge instructions   Complete by:  As directed    Bowel regimen for daily BMs BMP with PCP re: CR   Increase activity slowly   Complete by:  As directed      Allergies as of 11/27/2017   No Known Allergies     Medication List    STOP taking these medications   cloNIDine 0.1 MG tablet Commonly known as:  CATAPRES   NAMZARIC 28-10 MG Cp24 Generic drug:  Memantine HCl-Donepezil HCl   NIFEdipine 30 MG 24 hr tablet Commonly known as:  PROCARDIA-XL/ADALAT CC     TAKE these medications   acetaminophen-codeine 300-30 MG tablet Commonly known as:  TYLENOL #3 Take 1-2 tablets by mouth every 6  (six) hours as needed for moderate pain.   amLODipine 10 MG tablet Commonly known as:  NORVASC Take 1 tablet (10 mg total) by mouth daily. Start taking on:  11/28/2017   atorvastatin 20 MG tablet Commonly known as:  LIPITOR Take 20 mg by mouth daily.   carvedilol 3.125 MG tablet Commonly known as:  COREG Take 1 tablet (3.125 mg total) by mouth 2 (two) times daily with a meal.   CoQ10 100 MG Caps Take 1 tablet by mouth daily.   docusate sodium 100 MG capsule Commonly known as:  COLACE Take 1 capsule (100 mg total) by mouth daily. Start taking on:  11/28/2017   ELIQUIS 2.5 MG Tabs tablet Generic drug:  apixaban Take 2.5 mg by mouth 2 (two) times daily.   feeding supplement (ENSURE ENLIVE) Liqd Take 237 mLs by mouth  2 (two) times daily between meals.   lisinopril 5 MG tablet Commonly known as:  PRINIVIL,ZESTRIL Take 1 tablet (5 mg total) by mouth daily. Start taking on:  11/28/2017   mirtazapine 7.5 MG tablet Commonly known as:  REMERON Take 7.5 mg by mouth at bedtime.   traMADol 50 MG tablet Commonly known as:  ULTRAM Take 50 mg by mouth every 6 (six) hours as needed for pain.      Follow-up Information    Andi Devon, MD Follow up in 1 week(s).   Specialty:  Internal Medicine Why:  BMP 1 week Contact information: [address] [place] [address] [phone]            Time coordinating discharge: 35 min  Signed:  Joseph Art   [facility] 11/27/2017, 3:39 PM

## 2017-11-27 NOTE — Plan of Care (Signed)
  Clinical Measurements: Ability to maintain clinical measurements within normal limits will improve 11/27/2017 0259 - Progressing by Elnita Maxwellodoo, Elanora Quin A, RN 11/27/2017 0258 - Progressing by Elnita Maxwellodoo, Jaydien Panepinto A, RN   Nutrition: Adequate nutrition will be maintained 11/27/2017 0258 - Progressing by Elnita Maxwellodoo, Cheyane Ayon A, RN   Pain Managment: General experience of comfort will improve 11/27/2017 0259 - Progressing by Elnita Maxwellodoo, Clemon Devaul A, RN   Safety: Ability to remain free from injury will improve 11/27/2017 0259 - Progressing by Elnita Maxwellodoo, Khalib Fendley A, RN 11/27/2017 0258 - Progressing by Elnita Maxwellodoo, Abdikadir Fohl A, RN   Skin Integrity: Risk for impaired skin integrity will decrease 11/27/2017 0259 - Progressing by Elnita Maxwellodoo, Yassine Brunsman A, RN 11/27/2017 0258 - Progressing by Elnita Maxwellodoo, Louie Flenner A, RN

## 2017-11-27 NOTE — Progress Notes (Signed)
[facility] PROGRESS NOTE  [name] GammonSusie W Hagan ZOX:[id number] DOB: 1935/06/14 DOA: 11/23/2017 PCP: Andi DevonShelton, Kimberly, MD  Brief summary    83 y.o. female, with history of dementia, hypertension, dvt on eliques, chronic back pain was brought to hospital after patient complained of chest pain this morning. Patient is a poor historian so history obtained from daughter at bedside. As per daughter this morning patient complains of chest pain which was persistent so she called 911. There was no history of shortness of breath. No nausea vomiting or diarrhea. Patient does not have previous history of CAD. Denies fever or chills. No abdominal pain. No dysuria, urgency or frequency of urination.  In the ED patient was seen by cardiology. Recommended observation for second troponin and echo     Assessment/Plan:  Chest pain. Atypical presentation. No recurrence.  patient had mild elevation of troponin peaked at 0.41 which has remained flat probably in the setting of hypertensive urgency and low EF. Echocardiogram: The estimated ejection fraction was in the   range of 30% to 35%. Diffuse hypokinesis - Continue Coreg, statin, aspirin, ntg -per cards: Given advanced dementia, we will focus on blood pressure control and not order any ischemic workup at this point  Cardiomyopathy-EF of 30-35% in the setting of poorly controlled hypertension  Hypertensive urgency, improving Blood pressure has been stable Clonidine discontinued due to bradycardia Coreg with hold parameters  lisinopril 5 mg a day by cardiology Norvasc to 10 mg a day   History of femoral DVT-continue eliquis  .  Hyperlipidemia- continue Lipitor  Dementia-stable, no bladder disturbance. Continue Remeron  Chronic kidney disease stage III -baseline 1.5  Hypokalemia-replete potassium   D/c plan pending-- BP elevated but mose likely due to pain, have added a bowel regimen.  Not sure if cardiology is still following or not  Code  Status: full Family Communication: at bedside Disposition Plan: ? SNF?   Consultants:  Cardiology   Procedures:  Echo   Antibiotics:  none (indicate start date, and stop date if known)  HPI/Subjective: C/o lower abd pain  Objective: Vitals:   11/27/17 1119 11/27/17 1156  BP: (!) 172/70 (!) 153/70  Pulse: (!) 57 (!) 55  Resp:    Temp:  98.6 F (37 C)  SpO2:  98%    Intake/Output Summary (Last 24 hours) at 11/27/2017 1459 Last data filed at 11/27/2017 0200 Gross per 24 hour  Intake 240 ml  Output 1 ml  Net 239 ml   Filed Weights   11/25/17 0338 11/26/17 0611 11/27/17 0607  Weight: 68.2 kg (150 lb 5.7 oz) 70.5 kg (155 lb 6.4 oz) 68.5 kg (151 lb 1.6 oz)    Exam:   General: awake, confused-- appears uncomfortable  Cardiovascular: rrr  Respiratory: clear  Abdomen: +BS, soft, tender in lower quadrant  Musculoskeletal: no edema   Data Reviewed: Basic Metabolic Panel: Recent Labs  Lab 11/23/17 1122 11/26/17 0853  NA 137 136  K 3.9 3.7  CL 107 103  CO2 21* 25  GLUCOSE 108* 102*  BUN 11 20  CREATININE 1.15* 1.49*  CALCIUM 8.9 8.7*   Liver Function Tests: Recent Labs  Lab 11/23/17 1122  AST 24  ALT 17  ALKPHOS 81  BILITOT 0.6  PROT 7.6  ALBUMIN 2.7*   No results for input(s): LIPASE, AMYLASE in the last 168 hours. No results for input(s): AMMONIA in the last 168 hours. CBC: Recent Labs  Lab 11/23/17 1122  WBC 5.9  HGB 9.5*  HCT 30.5*  MCV  82.9  PLT 386   Cardiac Enzymes: Recent Labs  Lab 11/23/17 1523 11/23/17 2126 11/24/17 0330  TROPONINI 0.41* 0.41* 0.41*   BNP (last 3 results) No results for input(s): BNP in the last 8760 hours.  ProBNP (last 3 results) No results for input(s): PROBNP in the last 8760 hours.  CBG: Recent Labs  Lab 11/23/17 1119  GLUCAP 107*    No results found for this or any previous visit (from the past 240 hour(s)).   Studies: No results found.  Scheduled Meds: . amLODipine  10 mg Oral  Daily  . apixaban  2.5 mg Oral BID  . atorvastatin  20 mg Oral QHS  . carvedilol  3.125 mg Oral BID WC  . docusate sodium  100 mg Oral Daily  . lisinopril  5 mg Oral Daily  . mirtazapine  7.5 mg Oral QHS  . nitroGLYCERIN  1 inch Topical Q6H   Continuous Infusions:  Principal Problem:   Chest pain Active Problems:   Dementia   Elevated troponin   DVT, femoral, chronic (HCC)   Acute on chronic renal insufficiency   Hypertensive urgency    Time spent: 25 minutes     Joseph Art  [facility] . If 7PM-7AM, please contact night-coverage at [email or web address], password [facility] 11/27/2017, 2:59 PM  LOS: 0 days

## 2017-11-27 NOTE — Progress Notes (Signed)
Physical Therapy Treatment Patient Details Name: [name] [name] MRN: [id number] DOB: [birth year] Today's Date: 11/27/2017    History of Present Illness 83yo female with dementia brought to the ED with chest pain. PMHA OA, dementia, HTN, hx back surgery     PT Comments    Patient in bed sleeping but easily woken and willing to attempt PT session; note she is very pain limited and self-limiting today. She requires increased assistance due to high pain levels, attempted sit to stand but patient unable/did not put forth effort to complete transfer with PT. Daughter present during session and reports this is very typical of a "bad" day for the patient. Patient left in bed with alarm set, family present, all needs met; PT updated DC recommendation to SNF with [facility] instead or [facility] to attempt to assist family with patient management on these harder days.     Follow Up Recommendations  SNF;Other (comment)([facility] home instead or [facility] )     Equipment Recommendations  Other (comment)(shower seat )    Recommendations for Other Services       Precautions / Restrictions Precautions Precautions: Other (comment);Fall Precaution Comments: dementia  Restrictions Weight Bearing Restrictions: No    Mobility  Bed Mobility Overal bed mobility: Needs Assistance Bed Mobility: Supine to Sit;Sit to Supine     Supine to sit: Max assist Sit to supine: Max assist   General bed mobility comments: patient wtih need for increased assistance today especially to bring legs over side of bed and power trunk up   Transfers Overall transfer level: Needs assistance Equipment used: Rolling walker (2 wheeled) Transfers: Sit to/from Stand Sit to Stand: Max assist         General transfer comment: attempted multiple times, patient with very little effort and unable to safely complete transfer today   Ambulation/Gait             General Gait Details: unable, patient declined    Stairs             Wheelchair Mobility    Modified Rankin (Stroke Patients Only)       Balance Overall balance assessment: Mild deficits observed, not formally tested                                          Cognition Arousal/Alertness: Awake/alert Behavior During Therapy: WFL for tasks assessed/performed Overall Cognitive Status: Within Functional Limits for tasks assessed                                        Exercises      General Comments        Pertinent Vitals/Pain Pain Assessment: Faces Faces Pain Scale: Hurts even more Pain Location: back and neck  Pain Descriptors / Indicators: Aching;Sore;Sharp Pain Intervention(s): Limited activity within patient's tolerance;Monitored during session    Home Living                      Prior Function            PT Goals (current goals can now be found in the care plan section) Acute Rehab PT Goals Patient Stated Goal: to go home  PT Goal Formulation: With family Time For Goal Achievement: 12/10/17 Potential to Achieve Goals: Good Progress towards  PT goals: Not progressing toward goals - comment(progress towards goals limited by pain today )    Frequency    Min 3X/week      PT Plan Discharge plan needs to be updated    Co-evaluation              AM-PAC PT "6 Clicks" Daily Activity  Outcome Measure  Difficulty turning over in bed (including adjusting bedclothes, sheets and blankets)?: Unable Difficulty moving from lying on back to sitting on the side of the bed? : Unable Difficulty sitting down on and standing up from a chair with arms (e.g., wheelchair, bedside commode, etc,.)?: Unable Help needed moving to and from a bed to chair (including a wheelchair)?: A Lot Help needed walking in hospital room?: A Lot Help needed climbing 3-5 steps with a railing? : A Lot 6 Click Score: 9    End of Session Equipment Utilized During Treatment: Gait belt Activity Tolerance:  Patient limited by pain Patient left: in bed;with bed alarm set;with family/visitor present;with call bell/phone within reach   PT Visit Diagnosis: Unsteadiness on feet (R26.81);Muscle weakness (generalized) (M62.81);Difficulty in walking, not elsewhere classified (R26.2);Pain Pain - Right/Left: (back ) Pain - part of body: (back )     Time: 4997-1820 PT Time Calculation (min) (ACUTE ONLY): 24 min  Charges:  $Therapeutic Activity: 23-37 mins                    G Codes:       Deniece Ree PT, DPT, CBIS  Supplemental Physical Therapist [place]   Pager [phone]

## 2017-11-27 NOTE — Progress Notes (Signed)
PT Cancellation Note  Patient Details Name: [name] GammonSusie W Watton MRN: [id number] DOB: [birth year]   Cancelled Treatment:    Reason Eval/Treat Not Completed: Other (comment) attempted to work with patient, she is still eating breakfast and family requests that PT return later in the morning. Plan to attempt to return as/if schedule allows.    Nedra HaiKristen Anddy Wingert PT, DPT, CBIS  Supplemental Physical Therapist [facility]   Pager [phone]

## 2017-11-27 NOTE — Plan of Care (Signed)
  Clinical Measurements: Ability to maintain clinical measurements within normal limits will improve 11/27/2017 0258 - Progressing by Elnita Maxwellodoo, Latorria Zeoli A, RN   Nutrition: Adequate nutrition will be maintained 11/27/2017 0258 - Progressing by Elnita Maxwellodoo, Daianna Vasques A, RN   Safety: Ability to remain free from injury will improve 11/27/2017 0258 - Progressing by Elnita Maxwellodoo, Eli Pattillo A, RN   Skin Integrity: Risk for impaired skin integrity will decrease 11/27/2017 0258 - Progressing by Elnita Maxwellodoo, Quantay Zaremba A, RN

## 2017-11-27 NOTE — Progress Notes (Signed)
CSW met with pt and pt dtr to discuss PT new recommendation for SNF.  Dtr states that after PT worked with pt she was able to get up and go to bathroom with just dtr's assistance just stated pt needed to get herself together.  Dtr, Rosemarie Ax, states pt lives with another dtr but that she is at the house all day to assist.  They are not interested in pursuing SNF at this time and continues to prefer return home with home services  CSW signing off- RNCM updated  Jorge Ny, [place] Social Worker [phone]

## 2017-11-28 ENCOUNTER — Telehealth: Payer: Self-pay | Admitting: Interventional Cardiology

## 2017-11-28 NOTE — Telephone Encounter (Signed)
New message     Toc - Robbie LisBrittainy Simmons 12/07/17 930a

## 2017-11-29 NOTE — Telephone Encounter (Signed)
Patient contacted on 11/29/2017 @12 :18pm Patient understands to follow up with  Provider on 12/07/2017 @ 9:30am with Boyce MediciBrittany Simmons PA Patient understands discharge instructions? Family does Patient understands medications and regimen? Family does Patient understands to bring all medications to this visit? Yes  Patient has dementia, she was sleeping when I called.

## 2017-12-07 ENCOUNTER — Encounter: Payer: Self-pay | Admitting: Cardiology

## 2017-12-07 ENCOUNTER — Ambulatory Visit: Payer: Medicare Other | Admitting: Cardiology

## 2017-12-07 VITALS — BP 160/78 | HR 64 | Ht 59.0 in | Wt 146.0 lb

## 2017-12-07 DIAGNOSIS — I10 Essential (primary) hypertension: Secondary | ICD-10-CM

## 2017-12-07 DIAGNOSIS — I420 Dilated cardiomyopathy: Secondary | ICD-10-CM | POA: Diagnosis not present

## 2017-12-07 NOTE — Patient Instructions (Addendum)
Medication Instructions:   START TAKING LISINOPRIL 7.5 MG  ONCE A DAY   If you need a refill on your cardiac medications before your next appointment, please call your pharmacy.  Labwork:  NONE ORDERED  TODAY   Testing/Procedures: NONE ORDERED  TODAY   Follow-Up:  VARANASI  8-12 WEEKS   Any Other Special Instructions Will Be Listed Below (If Applicable).   Low-Sodium Eating Plan Sodium, which is an element that makes up salt, helps you maintain a healthy balance of fluids in your body. Too much sodium can increase your blood pressure and cause fluid and waste to be held in your body. Your health care provider or dietitian may recommend following this plan if you have high blood pressure (hypertension), kidney disease, liver disease, or heart failure. Eating less sodium can help lower your blood pressure, reduce swelling, and protect your heart, liver, and kidneys. What are tips for following this plan? General guidelines  Most people on this plan should limit their sodium intake to 1,500-2,000 mg (milligrams) of sodium each day. Reading food labels  The Nutrition Facts label lists the amount of sodium in one serving of the food. If you eat more than one serving, you must multiply the listed amount of sodium by the number of servings.  Choose foods with less than 140 mg of sodium per serving.  Avoid foods with 300 mg of sodium or more per serving. Shopping  Look for lower-sodium products, often labeled as "low-sodium" or "no salt added."  Always check the sodium content even if foods are labeled as "unsalted" or "no salt added".  Buy fresh foods. ? Avoid canned foods and premade or frozen meals. ? Avoid canned, cured, or processed meats  Buy breads that have less than 80 mg of sodium per slice. Cooking  Eat more home-cooked food and less restaurant, buffet, and fast food.  Avoid adding salt when cooking. Use salt-free seasonings or herbs instead of table salt or sea  salt. Check with your health care provider or pharmacist before using salt substitutes.  Cook with plant-based oils, such as canola, sunflower, or olive oil. Meal planning  When eating at a restaurant, ask that your food be prepared with less salt or no salt, if possible.  Avoid foods that contain MSG (monosodium glutamate). MSG is sometimes added to [place] food, bouillon, and some canned foods. What foods are recommended? The items listed may not be a complete list. Talk with your dietitian about what dietary choices are best for you. Grains Low-sodium cereals, including oats, puffed wheat and rice, and shredded wheat. Low-sodium crackers. Unsalted rice. Unsalted pasta. Low-sodium bread. Whole-grain breads and whole-grain pasta. Vegetables Fresh or frozen vegetables. "No salt added" canned vegetables. "No salt added" tomato sauce and paste. Low-sodium or reduced-sodium tomato and vegetable juice. Fruits Fresh, frozen, or canned fruit. Fruit juice. Meats and other protein foods Fresh or frozen (no salt added) meat, poultry, seafood, and fish. Low-sodium canned tuna and salmon. Unsalted nuts. Dried peas, beans, and lentils without added salt. Unsalted canned beans. Eggs. Unsalted nut butters. Dairy Milk. Soy milk. Cheese that is naturally low in sodium, such as ricotta cheese, fresh mozzarella, or Swiss cheese Low-sodium or reduced-sodium cheese. Cream cheese. Yogurt. Fats and oils Unsalted butter. Unsalted margarine with no trans fat. Vegetable oils such as canola or olive oils. Seasonings and other foods Fresh and dried herbs and spices. Salt-free seasonings. Low-sodium mustard and ketchup. Sodium-free salad dressing. Sodium-free light mayonnaise. Fresh or refrigerated horseradish. Lemon juice.  Vinegar. Homemade, reduced-sodium, or low-sodium soups. Unsalted popcorn and pretzels. Low-salt or salt-free chips. What foods are not recommended? The items listed may not be a complete list. Talk  with your dietitian about what dietary choices are best for you. Grains Instant hot cereals. Bread stuffing, pancake, and biscuit mixes. Croutons. Seasoned rice or pasta mixes. Noodle soup cups. Boxed or frozen macaroni and cheese. Regular salted crackers. Self-rising flour. Vegetables Sauerkraut, pickled vegetables, and relishes. Olives. [place] fries. Onion rings. Regular canned vegetables (not low-sodium or reduced-sodium). Regular canned tomato sauce and paste (not low-sodium or reduced-sodium). Regular tomato and vegetable juice (not low-sodium or reduced-sodium). Frozen vegetables in sauces. Meats and other protein foods Meat or fish that is salted, canned, smoked, spiced, or pickled. Bacon, ham, sausage, hotdogs, corned beef, chipped beef, packaged lunch meats, salt pork, jerky, pickled herring, anchovies, regular canned tuna, sardines, salted nuts. Dairy Processed cheese and cheese spreads. Cheese curds. Blue cheese. Feta cheese. String cheese. Regular cottage cheese. Buttermilk. Canned milk. Fats and oils Salted butter. Regular margarine. Ghee. Bacon fat. Seasonings and other foods Onion salt, garlic salt, seasoned salt, table salt, and sea salt. Canned and packaged gravies. Worcestershire sauce. Tartar sauce. Barbecue sauce. Teriyaki sauce. Soy sauce, including reduced-sodium. Steak sauce. Fish sauce. Oyster sauce. Cocktail sauce. Horseradish that you find on the shelf. Regular ketchup and mustard. Meat flavorings and tenderizers. Bouillon cubes. Hot sauce and Tabasco sauce. Premade or packaged marinades. Premade or packaged taco seasonings. Relishes. Regular salad dressings. Salsa. Potato and tortilla chips. Corn chips and puffs. Salted popcorn and pretzels. Canned or dried soups. Pizza. Frozen entrees and pot pies. Summary  Eating less sodium can help lower your blood pressure, reduce swelling, and protect your heart, liver, and kidneys.  Most people on this plan should limit their sodium  intake to 1,500-2,000 mg (milligrams) of sodium each day.  Canned, boxed, and frozen foods are high in sodium. Restaurant foods, fast foods, and pizza are also very high in sodium. You also get sodium by adding salt to food.  Try to cook at home, eat more fresh fruits and vegetables, and eat less fast food, canned, processed, or prepared foods. This information is not intended to replace advice given to you by your health care provider. Make sure you discuss any questions you have with your health care provider. Document Released: 03/03/2002 Document Revised: 09/04/2016 Document Reviewed: 09/04/2016 Elsevier Interactive Patient Education  [employer]2018 Elsevier Inc.

## 2017-12-07 NOTE — Progress Notes (Signed)
12/07/2017 [name] [name]   10-23-1934  [id number]  Primary Physician Andi Devon, MD Primary Cardiologist: Dr. Eldridge Dace   Reason for Visit/CC: [facility] F/u for Chest Pain, Cardiomyopathy/Systolic HF  HPI:  [name] [name] is a 83 y.o. female who is being seen today for post hospital f/u.   She has a h/o recent dx of chronic femoral DVT (11/07/17, now on low-dose Eliquis), dementia with transient confusion, hypertension, who was admitted to [facility] on 11/23/17 with chest pain. Trop 0.12 --> 0.41->0.41->0.41. EKG without acute ST-T wave changes, however diffuse artifact. Suspect troponin probably sec to hypertensive urgency. Echo was obtained and showed new LV dysfunction with LVEF 30-35% and diffuse hypokinesis. CP resolved with BP control. Given her advanced dementia (she sometimes doesn't recognize her family members), limited  mobility and the fact that this was her first episode of chest pain, conservative treatment and medical therapy for BP control recommended. Pt was discharged home on Coreg 3.125 mg BID, lisinopril 5 mg and amlodipine 10 mg.  Pt presents to clinic today for post hospital follow-up.  She is here with her daughter, whom she lives with.  She reports that she has done well from a cardiac standpoint since discharge.  She denies any recurrent chest pain.  She also denies dyspnea, orthopnea, PND and lower extremity edema.  She reports full medication compliance.  Her daughter helps her with her medications.  Her blood pressure this morning is 160/78 however she reports that she just took her medications 30 minutes prior to this appointment.  Heart Rate 64 bpm.  She reports that she was just seen by her PCP earlier this week and her lisinopril dose was increased to 7.5 mg daily however the patient's daughter has not yet picked this up from the pharmacy.  They plan to pick up the new prescription later today.  They have follow-up with her PCP in 3 more weeks to recheck blood  pressure on April 4.   Cardiac Studies   TTE: 11/24/17  Study Conclusions  - Left ventricle: The cavity size was normal. There was moderate concentric hypertrophy. Systolic function was moderately to severely reduced. The estimated ejection fraction was in the range of 30% to 35%. Diffuse hypokinesis. Doppler parameters are consistent with abnormal left ventricular relaxation (grade 1 diastolic dysfunction). There was no evidence of elevated ventricular filling pressure by Doppler parameters. - Aortic valve: There was no regurgitation. - Aortic root: The aortic root was normal in size. - Mitral valve: There was mild regurgitation. Valve area by pressure half-time: 2.42 cm^2. - Right ventricle: Systolic function was normal. - Right atrium: The atrium was normal in size. - Tricuspid valve: There was mild regurgitation. - Pulmonary arteries: Systolic pressure was within the normal range. - Pericardium, extracardiac: There was no pericardial effusion.  Impressions:  - Since the last study on 08/31/2018 LVEF has decreased from 55-60% to 30-35% with diffuse hypokinesis.   Current Meds  Medication Sig  . acetaminophen-codeine (TYLENOL #3) 300-30 MG tablet Take 1-2 tablets by mouth every 6 (six) hours as needed for moderate pain.   Marland Kitchen amLODipine (NORVASC) 10 MG tablet Take 1 tablet (10 mg total) by mouth daily.  Marland Kitchen atorvastatin (LIPITOR) 20 MG tablet Take 20 mg by mouth daily.  . carvedilol (COREG) 3.125 MG tablet Take 1 tablet (3.125 mg total) by mouth 2 (two) times daily with a meal.  . Coenzyme Q10 (COQ10) 100 MG CAPS Take 1 tablet by mouth daily.  Marland Kitchen docusate sodium (COLACE)  100 MG capsule Take 1 capsule (100 mg total) by mouth daily.  Marland Kitchen ELIQUIS 2.5 MG TABS tablet Take 2.5 mg by mouth 2 (two) times daily.  . feeding supplement, ENSURE ENLIVE, (ENSURE ENLIVE) LIQD Take 237 mLs by mouth 2 (two) times daily between meals.  Marland Kitchen lisinopril (PRINIVIL,ZESTRIL) 5 MG tablet  Take 1 tablet (5 mg total) by mouth daily.  . traMADol (ULTRAM) 50 MG tablet Take 50 mg by mouth every 6 (six) hours as needed for pain.   No Known Allergies Past Medical History:  Diagnosis Date  . Arthritis   . Dementia   . Hypertension    Family History  Problem Relation Age of Onset  . Heart disease Father   . Heart disease Brother    Past Surgical History:  Procedure Laterality Date  . ABDOMINAL HYSTERECTOMY    . BACK SURGERY     Social History   Socioeconomic History  . Marital status: Widowed    Spouse name: Not on file  . Number of children: Not on file  . Years of education: Not on file  . Highest education level: Not on file  Social Needs  . Financial resource strain: Not on file  . Food insecurity - worry: Not on file  . Food insecurity - inability: Not on file  . Transportation needs - medical: Not on file  . Transportation needs - non-medical: Not on file  Occupational History  . Not on file  Tobacco Use  . Smoking status: Former Smoker    Types: Cigarettes    Last attempt to quit: 09/25/1976    Years since quitting: 41.2  . Smokeless tobacco: Never Used  Substance and Sexual Activity  . Alcohol use: No  . Drug use: No  . Sexual activity: No  Other Topics Concern  . Not on file  Social History Narrative  . Not on file     Review of Systems: General: negative for chills, fever, night sweats or weight changes.  Cardiovascular: negative for chest pain, dyspnea on exertion, edema, orthopnea, palpitations, paroxysmal nocturnal dyspnea or shortness of breath Dermatological: negative for rash Respiratory: negative for cough or wheezing Urologic: negative for hematuria Abdominal: negative for nausea, vomiting, diarrhea, bright red blood per rectum, melena, or hematemesis Neurologic: negative for visual changes, syncope, or dizziness All other systems reviewed and are otherwise negative except as noted above.   Physical Exam:  Blood pressure (!)  160/78, pulse 64, height 4\' 11"  (1.499 m), weight 146 lb (66.2 kg), SpO2 99 %.  General appearance: alert, cooperative and no distress Neck: no carotid bruit and no JVD Lungs: clear to auscultation bilaterally Heart: regular rate and rhythm, S1, S2 normal, no murmur, click, rub or gallop Extremities: extremities normal, atraumatic, no cyanosis or edema Pulses: 2+ and symmetric Skin: Skin color, texture, turgor normal. No rashes or lesions Neurologic: Grossly normal  EKG not performed -- personally reviewed   ASSESSMENT AND PLAN:   1. Chest Pain: symptoms resolved with better control of BP. Cardiac enzymes in the hospital were mildly elevated but leveled out w/ flat trend, again felt 2/2 hypertensive urgency. Echo showed reduced LVEF, 30-35% w/ diffuse hypokinesis. However given CP resolved with BP control, along with the fact that pt has advanced dementia (she sometimes doesn't recognize her family members) and limited mobility, we will stick with conservative management at this time and we will not pursue invasive cardiac catheterization.  We will continue medical management of her hypertension and systolic heart failure.  2.  Hypertension: better controlled compared to hospital but still slightly elevated at 160/78 this morning.  She reports that she just took her morning medications 30 minutes prior to this appointment.  Continue amlodipine 10 mg, carvedilol 3.125 mg twice daily along with lisinopril.  I agree with her PCPs recommendations to increase dose slightly from 5-7.5 mg daily.  I encouraged the patient and her daughter to keep her scheduled follow-up with her PCP in 3 more weeks to recheck blood pressure as well as recheck basic metabolic panel to make sure renal function is stable on the higher dose of lisinopril.  3.  Cardiomyopathy: New diagnosis.  Recent echocardiogram revealed reduced EF at 30-35% with diffuse hypokinesis.  No plans for invasive cardiac catheterization given the  patient's advanced dementia.  It is also suspected that this might be likely secondary to poorly controlled hypertension/hypertensive heart disease.  Continue medical management with carvedilol, lisinopril and amlodipine.  From a volume standpoint the patient appears stable.  She has no lower extremity edema on exam.  Lungs are clear to auscultation bilaterally.  She denies any dyspnea.  We discussed the importance of maintaining a low-sodium diet as well as getting in the habit of checking weight daily to monitor fluid status.  4.  Recent diagnosis of chronic femoral DVT: On low-dose Eliquis 2.5 mg twice daily.  This is being followed by Dr. Randie Heinzain.   5. HLD: on statin therapy w/ Lipitor.   6. Dementia: On memantine-Donepezil 28-10mg    Follow-Up w/ Dr. Eldridge DaceVaranasi in 8 weeks.   Johnte Portnoy Delmer IslamSimmons PA-C, [facility] 12/07/2017 9:34 AM

## 2018-01-18 ENCOUNTER — Encounter: Payer: Self-pay | Admitting: Interventional Cardiology

## 2018-02-05 ENCOUNTER — Encounter: Payer: Self-pay | Admitting: Interventional Cardiology

## 2018-02-05 ENCOUNTER — Ambulatory Visit: Payer: Medicare Other | Admitting: Interventional Cardiology

## 2018-02-05 VITALS — BP 120/76 | HR 63 | Ht 59.0 in | Wt 138.0 lb

## 2018-02-05 DIAGNOSIS — I119 Hypertensive heart disease without heart failure: Secondary | ICD-10-CM

## 2018-02-05 DIAGNOSIS — I5022 Chronic systolic (congestive) heart failure: Secondary | ICD-10-CM | POA: Diagnosis not present

## 2018-02-05 DIAGNOSIS — F0390 Unspecified dementia without behavioral disturbance: Secondary | ICD-10-CM

## 2018-02-05 DIAGNOSIS — I82511 Chronic embolism and thrombosis of right femoral vein: Secondary | ICD-10-CM | POA: Diagnosis not present

## 2018-02-05 NOTE — Patient Instructions (Signed)
Medication Instructions:  Your physician recommends that you continue on your current medications as directed. Please refer to the Current Medication list given to you today.   Labwork: None ordered  Testing/Procedures: None ordered  Follow-Up: Your physician wants you to follow-up AS NEEDED with Dr. Varanasi  Any Other Special Instructions Will Be Listed Below (If Applicable).     If you need a refill on your cardiac medications before your next appointment, please call your pharmacy.   

## 2018-02-05 NOTE — Progress Notes (Signed)
Cardiology Office Note   Date:  02/05/2018   ID:  [name] [name], DOB July 18, 1935, MRN [id number]  PCP:  Andi Devon, MD    No chief complaint on file.  Chronic systolic heart failure  Wt Readings from Last 3 Encounters:  02/05/18 138 lb (62.6 kg)  12/07/17 146 lb (66.2 kg)  11/27/17 151 lb 1.6 oz (68.5 kg)       History of Present Illness: [name] [name] is a 83 y.o. female who was hospitalized in March 2019  Records show: "she has a h/o recentdxof chronic femoralDVT (11/07/17,now on low-dose Eliquis), dementia with transient confusion,hypertension, who was admitted to [facility] on 11/23/17 with chest pain. Trop 0.12 --> 0.41->0.41->0.41. EKG without acute ST-T wave changes, however diffuse artifact. Suspect troponinprobably sec to hypertensive urgency. Echo was obtained and showed new LV dysfunction with LVEF 30-35% and diffuse hypokinesis. CP resolved with BP control. Given her advanced dementia (she sometimes doesn't recognize her family members), limited mobility and the fact that this was her first episode of chest pain, conservative treatment and medical therapy for BP control recommended. Pt was discharged home on Coreg 3.125 mg BID, lisinopril 5 mg and amlodipine 10 mg."  Since hospital discharge, BP is better.  Appetite is poor.  SHe has been losing weight.    Denies : Chest pain. Dizziness. Nitroglycerin use. Orthopnea. Palpitations. Paroxysmal nocturnal dyspnea. Shortness of breath. Syncope.   Mild LE edema intermittently  Past Medical History:  Diagnosis Date  . Arthritis   . Dementia   . Hypertension     Past Surgical History:  Procedure Laterality Date  . ABDOMINAL HYSTERECTOMY    . BACK SURGERY       Current Outpatient Medications  Medication Sig Dispense Refill  . acetaminophen-codeine (TYLENOL #3) 300-30 MG tablet Take 1-2 tablets by mouth every 6 (six) hours as needed for moderate pain.     Marland Kitchen amLODipine (NORVASC) 10 MG tablet Take 1 tablet  (10 mg total) by mouth daily. 30 tablet 0  . atorvastatin (LIPITOR) 20 MG tablet Take 20 mg by mouth daily.    . carvedilol (COREG) 3.125 MG tablet Take 1 tablet (3.125 mg total) by mouth 2 (two) times daily with a meal. 60 tablet 0  . Coenzyme Q10 (COQ10) 100 MG CAPS Take 1 tablet by mouth daily.    Marland Kitchen docusate sodium (COLACE) 100 MG capsule Take 1 capsule (100 mg total) by mouth daily. 10 capsule 0  . ELIQUIS 2.5 MG TABS tablet Take 2.5 mg by mouth 2 (two) times daily.  2  . feeding supplement, ENSURE ENLIVE, (ENSURE ENLIVE) LIQD Take 237 mLs by mouth 2 (two) times daily between meals. 237 mL 12  . lisinopril (PRINIVIL,ZESTRIL) 5 MG tablet Take 7.5 mg by mouth daily.    . traMADol (ULTRAM) 50 MG tablet Take 50 mg by mouth every 6 (six) hours as needed for pain.  1   No current facility-administered medications for this visit.     Allergies:   Patient has no known allergies.    Social History:  The patient  reports that she quit smoking about 41 years ago. Her smoking use included cigarettes. She has never used smokeless tobacco. She reports that she does not drink alcohol or use drugs.   Family History:  The patient's family history includes Heart disease in her brother and father.    ROS:  Please see the history of present illness.   Otherwise, review of systems are positive for .  All other systems are reviewed and negative.    PHYSICAL EXAM: VS:  BP 120/76   Pulse 63   Ht  (1.499 m)   Wt 138 lb (62.6 kg)   SpO2 99%   BMI 27.87 kg/m  , BMI Body mass index is 27.87 kg/m. GEN: Well nourished, well developed, in no acute distress  HEENT: normal  Neck: no JVD, carotid bruits, or masses Cardiac: RRR; no murmurs, rubs, or gallops,; 1+ pretibial  edema  Respiratory:  clear to auscultation bilaterally, normal work of breathing GI: soft, nontender, nondistended, + BS MS: no deformity or atrophy  Skin: warm and dry, no rash Neuro:  Strength and sensation are intact Psych:  euthymic mood, full affect     Recent Labs: 09/15/2017: Magnesium 2.3 11/23/2017: ALT 17; Hemoglobin 9.5; Platelets 386 11/26/2017: BUN 20; Creatinine, Ser 1.49; Potassium 3.7; Sodium 136   Lipid Panel No results found for: CHOL, TRIG, HDL, CHOLHDL, VLDL, LDLCALC, LDLDIRECT   Other studies Reviewed: Additional studies/ records that were reviewed today with results demonstrating: LVEF 30 to 35%.   ASSESSMENT AND PLAN:  1. Chronic systolic heart failure: No evidence of left-sided heart failure on exam.  No symptoms of volume overload.  She does not consume much salt.  I encouraged her to continue to avoid salt.  No need for diuretic at this time. 2. Elevated troponin: Was likely due to elevated blood pressure while in the hospital in the setting of chronic systolic heart failure. 3. Hypertensive heart disease: Blood pressure much better controlled.  Continue current medications.  As long as blood pressure is managed, I suspect her heart failure issues will not cause symptoms.   4. Chronic DVT: low dose Eliquis..  Managed by PMD.  No bleeding issues. 5. Dementia is her most critical issue at this time.  Family is managing.   Current medicines are reviewed at length with the patient today.  The patient concerns regarding her medicines were addressed.  The following changes have been made:  No change  Labs/ tests ordered today include:  No orders of the defined types were placed in this encounter.   Recommend 150 minutes/week of aerobic exercise Low fat, low carb, high fiber diet recommended  Disposition:   FU as needed.   Signed, Lance Muss, MD  02/05/2018 2:49 PM    [facility] [address] [place], [place], [place]  [address] Phone: [phone]; Fax: [phone]

## 2018-04-25 ENCOUNTER — Telehealth: Payer: Self-pay

## 2018-04-25 NOTE — Telephone Encounter (Signed)
VM left for patient to schedule visit with Palliative Care.  

## 2018-04-29 ENCOUNTER — Telehealth: Payer: Self-pay

## 2018-04-29 NOTE — Telephone Encounter (Signed)
Attempted to contact patient. Phone rang with no answer

## 2018-05-01 ENCOUNTER — Telehealth: Payer: Self-pay

## 2018-05-01 NOTE — Telephone Encounter (Signed)
VM left to schedule visit with Palliative Care 

## 2018-05-23 ENCOUNTER — Telehealth: Payer: Self-pay

## 2018-05-23 NOTE — Telephone Encounter (Signed)
Received return call from patient's daughter to schedule visit with Palliative Care. [facility]

## 2018-05-28 ENCOUNTER — Other Ambulatory Visit: Payer: Medicare Other | Admitting: Internal Medicine

## 2018-05-28 DIAGNOSIS — Z515 Encounter for palliative care: Secondary | ICD-10-CM

## 2018-05-28 DIAGNOSIS — R413 Other amnesia: Secondary | ICD-10-CM

## 2018-05-28 DIAGNOSIS — G4700 Insomnia, unspecified: Secondary | ICD-10-CM

## 2018-05-28 NOTE — Progress Notes (Signed)
PALLIATIVE CARE CONSULT VISIT   PATIENT NAME: [name] [name] DOB: [birth year] MRN: [id number]  PRIMARY CARE PROVIDER:   Andi Devon, MD  REFERRING PROVIDER:  Andi Devon, MD [address] [place], [place] [address]  RESPONSIBLE PARTY:   [name] Yerkes(daughter) [phone], Claudia Haley(daughter)       RECOMMENDATIONS and PLAN:  1. Memory Loss R41.3:  FAST stage 6c at baseline. Daughters will continue to provide supportive care.  Monitor cognitive and functional abilities.  2.  Insomnia G47.00:  Day/night disorientation.  Increase daytime activities.  Consider adult daycare.  Aromatherapy at night time. Melatonin 3mg  qhs prn  3.  Palliative care encounter  Z51.5:  Reviewed Palliative vs Hospice care. Goals per daughters are to continue to provide care at home as long as possible.  Code status reviewed and DNAR was selected.  MOST form reviewed and daughters will discuss decision making.  Complete MOST on next home visit.  Palliative care will continue to follow in 1-2 months.   I spent 30 minutes providing this consultation,  from 11:00am to 11:30am at home. More than 50% of the time in this consultation was spent coordinating communication patient and 2 daughters.   HISTORY OF PRESENT ILLNESS:  [name] [name] is a 83 y.o. year old female with multiple medical problems including dementia, hx of weight loss, osteoarthritis. Daughters report that pt is very forgetful and needs cueing for activities.  Her appetite has returned to normal and she has resumed weight gain.  She requires assistance with performance of ADLs, is incontinent of urine and ambulates with assistance of cane.  She naps during the day and has more awake hours at night time.  No reported falls or aggressive behaviors.  Her daughters are her caregivers.  Palliative Care was asked to help address goals of care and advanced care planning.   CODE STATUS: DNAR  PPS: 50% HOSPICE  ELIGIBILITY/DIAGNOSIS: TBD  PAST MEDICAL HISTORY:  Past Medical History:  Diagnosis Date  . Arthritis   . Dementia   . Hypertension     SOCIAL HX:  Social History   Tobacco Use  . Smoking status: Former Smoker    Types: Cigarettes    Last attempt to quit: 09/25/1976    Years since quitting: 41.6  . Smokeless tobacco: Never Used  Substance Use Topics  . Alcohol use: No    ALLERGIES: No Known Allergies   PERTINENT MEDICATIONS:  Outpatient Encounter Medications as of 05/28/2018  Medication Sig  . acetaminophen-codeine (TYLENOL #3) 300-30 MG tablet Take 1-2 tablets by mouth every 6 (six) hours as needed for moderate pain.   Marland Kitchen amLODipine (NORVASC) 10 MG tablet Take 1 tablet (10 mg total) by mouth daily.  Marland Kitchen atorvastatin (LIPITOR) 20 MG tablet Take 20 mg by mouth daily.  . carvedilol (COREG) 3.125 MG tablet Take 1 tablet (3.125 mg total) by mouth 2 (two) times daily with a meal.  . Coenzyme Q10 (COQ10) 100 MG CAPS Take 1 tablet by mouth daily.  Marland Kitchen docusate sodium (COLACE) 100 MG capsule Take 1 capsule (100 mg total) by mouth daily.  Marland Kitchen ELIQUIS 2.5 MG TABS tablet Take 2.5 mg by mouth 2 (two) times daily.  . feeding supplement, ENSURE ENLIVE, (ENSURE ENLIVE) LIQD Take 237 mLs by mouth 2 (two) times daily between meals.  Marland Kitchen lisinopril (PRINIVIL,ZESTRIL) 5 MG tablet Take 7.5 mg by mouth daily.  . traMADol (ULTRAM) 50 MG tablet Take 50 mg by mouth every 6 (six) hours as  needed for pain.   No facility-administered encounter medications on file as of 05/28/2018.     PHYSICAL EXAM:   General: NAD, well nourished appearing elderly female  Cardiovascular: regular rate and rhythm Pulmonary: clear all lung fields Abdomen: soft, nontender, + bowel sounds GU: no suprapubic tenderness Extremities: trace edema BLE.  wabbled gait Skin: exposed skin is intact Neurological: alert and oriented to self, refers to daughters as her sisters.  Cognitive decline noted. Speaks in sentences but subject matter  is incorrect Psych:  Calm and cooperative  Margaretha Sheffield, NP-C

## 2018-07-02 ENCOUNTER — Telehealth: Payer: Self-pay

## 2018-07-02 DIAGNOSIS — R413 Other amnesia: Secondary | ICD-10-CM | POA: Insufficient documentation

## 2018-07-02 DIAGNOSIS — Z7189 Other specified counseling: Secondary | ICD-10-CM | POA: Insufficient documentation

## 2018-07-02 DIAGNOSIS — G4700 Insomnia, unspecified: Secondary | ICD-10-CM | POA: Insufficient documentation

## 2018-07-02 DIAGNOSIS — Z515 Encounter for palliative care: Secondary | ICD-10-CM | POA: Insufficient documentation

## 2018-07-02 NOTE — Telephone Encounter (Signed)
VM left for patient's daughter to offer to schedule a follow up visit with Palliative Care

## 2018-07-03 ENCOUNTER — Telehealth: Payer: Self-pay

## 2018-07-03 NOTE — Telephone Encounter (Signed)
Received return call from patient's daughter to schedule visit with Palliative Care. Visit scheduled for 07/04/18

## 2018-07-04 ENCOUNTER — Other Ambulatory Visit: Payer: Medicare Other | Admitting: Primary Care

## 2018-07-04 DIAGNOSIS — Z515 Encounter for palliative care: Secondary | ICD-10-CM

## 2018-07-04 DIAGNOSIS — F0391 Unspecified dementia with behavioral disturbance: Secondary | ICD-10-CM

## 2018-07-04 DIAGNOSIS — I10 Essential (primary) hypertension: Secondary | ICD-10-CM

## 2018-07-04 NOTE — Progress Notes (Signed)
PALLIATIVE CARE CONSULT VISIT   PATIENT NAME: [name] [name] DOB: 01-23-1935 MRN: [id number]  PRIMARY CARE PROVIDER:   Andi Devon, MD  REFERRING PROVIDER:  Andi Devon, MD [address] [place], [place] [address]  RESPONSIBLE PARTY:   Donyale Falcon [phone] (o) or [phone] (C) ASSESSMENT:      1. Caregiver burden: Pt is requiring more intensive care. She is able to unlock door and while she has not wandered, this is a risk. She is also not sleeping well at night and has become agitated and resistant to care, esp. In evenings.  Family divides care giving over 2 of pt's children in area (daughters). One daughter works during the week and the other is with patient during this time, then switch on weekends. This is becoming unmanageable.She has 3 sons but they are not able to provide substantial assistance.  2. Agitation, dementia progression: pt rates at 6D on FAST scale. PCG  Reports hallucinations. Her agitation is worse in the evening. She is getting out of bed by sliding to the floor in the night. They have not obtained melatonin as per previous consult.   3. Pain: Patient reports chronic  back and knee pain. No ATC analgesia noted. Has used narcotics for msk pain.  4. Goals of care planning:  Caregiver unclear as to end of life care plans. Asks for a meeting which will include sister [name]. While there are 5 children, the two daughters provide most care. There is no advance directive or hpoa. Discussed progressive nature of disease; currently pt eats 50% of meals and is ambulatory with help but her agitation and disorientation are increasing.  RECOMMENDATIONS and PLAN:  1. Provided PCG with resources to pursue e.g. PACE, private hiring. The family has a church who has offered to help with sitting with patient. Encouraged PCG to avail herself of respite.Counseled her in feelings of anxiety to leave her mom with others.  Will have our SW make telephone  contact.  2. Dementia; reiterate melatonin 3-5 mg q hs for sleep.Recommend consultation with geri- psych or neurology for dementia management if not previously done. Patient may benefit from an antipsychotic medication for hallucinations and nocturnal agitation. Seroquel 25 mg nightly with titration to effect could be of benefit.  3. Pain: Patient unable to articulate but is known to have longstanding back and osteoarthritic pain. Recommend ATC acetaminophen 500-650 bid or tid which may also  address agitation.  4. Goals of Care: Will follow up with family re having a meeting with both caregivers and reviewing respite options available in the area. SW to be involved to address qualification parameters. Will address the issue of advance planning with both present. Call made to [facility], message left.  I spent  60 minutes providing this consultation,  from 1115 to 1215. More than 50% of the time in this consultation was spent coordinating communication.   HISTORY OF PRESENT ILLNESS:  [name] [name] is a 83 y.o. year old female with multiple medical problems including dementia with behavior disturbance, spinal stenosis, osteoarthritis,chronic pain, HTN. Palliative Care was asked to help address goals of care.   CODE STATUS: DNR per previous note PPS: 40% HOSPICE ELIGIBILITY/DIAGNOSIS: TBD  PAST MEDICAL HISTORY:  Past Medical History:  Diagnosis Date  . Arthritis   . Dementia   . Hypertension     SOCIAL HX:  Social History   Tobacco Use  . Smoking status: Former Smoker    Types: Cigarettes  Last attempt to quit: 09/25/1976    Years since quitting: 41.8  . Smokeless tobacco: Never Used  Substance Use Topics  . Alcohol use: No    ALLERGIES: No Known Allergies   PERTINENT MEDICATIONS:  Outpatient Encounter Medications as of 07/04/2018  Medication Sig  . acetaminophen-codeine (TYLENOL #3) 300-30 MG tablet Take 1-2 tablets by mouth every 6 (six) hours as needed for moderate pain.    Marland Kitchen amLODipine (NORVASC) 10 MG tablet Take 1 tablet (10 mg total) by mouth daily.  Marland Kitchen atorvastatin (LIPITOR) 20 MG tablet Take 20 mg by mouth daily.  . carvedilol (COREG) 3.125 MG tablet Take 1 tablet (3.125 mg total) by mouth 2 (two) times daily with a meal.  . Coenzyme Q10 (COQ10) 100 MG CAPS Take 1 tablet by mouth daily.  Marland Kitchen docusate sodium (COLACE) 100 MG capsule Take 1 capsule (100 mg total) by mouth daily.  Marland Kitchen ELIQUIS 2.5 MG TABS tablet Take 2.5 mg by mouth 2 (two) times daily.  . feeding supplement, ENSURE ENLIVE, (ENSURE ENLIVE) LIQD Take 237 mLs by mouth 2 (two) times daily between meals.  Marland Kitchen lisinopril (PRINIVIL,ZESTRIL) 5 MG tablet Take 7.5 mg by mouth daily.  . traMADol (ULTRAM) 50 MG tablet Take 50 mg by mouth every 6 (six) hours as needed for pain.   No facility-administered encounter medications on file as of 07/04/2018.     PHYSICAL EXAM:   VS 98.1-60-18 174/94 (just took bp meds), 95% ox sat General: NAD, frail appearing, thin. Interactive but slightly agitated Cardiovascular: regular rate and rhythm, S1 S2, no gallop, murmurs Pulmonary: clear  all  Lung fields Abdomen: soft, nontender, + bowel sounds Extremities: L 1+ edema, no joint deformities Skin: no rashes, lesions Neurological: Weakness, dementia FAST 6D  Paulina Fusi, NP

## 2018-07-09 ENCOUNTER — Telehealth: Payer: Self-pay | Admitting: Primary Care

## 2018-07-09 ENCOUNTER — Telehealth: Payer: Self-pay | Admitting: Licensed Clinical Social Worker

## 2018-07-09 NOTE — Telephone Encounter (Signed)
Palliative Care SW left a vm with patient's daughter, [name], and provided names of Adult Day Care.  This was at the request of Palliative Care NP, Clearnce Sorrel.

## 2018-07-19 NOTE — Telephone Encounter (Signed)
Called MD to discuss POC.

## 2018-07-24 ENCOUNTER — Other Ambulatory Visit: Payer: Medicare Other | Admitting: Primary Care

## 2018-07-24 DIAGNOSIS — M545 Low back pain, unspecified: Secondary | ICD-10-CM

## 2018-07-24 DIAGNOSIS — F0391 Unspecified dementia with behavioral disturbance: Secondary | ICD-10-CM

## 2018-07-24 DIAGNOSIS — G8929 Other chronic pain: Secondary | ICD-10-CM

## 2018-07-24 DIAGNOSIS — Z515 Encounter for palliative care: Secondary | ICD-10-CM

## 2018-07-24 DIAGNOSIS — I10 Essential (primary) hypertension: Secondary | ICD-10-CM

## 2018-07-24 DIAGNOSIS — M1990 Unspecified osteoarthritis, unspecified site: Secondary | ICD-10-CM

## 2018-07-24 NOTE — Progress Notes (Signed)
PALLIATIVE CARE CONSULT VISIT   PATIENT NAME: [name] [name] DOB: Jun 05, 1935 MRN: [id number]  PRIMARY CARE PROVIDER:   Andi Devon, MD  REFERRING PROVIDER:  Andi Devon, MD [address] [place], [place] [address]  RESPONSIBLE PARTYAeryn, Medici Daughter [phone]     ASSESSMENT and RECOMMENDATIONS: 1. Caregiving Strain: Coping adequately at this time, resources given. Have not called any formal care providers but daughters are sharing responsibilities and friends are visiting and they feel supported.  2. Dementia: Behaviors improved slightly without recommended medications (melatonin for sleep, Seroquel 25 mg for night pacing, hallucinations). May need revisiting as dementia progresses. Patient still has outbursts which are difficult to manage, per daughter report.  3. Back Pain: Reiterated ATC acetaminophen, family  have not begun this. Also recommend combination  OTC laxatives and bowel softeners for ongoing bowel constipation and subsequent incontinence.  4. Goals of care: Continue to want to keep patient in the home, and support her as much as possible.  Continue to support family in care efforts. Return 3-4 months unless there is a change in status.  I spent 25 minutes providing this consultation,  from 13:30  to 13:55. More than 50% of the time in this consultation was spent coordinating communication.   HISTORY OF PRESENT ILLNESS:  [name] [name] is a 83 y.o. year old female with multiple medical problems including Dementia with behavioral disturbances, HTN, OA, Chronic back pain . Palliative Care was asked to help address goals of care.   CODE STATUS: DNR  PPS: 40% HOSPICE ELIGIBILITY/DIAGNOSIS: no  PAST MEDICAL HISTORY:  Past Medical History:  Diagnosis Date  . Arthritis   . Dementia   . Hypertension     SOCIAL HX:  Social History   Tobacco Use  . Smoking status: Former Smoker    Types: Cigarettes    Last attempt to quit: 09/25/1976    Years since quitting: 41.8  . Smokeless tobacco: Never Used  Substance Use Topics  . Alcohol use: No    ALLERGIES: No Known Allergies   PERTINENT MEDICATIONS:  Outpatient Encounter Medications as of 07/24/2018  Medication Sig  . acetaminophen-codeine (TYLENOL #3) 300-30 MG tablet Take 1-2 tablets by mouth every 6 (six) hours as needed for moderate pain.   Marland Kitchen amLODipine (NORVASC) 10 MG tablet Take 1 tablet (10 mg total) by mouth daily.  Marland Kitchen atorvastatin (LIPITOR) 20 MG tablet Take 20 mg by mouth daily.  . carvedilol (COREG) 3.125 MG tablet Take 1 tablet (3.125 mg total) by mouth 2 (two) times daily with a meal.  . Coenzyme Q10 (COQ10) 100 MG CAPS Take 1 tablet by mouth daily.  Marland Kitchen docusate sodium (COLACE) 100 MG capsule Take 1 capsule (100 mg total) by mouth daily.  Marland Kitchen ELIQUIS 2.5 MG TABS tablet Take 2.5 mg by mouth 2 (two) times daily.  . feeding supplement, ENSURE ENLIVE, (ENSURE ENLIVE) LIQD Take 237 mLs by mouth 2 (two) times daily between meals.  Marland Kitchen lisinopril (PRINIVIL,ZESTRIL) 5 MG tablet Take 7.5 mg by mouth daily.  . traMADol (ULTRAM) 50 MG tablet Take 50 mg by mouth every 6 (six) hours as needed for pain.   No facility-administered encounter medications on file as of 07/24/2018.     PHYSICAL EXAM:  VS 97.7  76-18 154/76 99% PO2  General: NAD, frail appearing, states she eats and sleeps well.  Cardiovascular: regular rate and rhythm, S1 S2,  Pulmonary: clear all  fields, denies ongoing cough.  Abdomen:  soft, nontender, + bowel sounds, incontinent of stool. States constipation GU: no suprapubic tenderness, incontinent  of bladder.  Extremities: no edema, no joint deformities, back pain due to arthritis. Skin: no rashes, no lesions Neurological: Weakness but otherwise nonfocal. FAST score of 6 d-e.  Marijo File DNP AGPCNP-BC

## 2018-09-26 ENCOUNTER — Other Ambulatory Visit: Payer: Medicare Other | Admitting: Primary Care

## 2018-09-26 DIAGNOSIS — F0391 Unspecified dementia with behavioral disturbance: Secondary | ICD-10-CM

## 2018-09-26 DIAGNOSIS — R413 Other amnesia: Secondary | ICD-10-CM

## 2018-09-26 DIAGNOSIS — Z515 Encounter for palliative care: Secondary | ICD-10-CM

## 2018-09-26 DIAGNOSIS — I10 Essential (primary) hypertension: Secondary | ICD-10-CM

## 2018-09-26 NOTE — Progress Notes (Signed)
[facility] Telephone: [phone] Fax: [phone]  PATIENT NAME: [name] [name] DOB: 05-31-1935 MRN: [id number]  PRIMARY CARE PROVIDER:   Andi Devon, MD  REFERRING PROVIDER:  Andi Devon, MD [address] [place], [place] [address]  RESPONSIBLE PARTY:   Extended Emergency Contact Information Primary Emergency Contact: Daw,[name] Address: [address]          [place], [place] Home Phone: [phone] Work Phone: [phone] Mobile Phone: [phone] Relation: Daughter   ASSESSMENT and RECOMMENDATIONS:   1. Caregiving Strain: Coping adequately at this time, [facility] to [facility] but patient refused to go. Daughters are sharing responsibilities and friends are visiting and they feel supported. They do endorse caregiving fatigue however.  2. Dementia:   Will start acetaminophen at HS to see if pain related. If this does not help recommend increasing mirtazipine to 15 mg q hs or re consider seroquel 12.5- 25 mg q hs. Would also recommend geriatric psychiatry referral for medication management. Night time hallucinations and wandering persist. Have tried  melatonin 10 without improvement for sleep. Began on Namaric and mirtazipine 7. 5 mg, but have not seen appreciable difference.  3. Back Pain: Recommend Acetaminophen 500 mg tid. Reiterated ATC acetaminophen for pain and perhaps improvement of mood. Family states they will try acetaminophen 500 mg tid.   4. Goals of care:  MOST form given, will discuss on next visit after family considers. They continue to want to keep patient in the home, and support her there as much as possible.  Will continue to support family in care efforts. Return 3 months unless there is a change in status. Meds reviewed. Current po Meds:   Iron 65 mg daily Namzaric 28/10 mg daily Amlodipine 10 mg daily Lisinopril 5 mg daily Mirtazipine 7.5 mg Carvedilol 6.25 mg  bid  I spent 25 minutes providing this consultation,  from 1300 to 1325. More than 50% of the time in this consultation was spent coordinating communication.   HISTORY OF PRESENT ILLNESS:  [name] [name] is a 84 y.o. year old female with multiple medical problems including dementia with behavioral disturbances, HTN, OA, Chronic back pain . Palliative Care was asked to help address goals of care.   CODE STATUS: FULL  PPS: 50%  HOSPICE ELIGIBILITY/DIAGNOSIS: TBD/dementia  PAST MEDICAL HISTORY:  Past Medical History:  Diagnosis Date  . Arthritis   . Dementia   . Hypertension     SOCIAL HX:  Social History   Tobacco Use  . Smoking status: Former Smoker    Types: Cigarettes    Last attempt to quit: 09/25/1976    Years since quitting: 42.0  . Smokeless tobacco: Never Used  Substance Use Topics  . Alcohol use: No    ALLERGIES: No Known Allergies   PERTINENT MEDICATIONS:  Outpatient Encounter Medications as of 09/26/2018  Medication Sig  . acetaminophen-codeine (TYLENOL #3) 300-30 MG tablet Take 1-2 tablets by mouth every 6 (six) hours as needed for moderate pain.   Marland Kitchen amLODipine (NORVASC) 10 MG tablet Take 1 tablet (10 mg total) by mouth daily.  Marland Kitchen atorvastatin (LIPITOR) 20 MG tablet Take 20 mg by mouth daily.  . carvedilol (COREG) 3.125 MG tablet Take 1 tablet (3.125 mg total) by mouth 2 (two) times daily with a meal.  . Coenzyme Q10 (COQ10) 100 MG CAPS Take 1 tablet by mouth daily.  Marland Kitchen docusate sodium (COLACE) 100 MG capsule Take 1 capsule (100 mg total) by mouth  daily.  Marland Kitchen ELIQUIS 2.5 MG TABS tablet Take 2.5 mg by mouth 2 (two) times daily.  . feeding supplement, ENSURE ENLIVE, (ENSURE ENLIVE) LIQD Take 237 mLs by mouth 2 (two) times daily between meals.  Marland Kitchen lisinopril (PRINIVIL,ZESTRIL) 5 MG tablet Take 7.5 mg by mouth daily.  . traMADol (ULTRAM) 50 MG tablet Take 50 mg by mouth every 6 (six) hours as needed for pain.   No facility-administered encounter medications on file as of  09/26/2018.     PHYSICAL EXAM:  VS deferred General: NAD, frail appearing, thin Cardiovascular:Denies chest pain Pulmonary: no cough Abdomen: denies constipation GU: incontinent  Extremities: no edema, no joint deformities Skin: no rashes or wounds Neurological: Weakness, memory loss, Verbal and interactive. FAST 6e  Paulina Fusi, NP

## 2018-11-23 ENCOUNTER — Emergency Department (HOSPITAL_COMMUNITY): Payer: Medicare Other

## 2018-11-23 ENCOUNTER — Emergency Department (HOSPITAL_COMMUNITY)
Admission: EM | Admit: 2018-11-23 | Discharge: 2018-11-23 | Disposition: A | Discharge status: Home / Self Care | Payer: Medicare Other | Attending: Emergency Medicine | Admitting: Emergency Medicine

## 2018-11-23 ENCOUNTER — Other Ambulatory Visit: Payer: Self-pay

## 2018-11-23 ENCOUNTER — Encounter (HOSPITAL_COMMUNITY): Payer: Self-pay

## 2018-11-23 ENCOUNTER — Emergency Department (HOSPITAL_BASED_OUTPATIENT_CLINIC_OR_DEPARTMENT_OTHER): Payer: Medicare Other

## 2018-11-23 ENCOUNTER — Ambulatory Visit (HOSPITAL_COMMUNITY)
Admission: EM | Admit: 2018-11-23 | Discharge: 2018-11-23 | Disposition: A | Discharge status: Home / Self Care | Payer: Medicare Other | Source: Home / Self Care | Attending: Family Medicine | Admitting: Family Medicine

## 2018-11-23 DIAGNOSIS — Z87891 Personal history of nicotine dependence: Secondary | ICD-10-CM | POA: Insufficient documentation

## 2018-11-23 DIAGNOSIS — Z79899 Other long term (current) drug therapy: Secondary | ICD-10-CM | POA: Insufficient documentation

## 2018-11-23 DIAGNOSIS — R2242 Localized swelling, mass and lump, left lower limb: Secondary | ICD-10-CM | POA: Insufficient documentation

## 2018-11-23 DIAGNOSIS — R1033 Periumbilical pain: Secondary | ICD-10-CM | POA: Diagnosis not present

## 2018-11-23 DIAGNOSIS — J90 Pleural effusion, not elsewhere classified: Secondary | ICD-10-CM | POA: Insufficient documentation

## 2018-11-23 DIAGNOSIS — I129 Hypertensive chronic kidney disease with stage 1 through stage 4 chronic kidney disease, or unspecified chronic kidney disease: Secondary | ICD-10-CM | POA: Insufficient documentation

## 2018-11-23 DIAGNOSIS — M546 Pain in thoracic spine: Secondary | ICD-10-CM

## 2018-11-23 DIAGNOSIS — M7989 Other specified soft tissue disorders: Secondary | ICD-10-CM

## 2018-11-23 DIAGNOSIS — M5489 Other dorsalgia: Secondary | ICD-10-CM | POA: Diagnosis present

## 2018-11-23 DIAGNOSIS — F0390 Unspecified dementia without behavioral disturbance: Secondary | ICD-10-CM | POA: Diagnosis not present

## 2018-11-23 DIAGNOSIS — N189 Chronic kidney disease, unspecified: Secondary | ICD-10-CM | POA: Insufficient documentation

## 2018-11-23 DIAGNOSIS — R1032 Left lower quadrant pain: Secondary | ICD-10-CM

## 2018-11-23 DIAGNOSIS — M545 Low back pain: Secondary | ICD-10-CM

## 2018-11-23 DIAGNOSIS — G8929 Other chronic pain: Secondary | ICD-10-CM

## 2018-11-23 LAB — URINALYSIS, ROUTINE W REFLEX MICROSCOPIC
Bilirubin Urine: NEGATIVE
Glucose, UA: NEGATIVE mg/dL
Hgb urine dipstick: NEGATIVE
Ketones, ur: NEGATIVE mg/dL
Leukocytes,Ua: NEGATIVE
Nitrite: NEGATIVE
Protein, ur: 100 mg/dL — AB
Specific Gravity, Urine: 1.009 (ref 1.005–1.030)
pH: 7 (ref 5.0–8.0)

## 2018-11-23 LAB — COMPREHENSIVE METABOLIC PANEL
ALT: 10 U/L (ref 0–44)
AST: 17 U/L (ref 15–41)
Albumin: 3.4 g/dL — ABNORMAL LOW (ref 3.5–5.0)
Alkaline Phosphatase: 99 U/L (ref 38–126)
Anion gap: 8 (ref 5–15)
BUN: 12 mg/dL (ref 8–23)
CO2: 23 mmol/L (ref 22–32)
Calcium: 9.3 mg/dL (ref 8.9–10.3)
Chloride: 107 mmol/L (ref 98–111)
Creatinine, Ser: 1.16 mg/dL — ABNORMAL HIGH (ref 0.44–1.00)
GFR calc Af Amer: 51 mL/min — ABNORMAL LOW (ref >60)
GFR calc non Af Amer: 44 mL/min — ABNORMAL LOW (ref >60)
Glucose, Bld: 100 mg/dL — ABNORMAL HIGH (ref 70–99)
Potassium: 4.1 mmol/L (ref 3.5–5.1)
Sodium: 138 mmol/L (ref 135–145)
Total Bilirubin: 0.4 mg/dL (ref 0.3–1.2)
Total Protein: 8.5 g/dL — ABNORMAL HIGH (ref 6.5–8.1)

## 2018-11-23 LAB — LIPASE, BLOOD: Lipase: 18 U/L (ref 11–51)

## 2018-11-23 LAB — CBC WITH DIFFERENTIAL/PLATELET
Abs Immature Granulocytes: 0.02 10*3/uL (ref 0.00–0.07)
Basophils Absolute: 0 10*3/uL (ref 0.0–0.1)
Basophils Relative: 0 %
Eosinophils Absolute: 0 10*3/uL (ref 0.0–0.5)
Eosinophils Relative: 0 %
HCT: 36 % (ref 36.0–46.0)
Hemoglobin: 11.3 g/dL — ABNORMAL LOW (ref 12.0–15.0)
Immature Granulocytes: 0 %
Lymphocytes Relative: 25 %
Lymphs Abs: 1.9 10*3/uL (ref 0.7–4.0)
MCH: 26.8 pg (ref 26.0–34.0)
MCHC: 31.4 g/dL (ref 30.0–36.0)
MCV: 85.5 fL (ref 80.0–100.0)
Monocytes Absolute: 0.8 10*3/uL (ref 0.1–1.0)
Monocytes Relative: 10 %
Neutro Abs: 4.9 10*3/uL (ref 1.7–7.7)
Neutrophils Relative %: 65 %
Platelets: 194 10*3/uL (ref 150–400)
RBC: 4.21 MIL/uL (ref 3.87–5.11)
RDW: 15.7 % — ABNORMAL HIGH (ref 11.5–15.5)
WBC: 7.6 10*3/uL (ref 4.0–10.5)
nRBC: 0 % (ref 0.0–0.2)

## 2018-11-23 LAB — POCT URINALYSIS DIP (DEVICE)
Bilirubin Urine: NEGATIVE
Glucose, UA: NEGATIVE mg/dL
Ketones, ur: NEGATIVE mg/dL
Nitrite: NEGATIVE
Protein, ur: >=300 mg/dL — AB
Specific Gravity, Urine: 1.02 (ref 1.005–1.030)
Urobilinogen, UA: 0.2 mg/dL (ref 0.0–1.0)
pH: 6.5 (ref 5.0–8.0)

## 2018-11-23 MED ORDER — CYCLOBENZAPRINE HCL 10 MG PO TABS: 10.0000 mg | ORAL_TABLET | Freq: Two times a day (BID) | ORAL | 0 refills | Status: DC | PRN

## 2018-11-23 MED ORDER — SODIUM CHLORIDE 0.9 % IV BOLUS
500.0000 mL | Freq: Once | INTRAVENOUS | Status: AC
  Administered 2018-11-23: 500 mL via INTRAVENOUS

## 2018-11-23 MED ORDER — FENTANYL CITRATE (PF) 100 MCG/2ML IJ SOLN: 50.0000 ug | Freq: Once | INTRAMUSCULAR | Status: DC

## 2018-11-23 MED ORDER — KETOROLAC TROMETHAMINE 15 MG/ML IJ SOLN: 15.0000 mg | Freq: Once | INTRAMUSCULAR | Status: DC

## 2018-11-23 MED ORDER — IOHEXOL 300 MG/ML  SOLN
100.0000 mL | Freq: Once | INTRAMUSCULAR | Status: AC | PRN
  Administered 2018-11-23: 100 mL via INTRAVENOUS

## 2018-11-23 MED ORDER — MORPHINE SULFATE (PF) 4 MG/ML IV SOLN
4.0000 mg | Freq: Once | INTRAVENOUS | Status: AC
  Administered 2018-11-23: 4 mg via INTRAVENOUS
  Filled 2018-11-23: qty 1

## 2018-11-23 MED ORDER — ONDANSETRON HCL 4 MG/2ML IJ SOLN
4.0000 mg | Freq: Once | INTRAMUSCULAR | Status: AC
  Administered 2018-11-23: 4 mg via INTRAVENOUS
  Filled 2018-11-23: qty 2

## 2018-11-23 NOTE — ED Triage Notes (Signed)
Pt arrives with complaints of lower back and abdominal pain, chronic but worse today. Per family, left lower leg is more swollen than normal, history of blood clot in the leg. Pt has hx of dementia, history retrieved from family. Pt in NAD during triage.

## 2018-11-23 NOTE — Progress Notes (Signed)
VASCULAR LAB PRELIMINARY  PRELIMINARY  PRELIMINARY  PRELIMINARY  Left lower extremity venous duplex completed.    Preliminary report:  See CV Proc  Called results to Sharen Heck, PA-C  [facility], [facility], RVT 11/23/2018, 4:35 PM

## 2018-11-23 NOTE — ED Triage Notes (Signed)
Pt cc back pain, abdominal pain and lower left leg swelling. X 4 days

## 2018-11-23 NOTE — ED Notes (Signed)
Pt in xray

## 2018-11-23 NOTE — Discharge Instructions (Addendum)
You were seen in the ER for back pain, abdominal pain, left leg swelling.   Work up in the ER was reassuring.  CT and chest x-ray shows small pleural effusions (fluid around bases of lungs) that may be new.  You did not have any symptoms to suggest issues with this.  Mention this to your doctor.  Monitor and return for chest pain, shortness of breath, cough, increased work of breathing.   Continue taking tylenol 3's.  I have given you a short prescription for muscle relaxer.  This can be sedating.  It can cause drowsiness and put you at risk for fall. Monitor closely.

## 2018-11-23 NOTE — ED Provider Notes (Signed)
[facility]    CSN: [id number] Arrival date & time: 11/23/18  1334     History   Chief Complaint Chief Complaint  Patient presents with  . Back Pain  . Abdominal Pain    HPI [name] [name] is a 84 y.o. female.   84 year old female with history of HTN, dementia, CHF, DVT comes in with family members for 4 day history of back pain, abdominal pain, left leg swelling. Given patient with dementia, history obtained by family members. Patient has had chronic back pain but seems to be worsening these past few days. She sometimes sleeps on the floor as she gets up at night and forgets to get back into bed. She takes tylenol #3 but does not seem to have any relief. She has also been complaining of abdominal pain, seems to be LLQ. Has a history of constipation, so family members have been giving medicine to help move her bowels. No vomiting, though patient has had less to eat recently. Unsure of urinary symptoms. Denies fever, chills, night sweats. Patient also seemed to be more confused than normal. No obvious one sided weakness, facial drooping. She has left lower leg swelling without erythema, warmth. History of DVT and has been off eliquis the past 9 months.      Past Medical History:  Diagnosis Date  . Arthritis   . Dementia (HCC)   . Hypertension     Patient Active Problem List   Diagnosis Date Noted  . Memory loss 07/02/2018  . Insomnia 07/02/2018  . Palliative care encounter 07/02/2018  . Hypertensive urgency   . Chest pain 11/23/2017  . Elevated troponin 11/23/2017  . DVT, femoral, chronic (HCC) 11/23/2017  . Acute on chronic renal insufficiency 11/23/2017  . ARF (acute renal failure) (HCC) 09/13/2017  . Dementia (HCC) 09/13/2017  . Essential hypertension 09/13/2017  . Arthritis 09/13/2017  . Chronic back pain 09/13/2017  . Confusion 06/05/2012    Past Surgical History:  Procedure Laterality Date  . ABDOMINAL HYSTERECTOMY    . BACK SURGERY      OB  History   No obstetric history on file.      Home Medications    Prior to Admission medications   Medication Sig Start Date End Date Taking? Authorizing Provider  acetaminophen-codeine (TYLENOL #3) 300-30 MG tablet Take 1-2 tablets by mouth every 6 (six) hours as needed for moderate pain.     [provider]  amLODipine (NORVASC) 10 MG tablet Take 1 tablet (10 mg total) by mouth daily. 11/28/17   Joseph Art, DO  atorvastatin (LIPITOR) 20 MG tablet Take 20 mg by mouth daily.    [provider]  carvedilol (COREG) 3.125 MG tablet Take 1 tablet (3.125 mg total) by mouth 2 (two) times daily with a meal. 11/27/17   Joseph Art, DO  Coenzyme Q10 (COQ10) 100 MG CAPS Take 1 tablet by mouth daily.    [provider]  docusate sodium (COLACE) 100 MG capsule Take 1 capsule (100 mg total) by mouth daily. 11/28/17   Joseph Art, DO  ELIQUIS 2.5 MG TABS tablet Take 2.5 mg by mouth 2 (two) times daily. 10/11/17   [provider]  feeding supplement, ENSURE ENLIVE, (ENSURE ENLIVE) LIQD Take 237 mLs by mouth 2 (two) times daily between meals. 09/15/17   Noralee Stain, DO  lisinopril (PRINIVIL,ZESTRIL) 5 MG tablet Take 7.5 mg by mouth daily.    [provider]  traMADol (ULTRAM) 50 MG  tablet Take 50 mg by mouth every 6 (six) hours as needed for pain. 10/28/17   [provider]    Family History Family History  Problem Relation Age of Onset  . Heart disease Father   . Heart disease Brother     Social History Social History   Tobacco Use  . Smoking status: Former Smoker    Types: Cigarettes    Last attempt to quit: 09/25/1976    Years since quitting: 42.1  . Smokeless tobacco: Never Used  Substance Use Topics  . Alcohol use: No  . Drug use: No     Allergies   Patient has no known allergies.   Review of Systems Review of Systems  Reason unable to perform ROS: See HPI as above.     Physical Exam Triage Vital Signs ED Triage  Vitals  Enc Vitals Group     BP 11/23/18 1349 (!) 179/87     Pulse Rate 11/23/18 1349 76     Resp 11/23/18 1349 18     Temp 11/23/18 1349 98.6 F (37 C)     Temp Source 11/23/18 1349 Oral     SpO2 11/23/18 1349 100 %     Weight 11/23/18 1350 165 lb (74.8 kg)     Height --      Head Circumference --      Peak Flow --      Pain Score 11/23/18 1350 6     Pain Loc --      Pain Edu? --      Excl. in GC? --    No data found.  Updated Vital Signs BP (!) 179/87 (BP Location: Left Arm)   Pulse 76   Temp 98.6 F (37 C) (Oral)   Resp 18   Wt 165 lb (74.8 kg)   SpO2 100%   BMI 33.33 kg/m   Physical Exam Constitutional:      General: She is not in acute distress.    Appearance: She is well-developed. She is not ill-appearing, toxic-appearing or diaphoretic.  HENT:     Head: Normocephalic and atraumatic.  Eyes:     Conjunctiva/sclera: Conjunctivae normal.     Pupils: Pupils are equal, round, and reactive to light.  Cardiovascular:     Rate and Rhythm: Normal rate and regular rhythm.     Heart sounds: Normal heart sounds. No murmur. No friction rub. No gallop.   Pulmonary:     Effort: Pulmonary effort is normal. No respiratory distress.     Breath sounds: Normal breath sounds. No stridor. No wheezing, rhonchi or rales.  Abdominal:     General: Bowel sounds are normal.     Palpations: Abdomen is soft.     Tenderness: There is no right CVA tenderness, left CVA tenderness, guarding or rebound.     Comments: LLQ tenderness without obvious guarding, rebound.   Musculoskeletal:     Comments: Exam limited due to patient's mobility. No tenderness to palpation of the back. Unable to assess ROM given patient's age and mobility.   Diffuse swelling of the left lower extremity with pitting edema to lower leg up to the knee. No erythema, warmth. No tenderness to palpation.   Skin:    General: Skin is warm and dry.  Neurological:     Mental Status: She is alert and oriented to person,  place, and time.  Psychiatric:        Behavior: Behavior normal.        Judgment: Judgment normal.  UC Treatments / Results  Labs (all labs ordered are listed, but only abnormal results are displayed) Labs Reviewed  POCT URINALYSIS DIP (DEVICE) - Abnormal; Notable for the following components:      Result Value   Hgb urine dipstick TRACE (*)    Protein, ur >=300 (*)    Leukocytes,Ua TRACE (*)    All other components within normal limits    EKG None  Radiology No results found.  Procedures Procedures (including critical care time)  Medications Ordered in UC Medications - No data to display  Initial Impression / Assessment and Plan / UC Course  I have reviewed the triage vital signs and the nursing notes.  Pertinent labs & imaging results that were available during my care of the patient were reviewed by me and considered in my medical decision making (see chart for details).    84 year old female with history of HTN, dementia, CHF, DVT comes in with family members for 4 day history of back pain, abdominal pain, left leg swelling. History vague as patient with dementia. No injury/trauma. No obvious chest pain, shortness of breath. On exam back pain was not reproducible. Mild LLQ tenderness without guarding or rebound. Left lower leg swelling with pitting edema to the knee without erythema, pain, warmth. Urine showed trace hgb, trace leukocytes. Discussed case with Dr Tracie Harrier, who suggested discharging in stable condition to the ED for further evaluation.  Final Clinical Impressions(s) / UC Diagnoses   Final diagnoses:  Left leg swelling  Chronic midline low back pain, unspecified whether sciatica present  Left lower quadrant abdominal pain   Discharge Instructions   None    ED Prescriptions    None        Belinda Fisher, PA-C 11/23/18 1441

## 2018-11-23 NOTE — ED Provider Notes (Signed)
[facility] EMERGENCY DEPARTMENT Provider Note   CSN: [id number] Arrival date & time: 11/23/18  1449    History   Chief Complaint Chief Complaint  Patient presents with  . Back Pain  . Abdominal Pain    HPI [name] [name] is a 84 y.o. female with history of dementia, hypertension, chronic back pain, systolic HF EF 30-35%, is here with family members for evaluation of worsening back pain associated with abdominal pain, left leg swelling, increased confusion, decreased appetite.  History is obtained directly from patient and family members.  Patient is oriented to self, place, event but unsure of the year.  Tells me that her back is hurting.  She points to her mid/thoracic back.  Mild.  Intermittent.  It radiates down to her lower back.  It is worse with movement.  States she has 0 pain when she is sitting still but significantly worse when she tries to sit up on the bed.  Sister who lives with her states that a few days ago patient got out of her bed in the middle of the night and instead of returning to her bed to go back to sleep she lowered herself to the floor and slept there for couple of hours so she thinks this is with causing her back pain.  Family has not witnessed any falls.  Patient typically walks around with a walker.  Has been taken Tylenol threes that she usually takes for her chronic back pain and this helps.  Additionally, sister reports decreased eating and slightly more confusion than normal.  This morning patient reported abdominal pain once but due to dementia it is unclear when this began.  Patient points to her lower abdomen.  Sister reports chronic left DVT but at baseline there is no left leg swelling, recently noticed increased left leg swelling.  She finished Eliquis 1 year ago for this.  Chronic constipation managed with OTC medications unchanged.  No recent med changes.  No associated fevers, chest pain, shortness of breath, cough, cold symptoms,  vomiting, diarrhea.  No recent prolonged travel, surgeries, estrogen use.    HPI  Past Medical History:  Diagnosis Date  . Arthritis   . Dementia (HCC)   . Hypertension     Patient Active Problem List   Diagnosis Date Noted  . Memory loss 07/02/2018  . Insomnia 07/02/2018  . Palliative care encounter 07/02/2018  . Hypertensive urgency   . Chest pain 11/23/2017  . Elevated troponin 11/23/2017  . DVT, femoral, chronic (HCC) 11/23/2017  . Acute on chronic renal insufficiency 11/23/2017  . ARF (acute renal failure) (HCC) 09/13/2017  . Dementia (HCC) 09/13/2017  . Essential hypertension 09/13/2017  . Arthritis 09/13/2017  . Chronic back pain 09/13/2017  . Confusion 06/05/2012    Past Surgical History:  Procedure Laterality Date  . ABDOMINAL HYSTERECTOMY    . BACK SURGERY       OB History   No obstetric history on file.      Home Medications    Prior to Admission medications   Medication Sig Start Date End Date Taking? Authorizing Provider  acetaminophen-codeine (TYLENOL #3) 300-30 MG tablet Take 1-2 tablets by mouth every 6 (six) hours as needed for moderate pain.     [provider]  amLODipine (NORVASC) 10 MG tablet Take 1 tablet (10 mg total) by mouth daily. 11/28/17   Joseph Art, DO  atorvastatin (LIPITOR) 20 MG tablet Take 20 mg by mouth daily.    [provider]  carvedilol (COREG) 3.125 MG tablet Take 1 tablet (3.125 mg total) by mouth 2 (two) times daily with a meal. 11/27/17   Joseph Art, DO  Coenzyme Q10 (COQ10) 100 MG CAPS Take 1 tablet by mouth daily.    [provider]  cyclobenzaprine (FLEXERIL) 10 MG tablet Take 1 tablet (10 mg total) by mouth 2 (two) times daily as needed for muscle spasms. 11/23/18   Liberty Handy, PA-C  docusate sodium (COLACE) 100 MG capsule Take 1 capsule (100 mg total) by mouth daily. 11/28/17   Joseph Art, DO  ELIQUIS 2.5 MG TABS tablet Take 2.5 mg by mouth 2 (two) times daily. 10/11/17    [provider]  feeding supplement, ENSURE ENLIVE, (ENSURE ENLIVE) LIQD Take 237 mLs by mouth 2 (two) times daily between meals. 09/15/17   Noralee Stain, DO  lisinopril (PRINIVIL,ZESTRIL) 5 MG tablet Take 7.5 mg by mouth daily.    [provider]  traMADol (ULTRAM) 50 MG tablet Take 50 mg by mouth every 6 (six) hours as needed for pain. 10/28/17   [provider]    Family History Family History  Problem Relation Age of Onset  . Heart disease Father   . Heart disease Brother     Social History Social History   Tobacco Use  . Smoking status: Former Smoker    Types: Cigarettes    Last attempt to quit: 09/25/1976    Years since quitting: 42.1  . Smokeless tobacco: Never Used  Substance Use Topics  . Alcohol use: No  . Drug use: No     Allergies   Patient has no known allergies.   Review of Systems Review of Systems  Constitutional: Positive for appetite change.  Cardiovascular: Positive for leg swelling.  Gastrointestinal: Positive for abdominal pain.  Musculoskeletal: Positive for back pain and myalgias.  Psychiatric/Behavioral: Positive for confusion.  All other systems reviewed and are negative.    Physical Exam Updated Vital Signs BP (!) 172/74 (BP Location: Right Arm)   Pulse 77   Temp 100 F (37.8 C) (Rectal)   Resp 20   Ht  (1.575 m)   Wt 74.8 kg   SpO2 100%   BMI 30.18 kg/m   Physical Exam Vitals signs and nursing note reviewed.  Constitutional:      Appearance: She is well-developed.     Comments: Non toxic, family at bedside.   HENT:     Head: Normocephalic and atraumatic.     Nose: Nose normal.     Mouth/Throat:     Comments: MMM Eyes:     Conjunctiva/sclera: Conjunctivae normal.     Pupils: Pupils are equal, round, and reactive to light.  Neck:     Musculoskeletal: Normal range of motion.  Cardiovascular:     Rate and Rhythm: Normal rate and regular rhythm.     Comments: 1+ radial and DP pulses  bilaterally. Trace/1+ edema below L knee, no calf tenderness. No edema to R leg.  No leg erythema, warmth, lesions.  Pulmonary:     Effort: Pulmonary effort is normal.     Breath sounds: Normal breath sounds.  Abdominal:     General: Bowel sounds are normal.     Palpations: Abdomen is soft.     Tenderness: There is no abdominal tenderness.     Comments: Large surgical scar in mid lower abdomen well healed.  + R CVA tenderness with percussion and palpation.  No suprapubic tenderness. No G/R/R.  Negative Murphy's and McBurney's. Active BS to lower quadrants.   Musculoskeletal: Normal range of motion.  Skin:    General: Skin is warm and dry.     Capillary Refill: Capillary refill takes less than 2 seconds.     Comments: No rash to back.  Neurological:     Mental Status: She is alert and oriented to person, place, and time.  Psychiatric:        Behavior: Behavior normal.      ED Treatments / Results  Labs (all labs ordered are listed, but only abnormal results are displayed) Labs Reviewed  URINALYSIS, ROUTINE W REFLEX MICROSCOPIC - Abnormal; Notable for the following components:      Result Value   Color, Urine STRAW (*)    Protein, ur 100 (*)    Bacteria, UA RARE (*)    All other components within normal limits  CBC WITH DIFFERENTIAL/PLATELET - Abnormal; Notable for the following components:   Hemoglobin 11.3 (*)    RDW 15.7 (*)    All other components within normal limits  COMPREHENSIVE METABOLIC PANEL - Abnormal; Notable for the following components:   Glucose, Bld 100 (*)    Creatinine, Ser 1.16 (*)    Total Protein 8.5 (*)    Albumin 3.4 (*)    GFR calc non Af Amer 44 (*)    GFR calc Af Amer 51 (*)    All other components within normal limits  URINE CULTURE  LIPASE, BLOOD    EKG None  Radiology Dg Chest 2 View  Result Date: 11/23/2018 CLINICAL DATA:  Pt arrives with complaints of lower back and abdominal pain, chronic but worse today. Per family, left lower leg  is more swollen than normal, history of blood clot in the leg. Pt has hx of dementia, history retrieved from family. EXAM: CHEST - 2 VIEW COMPARISON:  11/23/2017 FINDINGS: Heart is enlarged. There is pulmonary vascular congestion and mild interstitial edema. No focal consolidations. Small pleural effusions. There is atherosclerotic calcification of the thoracic aorta. Chronic anterior compression of numerous thoracic vertebral bodies. Associated degenerative changes. IMPRESSION: Cardiomegaly and mild edema. Aortic atherosclerosis.  (ICD10-I70.0) Electronically Signed   By: Norva Pavlov M.D.   On: 11/23/2018 18:54   Ct Abdomen Pelvis W Contrast  Result Date: 11/23/2018 CLINICAL DATA:  Back pain, abdominal pain, and left lower leg swelling for 4 days. EXAM: CT ABDOMEN AND PELVIS WITH CONTRAST TECHNIQUE: Multidetector CT imaging of the abdomen and pelvis was performed using the standard protocol following bolus administration of intravenous contrast. CONTRAST:  OMNIPAQUE IOHEXOL 300 MG/ML  SOLN COMPARISON:  09/13/2017 FINDINGS: Lower chest: Small bilateral pleural effusions, greater on the right. Atelectasis in the lung bases. Cardiac enlargement. Hepatobiliary: No focal liver abnormality is seen. No gallstones, gallbladder wall thickening, or biliary dilatation. Pancreas: Pancreas is atrophic but otherwise unremarkable. Spleen: Normal in size without focal abnormality. Adrenals/Urinary Tract: No adrenal gland nodules. Multiple small parenchymal cysts in the kidneys. Nephrograms are symmetrical. No hydronephrosis or hydroureter. Bladder is decompressed but otherwise unremarkable. Stomach/Bowel: Stomach and small bowel are decompressed. Stool throughout the colon without distention. Scattered colonic diverticula without evidence of diverticulitis. No wall thickening or inflammatory changes. Appendix is not identified. Vascular/Lymphatic: Aortic atherosclerosis. No enlarged abdominal or pelvic lymph nodes.  Reproductive: Status post hysterectomy. No adnexal masses. Other: No free air or free fluid in the abdomen. Abdominal wall musculature appears intact. Musculoskeletal: Degenerative changes throughout the spine. No destructive bone lesions. IMPRESSION: 1. Small bilateral pleural effusions,  greater on the right. Atelectasis in the lung bases. Cardiac enlargement. 2. No acute process demonstrated in the abdomen or pelvis. No evidence of bowel obstruction or inflammation. Electronically Signed   By: Burman Nieves M.D.   On: 11/23/2018 20:40   Vas [place] Lower Extremity Venous (dvt) (mc And Wl 7a-7p)  Result Date: 11/23/2018  Lower Venous Study Indications: Swelling.  Limitations: Patient's inability to keep leg turned out and edema. Comparison Study: Prior study from 10/11/2017 is available for comparison Performing Technologist: Sherren Kerns RVS  Examination Guidelines: A complete evaluation includes B-mode imaging, spectral Doppler, color Doppler, and power Doppler as needed of all accessible portions of each vessel. Bilateral testing is considered an integral part of a complete examination. Limited examinations for reoccurring indications may be performed as noted.  Right Venous Findings: +---+---------------+---------+-----------+----------+-------+    CompressibilityPhasicitySpontaneityPropertiesSummary +---+---------------+---------+-----------+----------+-------+ CFVFull           Yes      Yes                          +---+---------------+---------+-----------+----------+-------+  Left Venous Findings: +---------+---------------+---------+-----------+----------+--------------+          CompressibilityPhasicitySpontaneityPropertiesSummary        +---------+---------------+---------+-----------+----------+--------------+ CFV      Full           Yes      Yes                                 +---------+---------------+---------+-----------+----------+--------------+ SFJ      Full                                                         +---------+---------------+---------+-----------+----------+--------------+ FV Prox  Full                                                        +---------+---------------+---------+-----------+----------+--------------+ FV Mid   Full                                                        +---------+---------------+---------+-----------+----------+--------------+ FV DistalFull                                                        +---------+---------------+---------+-----------+----------+--------------+ PFV      Full                                                        +---------+---------------+---------+-----------+----------+--------------+ POP      Full           Yes      Yes                                 +---------+---------------+---------+-----------+----------+--------------+  PTV      Full                                                        +---------+---------------+---------+-----------+----------+--------------+ PERO                                                  Not visualized +---------+---------------+---------+-----------+----------+--------------+ GSV      Full                                                        +---------+---------------+---------+-----------+----------+--------------+  Left Technical Findings: Not visualized segments include peroneal vein.   Summary: Right: No evidence of common femoral vein obstruction. Left: There is no evidence of deep vein thrombosis in the lower extremity. However, portions of this examination were limited- see technologist comments above.There is no evidence of superficial venous thrombosis.  *See table(s) above for measurements and observations.    Preliminary     Procedures Procedures (including critical care time)  Medications Ordered in ED Medications  sodium chloride 0.9 % bolus 500 mL (0 mLs Intravenous Stopped 11/23/18  1942)  iohexol (OMNIPAQUE) 300 MG/ML solution 100 mL (100 mLs Intravenous Contrast Given 11/23/18 2001)  morphine 4 MG/ML injection 4 mg (4 mg Intravenous Given 11/23/18 2043)  ondansetron (ZOFRAN) injection 4 mg (4 mg Intravenous Given 11/23/18 2042)     Initial Impression / Assessment and Plan / ED Course  I have reviewed the triage vital signs and the nursing notes.  Pertinent labs & imaging results that were available during my care of the patient were reviewed by me and considered in my medical decision making (see chart for details).  Clinical Course as of Nov 23 2126  Sat Nov 23, 2018  1640 Vas US negative per tech   [CG]  1906 FINDINGS: Heart is enlarged. There is pulmonary vascular congestion and mild interstitial edema. No focal consolidations. Small pleural effusions. There is atherosclerotic calcification of the thoracic aorta. Chronic anterior compression of numerous thoracic vertebral bodies. Associated degenerative changes.  IMPRESSION: Cardiomegaly and mild edema.  Aortic atherosclerosis. (ICD10-I70.0)  DG Chest 2 View [CG]  [id number] 84 year old female with history of dementia poor historian complaining of worsening of her chronic back pain and possibly some new abdominal pain.  She is also been noted by family to have some swelling of her leg.  Prior history of DVT not on anticoagulation currently.  She is otherwise nontoxic-appearing with fairly benign exam.  Her duplex was negative and her screening labs do not show any significant abnormalities.  She is getting a CT abdomen and pelvis.  Disposition per results of testing.   [MB]    Clinical Course User Index [CG] Liberty HandyGibbons, Lysa Livengood J, PA-C [MB] Terrilee FilesButler, Michael C, MD       Difficult history due to underlying dementia but family member very helpful with ROS.  This could be acute on chronic MSK thoracic back pain. Pain is reproducible with movements, palpation, percussion.  Given age, risk, unreliable exam/history,  will  evaluate for UTI/pyelonephritis, renal stone.  She has no fever, cough, CP but this be early, R lower lobe infiltrate.  Negative murphy's and mcburney's.  She has h/o abd surgeries and SBO, constipation also on differential.  Her LLE is slightly asymmetrically edematous in setting of known chronic left femoral DVT.  She has no associated CP, sob, tachypnea, hypoxia to suggest PE and this is lower on ddx.  Will obtain labs, UA, CXR, LLE [place]. Anticipate higher level of imaging to evaluate for intraabd etiology given her risk.   2000: Re-evaluated pt. Discussed mostly benign work up results with family at bedside.  CXR discussed with family.  She has h/o HF with EF 30-35% but no clinical signs of HF exacerbation today such CP, SOB, cough, hypoxia.  She has very minimal LLE edema.  Pt has no back pain and continued to endorse periumbilical abd pain.  CTAP pending.   2126: CTAP w/ pleural effusions but no other acute intraabd/pelvic issues. TL spine w DDD. No vascular abnormalities.  Discussed with family.  They were instructed to monitor pt for signs of HF or respiratory issues.  Will dc with tylenol 3's and low dose flexeril for likely MSk back pain.  Precautions given. Discussed with EDMD.  Final Clinical Impressions(s) / ED Diagnoses   Final diagnoses:  Acute right-sided thoracic back pain  Periumbilical abdominal pain  Left leg swelling  Pleural effusion, bilateral    ED Discharge Orders         Ordered    cyclobenzaprine (FLEXERIL) 10 MG tablet  2 times daily PRN     11/23/18 2124           Jerrell Mylar 11/23/18 2128    Terrilee Files, MD 11/24/18 [phone]

## 2018-11-23 NOTE — ED Notes (Signed)
Pt to CT at this time.

## 2018-11-26 LAB — URINE CULTURE: Culture: >=100000 — AB

## 2018-11-27 ENCOUNTER — Telehealth: Payer: Self-pay | Admitting: *Deleted

## 2018-11-27 NOTE — Telephone Encounter (Signed)
Post ED Visit - Positive Culture Follow-up  Culture report reviewed by antimicrobial stewardship pharmacist: [facility] []  Enzo Bi, Pharm.D. []  Celedonio Miyamoto, Pharm.D., BCPS AQ-ID []  Garvin Fila, Pharm.D., BCPS []  Georgina Pillion, Pharm.D., BCPS []  [place], [address].D., BCPS, AAHIVP []  Estella Husk, Pharm.D., BCPS, AAHIVP []  Lysle Pearl, PharmD, BCPS []  Phillips Climes, PharmD, BCPS []  Agapito Games, PharmD, BCPS []  Verlan Friends, PharmD []  Mervyn Gay, PharmD, BCPS []  Vinnie Level, PharmD Wendelyn Breslow, PharmD  [facility] []  Len Childs, PharmD []  Greer Pickerel, PharmD []  Adalberto Cole, PharmD []  Perlie Gold, Rph []  Lonell Face) Jean Rosenthal, PharmD []  Earl Many, PharmD []  Junita Push, PharmD []  Dorna Leitz, PharmD []  Terrilee Files, PharmD []  Lynann Beaver, PharmD []  Keturah Barre, PharmD []  Loralee Pacas, PharmD []  Bernadene Person, PharmD   Positive urine culture, reviewed by Army Melia, PA UA clean and no further patient follow-up is required at this time.  Virl Axe [facility] 11/27/2018, 8:57 AM

## 2018-12-24 ENCOUNTER — Telehealth: Payer: Self-pay | Admitting: Adult Health Nurse Practitioner

## 2018-12-24 NOTE — Telephone Encounter (Signed)
Left VM with daughter to set up appointment.  Left call back info Lipa Knauff K. Garner Nash NP

## 2019-01-11 IMAGING — US US RENAL
1 series · 14 of 25 positions shown · non-contrast
Comparison: Abdominal and pelvic CT scan dated August 2027 teen

CLINICAL DATA: Acute renal insufficiency

EXAM:
RENAL / URINARY TRACT ULTRASOUND COMPLETE

[Series 1: us renal · 0.23mm/px · 14 of 38 slices shown]
[im 1/38]
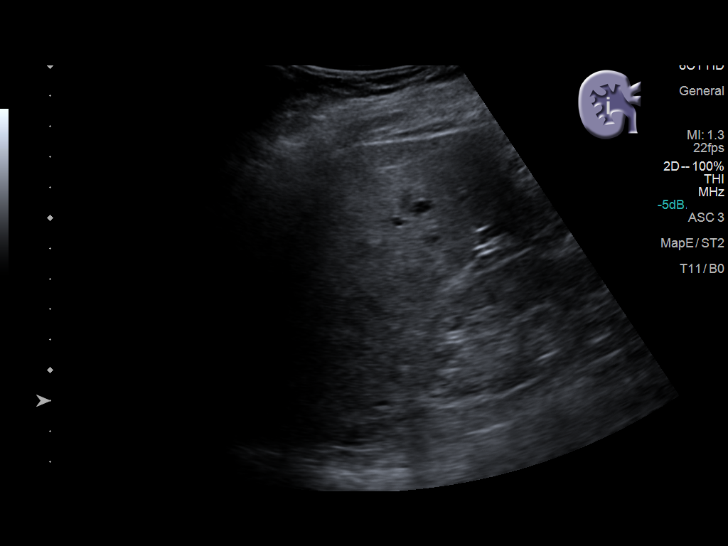
[im 4/38]
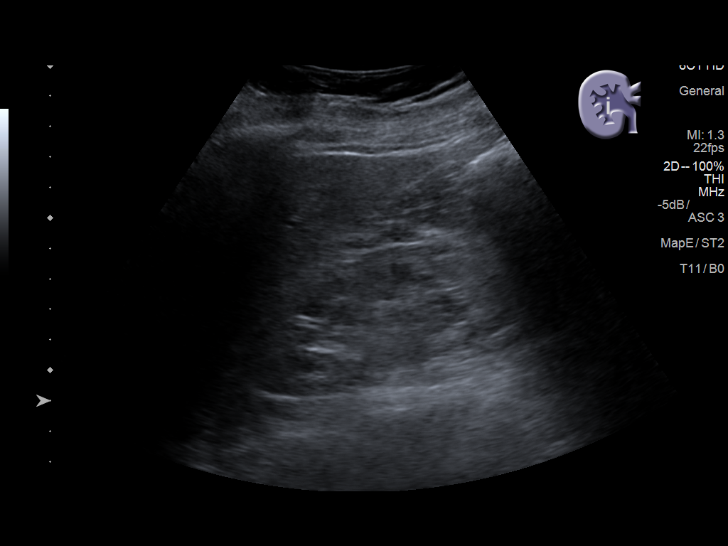
[im 7/38]
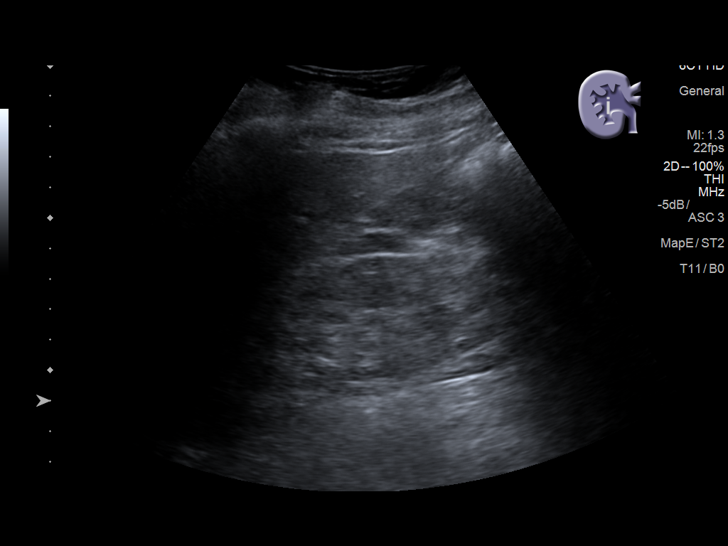
[im 10/38]
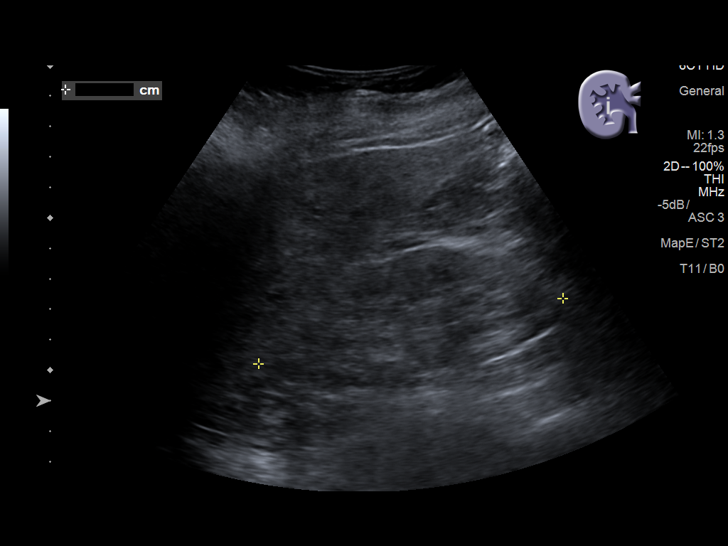
[im 13/38]
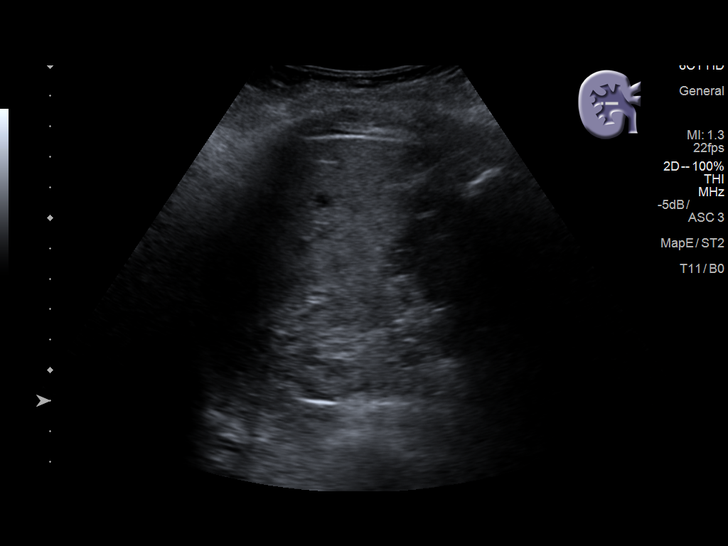
[im 14/38]
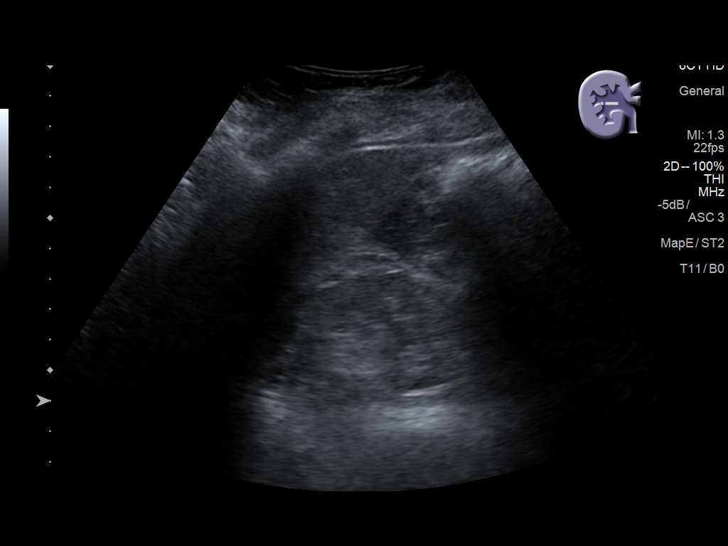
[im 17/38]
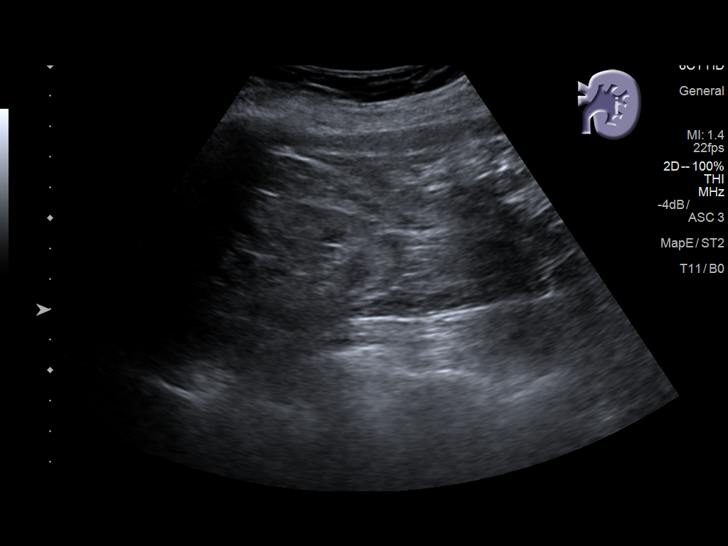
[im 21/38]
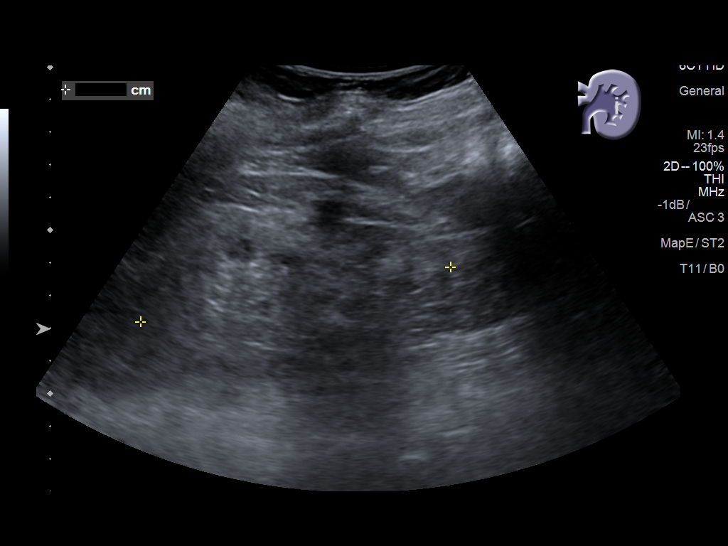
[im 24/38]
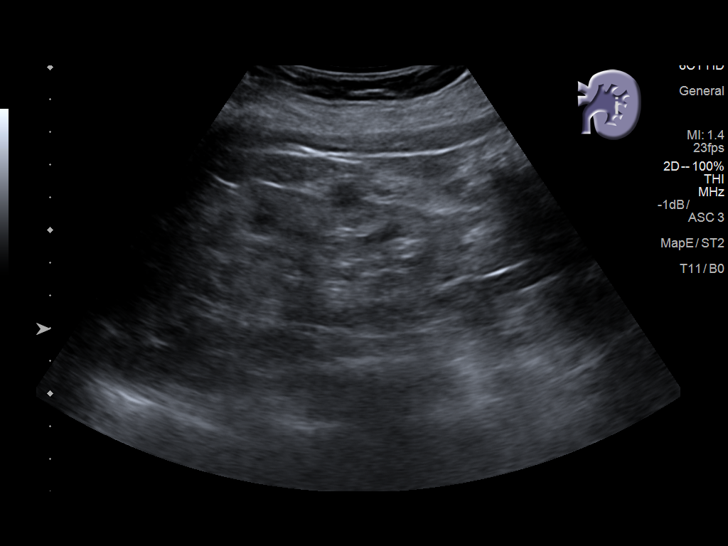
[im 25/38]
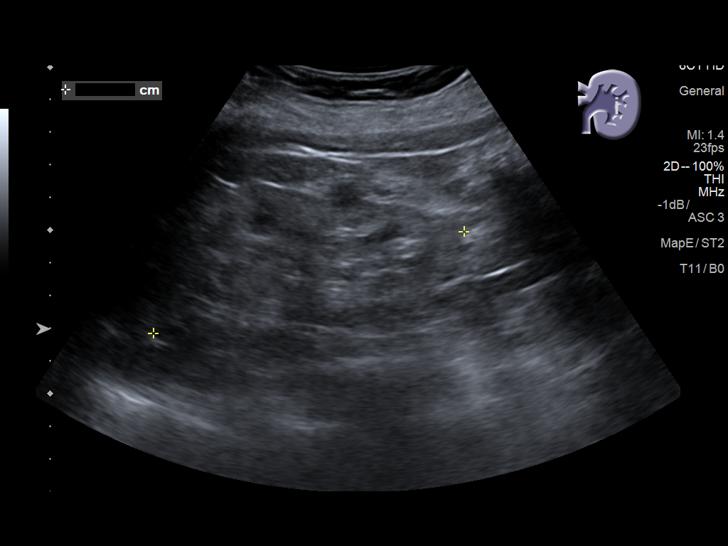
[im 28/38]
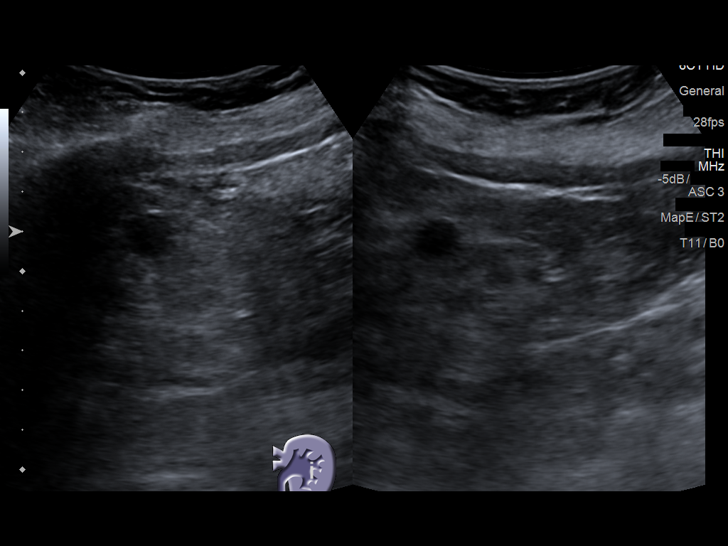
[im 31/38]
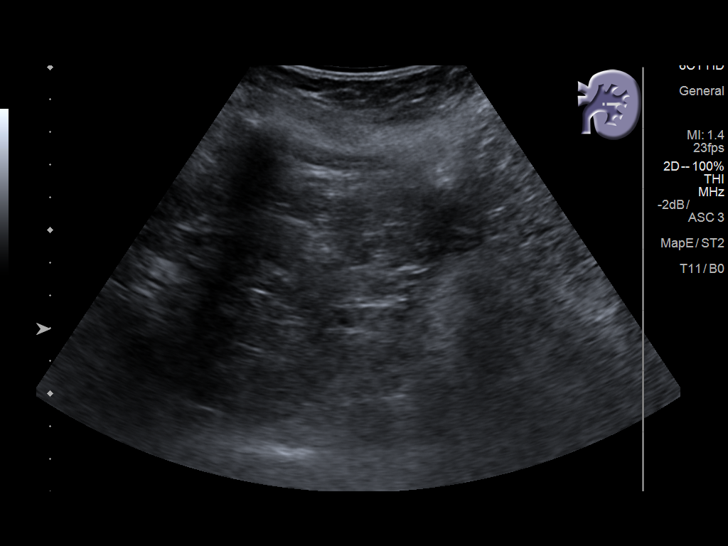
[im 34/38]
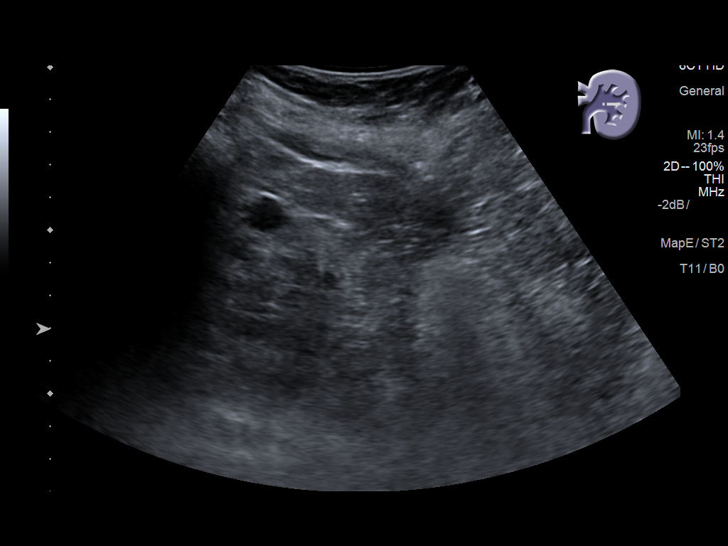
[im 38/38]
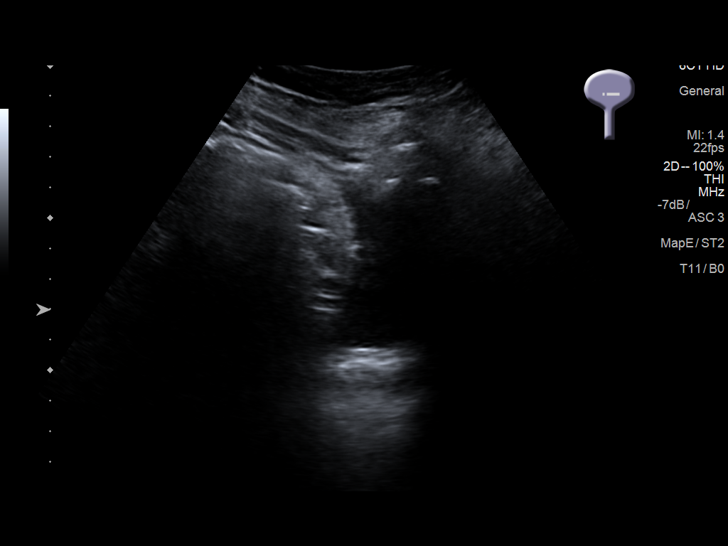

[14 of 25 positions shown; findings below may reference images not displayed]

FINDINGS: Right Kidney:

Length: 10.7 cm. The renal cortical echotexture is approximately
equal to that of the liver. There is mild cortical thinning. There
is no hydronephrosis nor cystic or solid mass.

Left Kidney:

Length: 10 cm. The renal cortical echotexture is increased similar
to that on the right. There is no hydronephrosis. There is a mid to
lower pole cortical cyst laterally measuring 1.4 cm in greatest
dimension.

Bladder:

The partially distended urinary bladder is grossly normal.
IMPRESSION: Increased renal cortical echotexture and mild cortical thinning
consistent with medical renal disease. No hydronephrosis.

Simple appearing lower pole cortical cyst on the left.

## 2019-01-15 ENCOUNTER — Telehealth: Payer: Self-pay | Admitting: Adult Health Nurse Practitioner

## 2019-01-27 ENCOUNTER — Other Ambulatory Visit: Payer: Self-pay

## 2019-01-27 ENCOUNTER — Encounter (HOSPITAL_COMMUNITY): Payer: Self-pay | Admitting: Emergency Medicine

## 2019-01-27 ENCOUNTER — Inpatient Hospital Stay (HOSPITAL_COMMUNITY)
Admission: EM | Admit: 2019-01-27 | Discharge: 2019-01-29 | DRG: 281 | Disposition: A | Discharge status: Home / Self Care | Payer: Medicare Other | Attending: Internal Medicine | Admitting: Internal Medicine

## 2019-01-27 DIAGNOSIS — I82422 Acute embolism and thrombosis of left iliac vein: Secondary | ICD-10-CM | POA: Diagnosis present

## 2019-01-27 DIAGNOSIS — R109 Unspecified abdominal pain: Secondary | ICD-10-CM | POA: Diagnosis present

## 2019-01-27 DIAGNOSIS — I82412 Acute embolism and thrombosis of left femoral vein: Secondary | ICD-10-CM | POA: Diagnosis not present

## 2019-01-27 DIAGNOSIS — Z79899 Other long term (current) drug therapy: Secondary | ICD-10-CM

## 2019-01-27 DIAGNOSIS — I129 Hypertensive chronic kidney disease with stage 1 through stage 4 chronic kidney disease, or unspecified chronic kidney disease: Secondary | ICD-10-CM | POA: Diagnosis present

## 2019-01-27 DIAGNOSIS — Z8249 Family history of ischemic heart disease and other diseases of the circulatory system: Secondary | ICD-10-CM

## 2019-01-27 DIAGNOSIS — I82402 Acute embolism and thrombosis of unspecified deep veins of left lower extremity: Secondary | ICD-10-CM

## 2019-01-27 DIAGNOSIS — R7989 Other specified abnormal findings of blood chemistry: Secondary | ICD-10-CM

## 2019-01-27 DIAGNOSIS — E785 Hyperlipidemia, unspecified: Secondary | ICD-10-CM | POA: Diagnosis present

## 2019-01-27 DIAGNOSIS — G8929 Other chronic pain: Secondary | ICD-10-CM | POA: Diagnosis present

## 2019-01-27 DIAGNOSIS — Z1159 Encounter for screening for other viral diseases: Secondary | ICD-10-CM

## 2019-01-27 DIAGNOSIS — I21A1 Myocardial infarction type 2: Secondary | ICD-10-CM | POA: Diagnosis present

## 2019-01-27 DIAGNOSIS — M1990 Unspecified osteoarthritis, unspecified site: Secondary | ICD-10-CM | POA: Diagnosis present

## 2019-01-27 DIAGNOSIS — K5900 Constipation, unspecified: Secondary | ICD-10-CM | POA: Diagnosis present

## 2019-01-27 DIAGNOSIS — E538 Deficiency of other specified B group vitamins: Secondary | ICD-10-CM | POA: Diagnosis present

## 2019-01-27 DIAGNOSIS — E860 Dehydration: Secondary | ICD-10-CM | POA: Diagnosis present

## 2019-01-27 DIAGNOSIS — I82432 Acute embolism and thrombosis of left popliteal vein: Secondary | ICD-10-CM | POA: Diagnosis present

## 2019-01-27 DIAGNOSIS — D649 Anemia, unspecified: Secondary | ICD-10-CM | POA: Diagnosis present

## 2019-01-27 DIAGNOSIS — Z87891 Personal history of nicotine dependence: Secondary | ICD-10-CM

## 2019-01-27 DIAGNOSIS — Z90710 Acquired absence of both cervix and uterus: Secondary | ICD-10-CM

## 2019-01-27 DIAGNOSIS — N179 Acute kidney failure, unspecified: Secondary | ICD-10-CM

## 2019-01-27 DIAGNOSIS — N183 Chronic kidney disease, stage 3 (moderate): Secondary | ICD-10-CM | POA: Diagnosis present

## 2019-01-27 DIAGNOSIS — F0390 Unspecified dementia without behavioral disturbance: Secondary | ICD-10-CM | POA: Diagnosis present

## 2019-01-27 DIAGNOSIS — Z86718 Personal history of other venous thrombosis and embolism: Secondary | ICD-10-CM

## 2019-01-27 DIAGNOSIS — R778 Other specified abnormalities of plasma proteins: Secondary | ICD-10-CM

## 2019-01-27 DIAGNOSIS — I82409 Acute embolism and thrombosis of unspecified deep veins of unspecified lower extremity: Secondary | ICD-10-CM | POA: Diagnosis present

## 2019-01-27 NOTE — ED Triage Notes (Signed)
Pt here with daughter, daughter reports swelling to left leg since yesterday, denies injury or redness. Reports decreased urination x 2 days. Pt has alzheimers.

## 2019-01-27 NOTE — ED Notes (Signed)
Dopplered pedal pulses bilaterally.

## 2019-01-28 ENCOUNTER — Emergency Department (HOSPITAL_COMMUNITY): Payer: Medicare Other

## 2019-01-28 ENCOUNTER — Other Ambulatory Visit: Payer: Self-pay

## 2019-01-28 ENCOUNTER — Observation Stay (HOSPITAL_COMMUNITY): Payer: Medicare Other

## 2019-01-28 ENCOUNTER — Observation Stay (HOSPITAL_BASED_OUTPATIENT_CLINIC_OR_DEPARTMENT_OTHER): Payer: Medicare Other

## 2019-01-28 ENCOUNTER — Encounter (HOSPITAL_COMMUNITY): Payer: Self-pay | Admitting: *Deleted

## 2019-01-28 DIAGNOSIS — Z79899 Other long term (current) drug therapy: Secondary | ICD-10-CM | POA: Diagnosis not present

## 2019-01-28 DIAGNOSIS — Z8249 Family history of ischemic heart disease and other diseases of the circulatory system: Secondary | ICD-10-CM | POA: Diagnosis not present

## 2019-01-28 DIAGNOSIS — E785 Hyperlipidemia, unspecified: Secondary | ICD-10-CM | POA: Diagnosis present

## 2019-01-28 DIAGNOSIS — I82412 Acute embolism and thrombosis of left femoral vein: Secondary | ICD-10-CM | POA: Diagnosis present

## 2019-01-28 DIAGNOSIS — N183 Chronic kidney disease, stage 3 (moderate): Secondary | ICD-10-CM | POA: Diagnosis present

## 2019-01-28 DIAGNOSIS — K5900 Constipation, unspecified: Secondary | ICD-10-CM | POA: Diagnosis present

## 2019-01-28 DIAGNOSIS — M1990 Unspecified osteoarthritis, unspecified site: Secondary | ICD-10-CM | POA: Diagnosis present

## 2019-01-28 DIAGNOSIS — I21A1 Myocardial infarction type 2: Secondary | ICD-10-CM | POA: Diagnosis present

## 2019-01-28 DIAGNOSIS — R109 Unspecified abdominal pain: Secondary | ICD-10-CM | POA: Diagnosis present

## 2019-01-28 DIAGNOSIS — Z86718 Personal history of other venous thrombosis and embolism: Secondary | ICD-10-CM | POA: Diagnosis not present

## 2019-01-28 DIAGNOSIS — I82422 Acute embolism and thrombosis of left iliac vein: Secondary | ICD-10-CM | POA: Diagnosis present

## 2019-01-28 DIAGNOSIS — E538 Deficiency of other specified B group vitamins: Secondary | ICD-10-CM | POA: Diagnosis present

## 2019-01-28 DIAGNOSIS — E860 Dehydration: Secondary | ICD-10-CM | POA: Diagnosis present

## 2019-01-28 DIAGNOSIS — M7989 Other specified soft tissue disorders: Secondary | ICD-10-CM

## 2019-01-28 DIAGNOSIS — I82402 Acute embolism and thrombosis of unspecified deep veins of left lower extremity: Secondary | ICD-10-CM | POA: Diagnosis not present

## 2019-01-28 DIAGNOSIS — N179 Acute kidney failure, unspecified: Secondary | ICD-10-CM | POA: Diagnosis present

## 2019-01-28 DIAGNOSIS — I82432 Acute embolism and thrombosis of left popliteal vein: Secondary | ICD-10-CM | POA: Diagnosis present

## 2019-01-28 DIAGNOSIS — I129 Hypertensive chronic kidney disease with stage 1 through stage 4 chronic kidney disease, or unspecified chronic kidney disease: Secondary | ICD-10-CM | POA: Diagnosis present

## 2019-01-28 DIAGNOSIS — Z1159 Encounter for screening for other viral diseases: Secondary | ICD-10-CM | POA: Diagnosis not present

## 2019-01-28 DIAGNOSIS — F0390 Unspecified dementia without behavioral disturbance: Secondary | ICD-10-CM | POA: Diagnosis present

## 2019-01-28 DIAGNOSIS — D649 Anemia, unspecified: Secondary | ICD-10-CM | POA: Diagnosis present

## 2019-01-28 DIAGNOSIS — Z87891 Personal history of nicotine dependence: Secondary | ICD-10-CM | POA: Diagnosis not present

## 2019-01-28 DIAGNOSIS — I824Z2 Acute embolism and thrombosis of unspecified deep veins of left distal lower extremity: Secondary | ICD-10-CM | POA: Diagnosis not present

## 2019-01-28 DIAGNOSIS — Z90710 Acquired absence of both cervix and uterus: Secondary | ICD-10-CM | POA: Diagnosis not present

## 2019-01-28 DIAGNOSIS — I82409 Acute embolism and thrombosis of unspecified deep veins of unspecified lower extremity: Secondary | ICD-10-CM | POA: Diagnosis present

## 2019-01-28 DIAGNOSIS — R7989 Other specified abnormal findings of blood chemistry: Secondary | ICD-10-CM | POA: Diagnosis not present

## 2019-01-28 DIAGNOSIS — G8929 Other chronic pain: Secondary | ICD-10-CM | POA: Diagnosis present

## 2019-01-28 LAB — CBC WITH DIFFERENTIAL/PLATELET
Abs Immature Granulocytes: 0.02 10*3/uL (ref 0.00–0.07)
Basophils Absolute: 0 10*3/uL (ref 0.0–0.1)
Basophils Relative: 0 %
Eosinophils Absolute: 0.1 10*3/uL (ref 0.0–0.5)
Eosinophils Relative: 1 %
HCT: 34.7 % — ABNORMAL LOW (ref 36.0–46.0)
Hemoglobin: 10.6 g/dL — ABNORMAL LOW (ref 12.0–15.0)
Immature Granulocytes: 0 %
Lymphocytes Relative: 19 %
Lymphs Abs: 1.8 10*3/uL (ref 0.7–4.0)
MCH: 25.8 pg — ABNORMAL LOW (ref 26.0–34.0)
MCHC: 30.5 g/dL (ref 30.0–36.0)
MCV: 84.4 fL (ref 80.0–100.0)
Monocytes Absolute: 0.9 10*3/uL (ref 0.1–1.0)
Monocytes Relative: 10 %
Neutro Abs: 6.4 10*3/uL (ref 1.7–7.7)
Neutrophils Relative %: 70 %
Platelets: 219 10*3/uL (ref 150–400)
RBC: 4.11 MIL/uL (ref 3.87–5.11)
RDW: 16.2 % — ABNORMAL HIGH (ref 11.5–15.5)
WBC: 9.2 10*3/uL (ref 4.0–10.5)
nRBC: 0 % (ref 0.0–0.2)

## 2019-01-28 LAB — BASIC METABOLIC PANEL
Anion gap: 13 (ref 5–15)
BUN: 29 mg/dL — ABNORMAL HIGH (ref 8–23)
CO2: 21 mmol/L — ABNORMAL LOW (ref 22–32)
Calcium: 9 mg/dL (ref 8.9–10.3)
Chloride: 104 mmol/L (ref 98–111)
Creatinine, Ser: 2.05 mg/dL — ABNORMAL HIGH (ref 0.44–1.00)
GFR calc Af Amer: 26 mL/min — ABNORMAL LOW (ref >60)
GFR calc non Af Amer: 22 mL/min — ABNORMAL LOW (ref >60)
Glucose, Bld: 130 mg/dL — ABNORMAL HIGH (ref 70–99)
Potassium: 4.5 mmol/L (ref 3.5–5.1)
Sodium: 138 mmol/L (ref 135–145)

## 2019-01-28 LAB — RETICULOCYTES
Immature Retic Fract: 9.3 % (ref 2.3–15.9)
RBC.: 3.87 MIL/uL (ref 3.87–5.11)
Retic Count, Absolute: 24 10*3/uL (ref 19.0–186.0)
Retic Ct Pct: 0.6 % (ref 0.4–3.1)

## 2019-01-28 LAB — COMPREHENSIVE METABOLIC PANEL
ALT: 10 U/L (ref 0–44)
AST: 17 U/L (ref 15–41)
Albumin: 3.3 g/dL — ABNORMAL LOW (ref 3.5–5.0)
Alkaline Phosphatase: 98 U/L (ref 38–126)
Anion gap: 14 (ref 5–15)
BUN: 31 mg/dL — ABNORMAL HIGH (ref 8–23)
CO2: 19 mmol/L — ABNORMAL LOW (ref 22–32)
Calcium: 9 mg/dL (ref 8.9–10.3)
Chloride: 104 mmol/L (ref 98–111)
Creatinine, Ser: 2.25 mg/dL — ABNORMAL HIGH (ref 0.44–1.00)
GFR calc Af Amer: 23 mL/min — ABNORMAL LOW (ref >60)
GFR calc non Af Amer: 20 mL/min — ABNORMAL LOW (ref >60)
Glucose, Bld: 147 mg/dL — ABNORMAL HIGH (ref 70–99)
Potassium: 4.6 mmol/L (ref 3.5–5.1)
Sodium: 137 mmol/L (ref 135–145)
Total Bilirubin: 0.5 mg/dL (ref 0.3–1.2)
Total Protein: 8.3 g/dL — ABNORMAL HIGH (ref 6.5–8.1)

## 2019-01-28 LAB — TROPONIN I
Troponin I: 0.61 ng/mL (ref <0.03)
Troponin I: 0.64 ng/mL (ref <0.03)
Troponin I: 0.74 ng/mL (ref <0.03)

## 2019-01-28 LAB — IRON AND TIBC
Iron: 12 ug/dL — ABNORMAL LOW (ref 28–170)
Saturation Ratios: 6 % — ABNORMAL LOW (ref 10.4–31.8)
TIBC: 193 ug/dL — ABNORMAL LOW (ref 250–450)
UIBC: 181 ug/dL

## 2019-01-28 LAB — HEPARIN LEVEL (UNFRACTIONATED): Heparin Unfractionated: <0.1 IU/mL — ABNORMAL LOW (ref 0.30–0.70)

## 2019-01-28 LAB — SARS CORONAVIRUS 2 BY RT PCR (HOSPITAL ORDER, PERFORMED IN ~~LOC~~ HOSPITAL LAB): SARS Coronavirus 2: NEGATIVE

## 2019-01-28 LAB — LIPASE, BLOOD: Lipase: 21 U/L (ref 11–51)

## 2019-01-28 LAB — D-DIMER, QUANTITATIVE (NOT AT ARMC): D-Dimer, Quant: 19.73 ug/mL-FEU — ABNORMAL HIGH (ref 0.00–0.50)

## 2019-01-28 LAB — BRAIN NATRIURETIC PEPTIDE: B Natriuretic Peptide: 71.8 pg/mL (ref 0.0–100.0)

## 2019-01-28 LAB — FOLATE: Folate: 9 ng/mL (ref >5.9)

## 2019-01-28 LAB — VITAMIN B12: Vitamin B-12: 153 pg/mL — ABNORMAL LOW (ref 180–914)

## 2019-01-28 LAB — FERRITIN: Ferritin: 665 ng/mL — ABNORMAL HIGH (ref 11–307)

## 2019-01-28 MED ORDER — ATORVASTATIN CALCIUM 10 MG PO TABS
20.0000 mg | ORAL_TABLET | Freq: Every day | ORAL | Status: DC
  Administered 2019-01-28: 20 mg via ORAL
  Filled 2019-01-28: qty 2

## 2019-01-28 MED ORDER — ACETAMINOPHEN 650 MG RE SUPP: 650.0000 mg | Freq: Four times a day (QID) | RECTAL | Status: DC | PRN

## 2019-01-28 MED ORDER — CYANOCOBALAMIN 1000 MCG/ML IJ SOLN
1000.0000 ug | Freq: Once | INTRAMUSCULAR | Status: AC
  Administered 2019-01-28: 1000 ug via INTRAMUSCULAR
  Filled 2019-01-28: qty 1

## 2019-01-28 MED ORDER — HEPARIN BOLUS VIA INFUSION
4500.0000 [IU] | Freq: Once | INTRAVENOUS | Status: AC
  Administered 2019-01-28: 4500 [IU] via INTRAVENOUS
  Filled 2019-01-28: qty 4500

## 2019-01-28 MED ORDER — AMLODIPINE BESYLATE 10 MG PO TABS
10.0000 mg | ORAL_TABLET | Freq: Every day | ORAL | Status: DC
  Administered 2019-01-28 – 2019-01-29 (×2): 10 mg via ORAL
  Filled 2019-01-28 (×2): qty 1

## 2019-01-28 MED ORDER — CARVEDILOL 3.125 MG PO TABS
3.1250 mg | ORAL_TABLET | Freq: Two times a day (BID) | ORAL | Status: DC
  Administered 2019-01-28 – 2019-01-29 (×3): 3.125 mg via ORAL
  Filled 2019-01-28 (×3): qty 1

## 2019-01-28 MED ORDER — HEPARIN (PORCINE) 25000 UT/250ML-% IV SOLN
1200.0000 [IU]/h | INTRAVENOUS | Status: DC
  Administered 2019-01-28 (×2): 1200 [IU]/h via INTRAVENOUS
  Filled 2019-01-28 (×3): qty 250

## 2019-01-28 MED ORDER — SODIUM CHLORIDE 0.9 % IV BOLUS
500.0000 mL | Freq: Once | INTRAVENOUS | Status: AC
  Administered 2019-01-28: 500 mL via INTRAVENOUS

## 2019-01-28 MED ORDER — SODIUM CHLORIDE 0.9 % IV SOLN
INTRAVENOUS | Status: DC
  Administered 2019-01-28 – 2019-01-29 (×3): via INTRAVENOUS

## 2019-01-28 MED ORDER — VITAMIN B-12 1000 MCG PO TABS
1000.0000 ug | ORAL_TABLET | Freq: Every day | ORAL | Status: DC
  Administered 2019-01-28 – 2019-01-29 (×2): 1000 ug via ORAL
  Filled 2019-01-28 (×2): qty 1

## 2019-01-28 MED ORDER — ACETAMINOPHEN 325 MG PO TABS: 650.0000 mg | ORAL_TABLET | Freq: Four times a day (QID) | ORAL | Status: DC | PRN

## 2019-01-28 NOTE — ED Notes (Signed)
ED TO INPATIENT HANDOFF REPORT  ED Nurse Name and Phone #: Koleen Nimrod [phone]  S Name/Age/Gender [name] [name] 84 y.o. female Room/Bed: 034C/034C  Code Status   Code Status: Full Code  Home/SNF/Other Home Patient oriented to: self Is this baseline? Yes   Triage Complete: Triage complete  Chief Complaint leg swelling  Triage Note Pt here with daughter, daughter reports swelling to left leg since yesterday, denies injury or redness. Reports decreased urination x 2 days. Pt has alzheimers.    Allergies No Known Allergies  Level of Care/Admitting Diagnosis ED Disposition    ED Disposition Condition Comment   Admit  Hospital Area: [facility] [[id number]]  Level of Care: Telemetry Medical [104]  I expect the patient will be discharged within 24 hours: No (not a candidate for 5C-Observation unit)  Covid Evaluation: N/A  Diagnosis: DVT (deep venous thrombosis) [facility]) [[id number]]  Admitting Physician: John Giovanni [[id number]]  Attending Physician: John Giovanni [[id number]]  PT Class (Do Not Modify): Observation [104]  PT Acc Code (Do Not Modify): Observation [10022]       B Medical/Surgery History Past Medical History:  Diagnosis Date  . Arthritis   . Dementia (HCC)   . Hypertension    Past Surgical History:  Procedure Laterality Date  . ABDOMINAL HYSTERECTOMY    . BACK SURGERY       A IV Location/Drains/Wounds Patient Lines/Drains/Airways Status   Active Line/Drains/Airways    Name:   Placement date:   Placement time:   Site:   Days:   Peripheral IV 01/28/19 Left Antecubital   01/28/19    0035    Antecubital   less than 1   External Urinary Catheter   11/23/17    1320    -   431          Intake/Output Last 24 hours No intake or output data in the 24 hours ending 01/28/19 0400  Labs/Imaging Results for orders placed or performed during the hospital encounter of 01/27/19 (from the past 48 hour(s))  CBC with Differential/Platelet      Status: Abnormal   Collection Time: 01/28/19 12:37 AM  Result Value Ref Range   WBC 9.2 4.0 - 10.5 K/uL   RBC 4.11 3.87 - 5.11 MIL/uL   Hemoglobin 10.6 (L) 12.0 - 15.0 g/dL   HCT 72.5 (L) 36.6 - 44.0 %   MCV 84.4 80.0 - 100.0 fL   MCH 25.8 (L) 26.0 - 34.0 pg   MCHC 30.5 30.0 - 36.0 g/dL   RDW 34.7 (H) 42.5 - 95.6 %   Platelets 219 150 - 400 K/uL   nRBC 0.0 0.0 - 0.2 %   Neutrophils Relative % 70 %   Neutro Abs 6.4 1.7 - 7.7 K/uL   Lymphocytes Relative 19 %   Lymphs Abs 1.8 0.7 - 4.0 K/uL   Monocytes Relative 10 %   Monocytes Absolute 0.9 0.1 - 1.0 K/uL   Eosinophils Relative 1 %   Eosinophils Absolute 0.1 0.0 - 0.5 K/uL   Basophils Relative 0 %   Basophils Absolute 0.0 0.0 - 0.1 K/uL   Immature Granulocytes 0 %   Abs Immature Granulocytes 0.02 0.00 - 0.07 K/uL    Comment: Performed at [facility], [address], [place], [place] [address]  Comprehensive metabolic panel     Status: Abnormal   Collection Time: 01/28/19 12:37 AM  Result Value Ref Range   Sodium 137 135 - 145 mmol/L   Potassium 4.6 3.5 -  5.1 mmol/L   Chloride 104 98 - 111 mmol/L   CO2 19 (L) 22 - 32 mmol/L   Glucose, Bld 147 (H) 70 - 99 mg/dL   BUN 31 (H) 8 - 23 mg/dL   Creatinine, Ser 1.61 (H) 0.44 - 1.00 mg/dL   Calcium 9.0 8.9 - 09.6 mg/dL   Total Protein 8.3 (H) 6.5 - 8.1 g/dL   Albumin 3.3 (L) 3.5 - 5.0 g/dL   AST 17 15 - 41 U/L   ALT 10 0 - 44 U/L   Alkaline Phosphatase 98 38 - 126 U/L   Total Bilirubin 0.5 0.3 - 1.2 mg/dL   GFR calc non Af Amer 20 (L) >60 mL/min   GFR calc Af Amer 23 (L) >60 mL/min   Anion gap 14 5 - 15    Comment: Performed at [facility], [address], [place], [place] [address]  Brain natriuretic peptide     Status: None   Collection Time: 01/28/19 12:37 AM  Result Value Ref Range   B Natriuretic Peptide 71.8 0.0 - 100.0 pg/mL    Comment: Performed at [facility], [address], [place], [place] [address]  Troponin I - ONCE - STAT     Status:  Abnormal   Collection Time: 01/28/19 12:37 AM  Result Value Ref Range   Troponin I 0.61 (HH) <0.03 ng/mL    Comment: CRITICAL RESULT CALLED TO, READ BACK BY AND VERIFIED WITH: Taseen Marasigan M,RN 01/28/19 0133 WAYK Performed at [facility], [address], [place], [place] [address]   D-dimer, quantitative (not at [facility])     Status: Abnormal   Collection Time: 01/28/19 12:37 AM  Result Value Ref Range   D-Dimer, Quant 19.73 (H) 0.00 - 0.50 ug/mL-FEU    Comment: (NOTE) At the manufacturer cut-off of 0.50 ug/mL FEU, this assay has been documented to exclude PE with a sensitivity and negative predictive value of 97 to 99%.  At this time, this assay has not been approved by the FDA to exclude DVT/VTE. Results should be correlated with clinical presentation. Performed at [facility], [address] [place], [place] [address]   Lipase, blood     Status: None   Collection Time: 01/28/19 12:37 AM  Result Value Ref Range   Lipase 21 11 - 51 U/L    Comment: Performed at [facility], [address] [place], [place] [address]   Ct Abdomen Pelvis Wo Contrast  Result Date: 01/28/2019 CLINICAL DATA:  Left leg swelling.  Abdominal pain EXAM: CT ABDOMEN AND PELVIS WITHOUT CONTRAST TECHNIQUE: Multidetector CT imaging of the abdomen and pelvis was performed following the standard protocol without IV contrast. COMPARISON:  11/23/2010 FINDINGS: Lower chest: Lung bases are clear. No effusions. Heart is normal size. Scattered coronary artery and aortic calcifications. Hepatobiliary: No focal hepatic abnormality. Gallbladder unremarkable. Pancreas: Pancreatic atrophy. No focal abnormality or ductal dilatation. Spleen: No focal abnormality.  Normal size. Adrenals/Urinary Tract: No adrenal abnormality. No focal renal abnormality. No stones or hydronephrosis. Urinary bladder is unremarkable. Stomach/Bowel: Normal appendix. Stomach, large and small bowel grossly unremarkable. Few scattered sigmoid  diverticula. No active diverticulitis. Vascular/Lymphatic: Aortic atherosclerosis. No enlarged abdominal or pelvic lymph nodes. The left external iliac vein and common femoral vein appear dense and distended relative to the right concerning for acute deep venous thrombosis. Reproductive: Prior hysterectomy.  No adnexal masses. Other: No free fluid or free air. There is stranding in the left pelvis surrounding  the left iliofemoral vessels, likely related to acute DVT within the left external iliac and possibly common femoral veins. Musculoskeletal: No acute bony abnormality. Degenerative changes in the lumbar spine. IMPRESSION: Dense, distended left external iliac and common femoral veins with surrounding stranding, most compatible with acute deep venous thrombosis. Aortic atherosclerosis. Electronically Signed   By: Charlett NoseKevin  Dover M.D.   On: 01/28/2019 02:13   Dg Chest Portable 1 View  Result Date: 01/28/2019 CLINICAL DATA:  Throat swelling EXAM: PORTABLE CHEST 1 VIEW COMPARISON:  11/23/2018 FINDINGS: Cardiomegaly. Low lung volumes without confluent opacity, effusion or edema. No acute bony abnormality. IMPRESSION: Cardiomegaly, low volumes.  No active disease. Electronically Signed   By: Charlett NoseKevin  Dover M.D.   On: 01/28/2019 00:45    Pending Labs Unresulted Labs (From admission, onward)    Start     Ordered   01/29/19 0500  Heparin level (unfractionated)  Daily,   R     01/28/19 0244   01/29/19 0500  CBC  Daily,   R     01/28/19 0244   01/28/19 1100  Heparin level (unfractionated)  Once-Timed,   R     01/28/19 0244   01/28/19 0700  Basic metabolic panel  Once,   R     16/06/9604/05/20 0356   01/28/19 0500  Vitamin B12  (Anemia Panel (PNL))  Tomorrow morning,   R     01/28/19 0356   01/28/19 0500  Folate  (Anemia Panel (PNL))  Tomorrow morning,   R     01/28/19 0356   01/28/19 0500  Iron and TIBC  (Anemia Panel (PNL))  Tomorrow morning,   R     01/28/19 0356   01/28/19 0500  Ferritin  (Anemia Panel (PNL))   Tomorrow morning,   R     01/28/19 0356   01/28/19 0500  Reticulocytes  (Anemia Panel (PNL))  Tomorrow morning,   R     01/28/19 0356   01/28/19 0356  Troponin I - Now Then Q6H  Now then every 6 hours,   R     01/28/19 0356   01/28/19 0355  Sodium, urine, random  Once,   R     01/28/19 0356   01/28/19 0355  Creatinine, urine, random  Once,   R     01/28/19 0356   01/28/19 0246  SARS Coronavirus 2 (CEPHEID- Performed in [facility] hospital lab), Hosp Order  (Symptomatic Patients Labs with Precautions )  Once,   R     01/28/19 0245   01/27/19 2358  Urinalysis, Routine w reflex microscopic  Once,   R     01/27/19 2357          Vitals/Pain Today's Vitals   01/27/19 1919 01/28/19 0030 01/28/19 0100 01/28/19 0323  BP:  108/75 (!) 115/94   Pulse:  64 67   Resp:  16 19   Temp:      TempSrc:      SpO2:  100% 100%   Weight:    69.4 kg  Height:    4\' 11"  (1.499 m)  PainSc: 0-No pain       Isolation Precautions No active isolations  Medications Medications  0.9 %  sodium chloride infusion ( Intravenous New Bag/Given 01/28/19 0319)  heparin bolus via infusion 4,500 Units (has no administration in time range)  heparin ADULT infusion 100 units/mL (25000 units/22950mL sodium chloride 0.45%) (has no administration in time range)  acetaminophen (TYLENOL) tablet 650 mg (has no administration in  time range)    Or  acetaminophen (TYLENOL) suppository 650 mg (has no administration in time range)  sodium chloride 0.9 % bolus 500 mL (500 mLs Intravenous New Bag/Given 01/28/19 0320)    Mobility walks with device High fall risk   Focused Assessments Pulmonary Assessment Handoff:  Lung sounds:   O2 Device: Room Air        R Recommendations: See Admitting Provider Note  Report given to:   Additional Notes: Patient from home with daughter as caregiver; Pt has dementia; Pt c/o left leg swelling and pain x 3 days; Patient ambulatory on occasion with walker per daughter; patient is very  feisty and sometimes fights you; Patient daughter is at bedside in ED and request to stay with patient-Monique,RN

## 2019-01-28 NOTE — Progress Notes (Signed)
ANTICOAGULATION CONSULT NOTE - Follow-Up Consult  Pharmacy Consult for heparin Indication: Concern for DVT/PE  No Known Allergies  Patient Measurements: Height: 4\' 11"  (149.9 cm) Weight: 153 lb (69.4 kg) IBW/kg (Calculated) : 43.2 Heparin Dosing Weight: 75kg  Vital Signs: Temp: 97.4 F (36.3 C) (05/05 1718) Temp Source: Oral (05/05 1718) BP: 131/67 (05/05 1718) Pulse Rate: 60 (05/05 1718)  Labs: Recent Labs    01/28/19 0037 01/28/19 0411 01/28/19 1002 01/28/19 1650  HGB 10.6*  --   --   --   HCT 34.7*  --   --   --   PLT 219  --   --   --   HEPARINUNFRC  --   --   --  <0.10*  CREATININE 2.25* 2.05*  --   --   TROPONINI 0.61* 0.64* 0.74*  --     Estimated Creatinine Clearance: 18.2 mL/min (A) (by C-G formula based on SCr of 2.05 mg/dL (H)).   Medical History: Past Medical History:  Diagnosis Date  . Arthritis   . Dementia (HCC)   . Hypertension    Assessment: 89 YOF presenting with leg swelling, elevated D-dimer and hx of DVT to start heparin.  No anticoagulation PTA, chronic anemia stable, plts 219.    Heparin infusion stopped overnight when IV line removed, then access was lost again this afternoon. Per RN, he is resuming now. Will not adjust heparin infusion, recheck heparin level in 8hr.  Goal of Therapy:  Heparin level 0.3-0.7 units/ml Monitor platelets by anticoagulation protocol: Yes   Plan:  Resume heparin 1200 units/h Recheck heparin level in 8hr   Fredonia Highland, PharmD, BCPS Clinical Pharmacist [phone] Please check AMION for all [facility] numbers 01/28/2019

## 2019-01-28 NOTE — ED Notes (Signed)
Admitting at bedside-Monique,RN  

## 2019-01-28 NOTE — Progress Notes (Signed)
Left lower extremity venous duplex completed. Critical results discussed with Dr. Jerral Ralph.  01/28/2019 9:53 AM Gertie Fey, MHA, RVT, RDCS, RDMS

## 2019-01-28 NOTE — ED Notes (Signed)
ED Provider at bedside. 

## 2019-01-28 NOTE — Progress Notes (Signed)
New Admission Note:  Arrival Method: Stretcher Mental Orientation: Alert to self only Telemetry: Box 1 NSR Assessment: Completed Skin: left leg swollen.   IV: NSL  Pain: Denies  Tubes: N/A Safety Measures: Safety Fall Prevention Plan initiated.  Admission: Completed 5 M  Orientation: Patient has been orientated to the room, unit and the staff. Welcome booklet given.  Family: None  Orders have been reviewed and implemented. Will continue to monitor the patient. Call light has been placed within reach and bed alarm has been activated.   Guilford Shi BSN, RN  Phone Number: [phone]   Dtr called and was wondering why she can not come up to the floor. Dtr stayed with patient the entire ER stay.  She also stated she going to call at 0700 because they told her MD makes rounds at 0700.

## 2019-01-28 NOTE — H&P (Signed)
History and Physical    [name] [name] YFV:[id number] DOB: [birth year] DOA: 01/27/2019  PCP: Andi Devon, MD Patient coming from: Home  Chief Complaint: Left lower extremity edema  HPI: [name] [name] is a 84 y.o. female with medical history significant of dementia, arthritis, hypertension presenting to the hospital for evaluation of left lower extremity edema.  History provided by patient and daughter at bedside.  Daughter states patient's entire left leg has been swollen since yesterday.  She complained of pain in this leg today.  States patient has had a blood clot in this leg for which she was on Eliquis for 6 months and finished her course about a year ago.  Currently not on anticoagulation.  Denies prior history of PE.  Daughter states patient has been complaining of intermittent abdominal pain for the past 3 months.  States she is normally constipated and is given laxatives at home.  Her last bowel movement was yesterday.  No complaints of nausea or vomiting.  No complaints of chest pain, shortness of breath, cough, fevers, or chills.  Daughter states patient does not eat much and does not consume enough fluids.  Review of Systems: As per HPI otherwise 10 point review of systems negative.  Past Medical History:  Diagnosis Date   Arthritis    Dementia (HCC)    Hypertension     Past Surgical History:  Procedure Laterality Date   ABDOMINAL HYSTERECTOMY     BACK SURGERY       reports that she quit smoking about 42 years ago. Her smoking use included cigarettes. She has never used smokeless tobacco. She reports that she does not drink alcohol or use drugs.  No Known Allergies  Family History  Problem Relation Age of Onset   Heart disease Father    Heart disease Brother     Prior to Admission medications   Medication Sig Start Date End Date Taking? Authorizing Provider  amLODipine (NORVASC) 10 MG tablet Take 1 tablet (10 mg total) by mouth daily. 11/28/17  Yes Vann,  Jessica U, DO  atorvastatin (LIPITOR) 20 MG tablet Take 20 mg by mouth daily at 6 PM.    Yes [provider]  carvedilol (COREG) 3.125 MG tablet Take 1 tablet (3.125 mg total) by mouth 2 (two) times daily with a meal. 11/27/17  Yes Vann, Jessica U, DO  Coenzyme Q10 (COQ10) 100 MG CAPS Take 1 tablet by mouth daily.   Yes [provider]  cyclobenzaprine (FLEXERIL) 10 MG tablet Take 1 tablet (10 mg total) by mouth 2 (two) times daily as needed for muscle spasms. 11/23/18  Yes Sharen Heck J, PA-C  docusate sodium (COLACE) 100 MG capsule Take 1 capsule (100 mg total) by mouth daily. 11/28/17  Yes Vann, Jessica U, DO  feeding supplement, ENSURE ENLIVE, (ENSURE ENLIVE) LIQD Take 237 mLs by mouth 2 (two) times daily between meals. 09/15/17  Yes Noralee Stain, DO  lisinopril (PRINIVIL,ZESTRIL) 5 MG tablet Take 7.5 mg by mouth daily.   Yes [provider]  traMADol (ULTRAM) 50 MG tablet Take 50 mg by mouth every 6 (six) hours as needed for pain. 10/28/17  Yes [provider]  acetaminophen-codeine (TYLENOL #3) 300-30 MG tablet Take 1-2 tablets by mouth every 6 (six) hours as needed for moderate pain.     [provider]    Physical Exam: Vitals:   01/28/19 0030 01/28/19 0100 01/28/19 0323 01/28/19 0514  BP: 108/75 (!) 115/94  (!) 163/96  Pulse: 64 67  79  Resp: Temp:    97.8 F (36.6 C)  TempSrc:    Oral  SpO2: 100% 100%  100%  Weight:   69.4 kg   Height:    (1.499 m)     Physical Exam  Constitutional: No distress.  HENT:  Head: Normocephalic.  Dry mucous membranes  Eyes: Right eye exhibits no discharge. Left eye exhibits no discharge.  Neck: Neck supple.  Cardiovascular: Normal rate, regular rhythm and intact distal pulses.  Pulmonary/Chest: Effort normal and breath sounds normal. No respiratory distress. She has no wheezes. She has no rales.  Abdominal: Soft. Bowel sounds are normal. She exhibits no distension. There is no  abdominal tenderness. There is no rebound and no guarding.  Musculoskeletal:        General: Edema present.     Comments: Left lower extremity appears edematous and warm to touch. No right lower extremity edema, increased warmth, or tenderness noted.  Neurological:  Awake and alert  Skin: Skin is warm and dry. She is not diaphoretic.     Labs on Admission: I have personally reviewed following labs and imaging studies  CBC: Recent Labs  Lab 01/28/19 0037  WBC 9.2  NEUTROABS 6.4  HGB 10.6*  HCT 34.7*  MCV 84.4  PLT 219   Basic Metabolic Panel: Recent Labs  Lab 01/28/19 0037 01/28/19 0411  NA 137 138  K 4.6 4.5  CL 104 104  CO2 19* 21*  GLUCOSE 147* 130*  BUN 31* 29*  CREATININE 2.25* 2.05*  CALCIUM 9.0 9.0   GFR: Estimated Creatinine Clearance: 18.2 mL/min (A) (by C-G formula based on SCr of 2.05 mg/dL (H)). Liver Function Tests: Recent Labs  Lab 01/28/19 0037  AST 17  ALT 10  ALKPHOS 98  BILITOT 0.5  PROT 8.3*  ALBUMIN 3.3*   Recent Labs  Lab 01/28/19 0037  LIPASE 21   No results for input(s): AMMONIA in the last 168 hours. Coagulation Profile: No results for input(s): INR, PROTIME in the last 168 hours. Cardiac Enzymes: Recent Labs  Lab 01/28/19 0037 01/28/19 0411  TROPONINI 0.61* 0.64*   BNP (last 3 results) No results for input(s): PROBNP in the last 8760 hours. HbA1C: No results for input(s): HGBA1C in the last 72 hours. CBG: No results for input(s): GLUCAP in the last 168 hours. Lipid Profile: No results for input(s): CHOL, HDL, LDLCALC, TRIG, CHOLHDL, LDLDIRECT in the last 72 hours. Thyroid Function Tests: No results for input(s): TSH, T4TOTAL, FREET4, T3FREE, THYROIDAB in the last 72 hours. Anemia Panel: Recent Labs    01/28/19 0411  VITAMINB12 153*  FOLATE 9.0  FERRITIN 665*  TIBC 193*  IRON 12*  RETICCTPCT 0.6   Urine analysis:    Component Value Date/Time   COLORURINE STRAW (A) 11/23/2018 1753   APPEARANCEUR CLEAR  11/23/2018 1753   LABSPEC 1.009 11/23/2018 1753   PHURINE 7.0 11/23/2018 1753   GLUCOSEU NEGATIVE 11/23/2018 1753   HGBUR NEGATIVE 11/23/2018 1753   BILIRUBINUR NEGATIVE 11/23/2018 1753   KETONESUR NEGATIVE 11/23/2018 1753   PROTEINUR 100 (A) 11/23/2018 1753   UROBILINOGEN 0.2 11/23/2018 1422   NITRITE NEGATIVE 11/23/2018 1753   LEUKOCYTESUR NEGATIVE 11/23/2018 1753    Radiological Exams on Admission: Ct Abdomen Pelvis Wo Contrast  Result Date: 01/28/2019 CLINICAL DATA:  Left leg swelling.  Abdominal pain EXAM: CT ABDOMEN AND PELVIS WITHOUT CONTRAST TECHNIQUE: Multidetector CT imaging of the abdomen and pelvis was performed following the standard protocol without IV  contrast. COMPARISON:  11/23/2010 FINDINGS: Lower chest: Lung bases are clear. No effusions. Heart is normal size. Scattered coronary artery and aortic calcifications. Hepatobiliary: No focal hepatic abnormality. Gallbladder unremarkable. Pancreas: Pancreatic atrophy. No focal abnormality or ductal dilatation. Spleen: No focal abnormality.  Normal size. Adrenals/Urinary Tract: No adrenal abnormality. No focal renal abnormality. No stones or hydronephrosis. Urinary bladder is unremarkable. Stomach/Bowel: Normal appendix. Stomach, large and small bowel grossly unremarkable. Few scattered sigmoid diverticula. No active diverticulitis. Vascular/Lymphatic: Aortic atherosclerosis. No enlarged abdominal or pelvic lymph nodes. The left external iliac vein and common femoral vein appear dense and distended relative to the right concerning for acute deep venous thrombosis. Reproductive: Prior hysterectomy.  No adnexal masses. Other: No free fluid or free air. There is stranding in the left pelvis surrounding the left iliofemoral vessels, likely related to acute DVT within the left external iliac and possibly common femoral veins. Musculoskeletal: No acute bony abnormality. Degenerative changes in the lumbar spine. IMPRESSION: Dense, distended left  external iliac and common femoral veins with surrounding stranding, most compatible with acute deep venous thrombosis. Aortic atherosclerosis. Electronically Signed   By: Charlett Nose M.D.   On: 01/28/2019 02:13   US Renal  Result Date: 01/28/2019 CLINICAL DATA:  Acute kidney injury EXAM: RENAL / URINARY TRACT ULTRASOUND COMPLETE COMPARISON:  Abdominal CT from earlier today FINDINGS: Right Kidney: Renal measurements: 9 x 4.4 x 5.1 cm = volume: 104 mL. Echogenic cortex diffusely. No hydronephrosis or mass. Left Kidney: Renal measurements: 8.6 x 3.9 x 3.9 cm = volume: 70 mL. Echogenic cortex diffusely. No hydronephrosis or solid mass. Incidental 15 mm simple cyst. Bladder: Appears normal for degree of bladder distention. IMPRESSION: Medical renal disease with symmetric atrophy.  No hydronephrosis. Electronically Signed   By: Marnee Spring M.D.   On: 01/28/2019 05:07   Dg Chest Portable 1 View  Result Date: 01/28/2019 CLINICAL DATA:  Throat swelling EXAM: PORTABLE CHEST 1 VIEW COMPARISON:  11/23/2018 FINDINGS: Cardiomegaly. Low lung volumes without confluent opacity, effusion or edema. No acute bony abnormality. IMPRESSION: Cardiomegaly, low volumes.  No active disease. Electronically Signed   By: Charlett Nose M.D.   On: 01/28/2019 00:45    EKG: Independently reviewed.  Sinus rhythm, no acute ST abnormality.  Assessment/Plan Principal Problem:   DVT (deep venous thrombosis) (HCC) Active Problems:   Elevated troponin   Abdominal pain   AKI (acute kidney injury) (HCC)   Chronic anemia   Left lower extremity DVT D-dimer elevated at 19.73.  CT showing dense, distended left external iliac and common femoral veins with surrounding stranding, most compatible with acute DVT.  Low concern for PE at this time given no tachycardia or hypoxia.  SPO2 99 to 100% on room air.  Patient has a history of prior DVT in the left lower extremity which was treated with anticoagulation for 6 months. -IV heparin -Left  lower extremity Doppler pending  Chronic abdominal pain Afebrile and no leukocytosis.  Lipase and LFTs normal.  Abdominal exam benign.  CT without acute abnormality. -Continue to monitor  AKI on CKD 3 BUN 31, creatinine 2.2, baseline 1.1.  Bicarb 19.  Likely prerenal due to dehydration.  Appears dry on exam. -IV fluid hydration -Continue to monitor BMP -Monitor urine output -Urine sodium and creatinine -Renal ultrasound -UA pending  Elevated troponin Troponin elevated at 0.61.  Patient is not complaining of any chest pain.  EKG without acute ischemic changes.  Troponin peaked at 0.41 on labs done a year ago.  At  that time, troponin elevation was thought to be due to hypertensive urgency and low EF of 30 to 35%.  Cardiology did not recommend ischemic work-up at that time.  Renal insufficiency could also be contributing to her chronic troponin elevation. -Cardiac monitoring -Continue to trend troponin -Echocardiogram -Consult cardiology in a.m.  Chronic normocytic anemia Hemoglobin 10.6, at baseline. -Anemia panel  Hypertension Systolic currently in the 160s. -Continue home Coreg and amlodipine  Hyperlipidemia -Continue statin  DVT prophylaxis: Heparin Code Status: Full code.  Discussed with patient and daughter at bedside. Family Communication: Daughter at bedside. Disposition Plan: Anticipate discharge after clinical improvement. Consults called: None Admission status: Observation, telemetry  This chart was dictated using voice recognition software.  Despite best efforts to proofread, errors can occur which can change the documentation meaning.  John GiovanniVasundhra Raelie Lohr MD [facility] Pager [phone]  If 7PM-7AM, please contact night-coverage [email or web address] Password [email or web address]  01/28/2019, 8:03 AM

## 2019-01-28 NOTE — ED Provider Notes (Signed)
[facility] EMERGENCY DEPARTMENT Provider Note   CSN: [id number] Arrival date & time: 01/27/19  1827    History   Chief Complaint Chief Complaint  Patient presents with  . Leg Swelling    HPI [name] [name] is a 84 y.o. female.     Level 5 caveat for dementia.  Patient here with daughter with leg swelling that she noticed over the past 2 days.  This involves the left leg only.  There is been no fall or trauma.  Daughter reports history of DVT in the past for which patient is on Eliquis for about 6 months but this was stopped about a year ago.  No current anticoagulation.  Daughter states patient is complaining of increased pain in the leg as well as pain in her abdomen.  There is been no vomiting, fever, diarrhea, constipation, pain with urination or blood in the urine.  No chest pain or shortness of breath.  She is mildly more confused than baseline.  She is stable to walk with a walker.  Denies any history of CHF.  The history is provided by the patient and a relative. The history is limited by the condition of the patient.    Past Medical History:  Diagnosis Date  . Arthritis   . Dementia (HCC)   . Hypertension     Patient Active Problem List   Diagnosis Date Noted  . Memory loss 07/02/2018  . Insomnia 07/02/2018  . Palliative care encounter 07/02/2018  . Hypertensive urgency   . Chest pain 11/23/2017  . Elevated troponin 11/23/2017  . DVT, femoral, chronic (HCC) 11/23/2017  . Acute on chronic renal insufficiency 11/23/2017  . ARF (acute renal failure) (HCC) 09/13/2017  . Dementia (HCC) 09/13/2017  . Essential hypertension 09/13/2017  . Arthritis 09/13/2017  . Chronic back pain 09/13/2017  . Confusion 06/05/2012    Past Surgical History:  Procedure Laterality Date  . ABDOMINAL HYSTERECTOMY    . BACK SURGERY       OB History   No obstetric history on file.      Home Medications    Prior to Admission medications   Medication Sig Start  Date End Date Taking? Authorizing Provider  acetaminophen-codeine (TYLENOL #3) 300-30 MG tablet Take 1-2 tablets by mouth every 6 (six) hours as needed for moderate pain.     [provider]  amLODipine (NORVASC) 10 MG tablet Take 1 tablet (10 mg total) by mouth daily. 11/28/17   Joseph Art, DO  atorvastatin (LIPITOR) 20 MG tablet Take 20 mg by mouth daily.    [provider]  carvedilol (COREG) 3.125 MG tablet Take 1 tablet (3.125 mg total) by mouth 2 (two) times daily with a meal. 11/27/17   Joseph Art, DO  Coenzyme Q10 (COQ10) 100 MG CAPS Take 1 tablet by mouth daily.    [provider]  cyclobenzaprine (FLEXERIL) 10 MG tablet Take 1 tablet (10 mg total) by mouth 2 (two) times daily as needed for muscle spasms. 11/23/18   Liberty Handy, PA-C  docusate sodium (COLACE) 100 MG capsule Take 1 capsule (100 mg total) by mouth daily. 11/28/17   Joseph Art, DO  ELIQUIS 2.5 MG TABS tablet Take 2.5 mg by mouth 2 (two) times daily. 10/11/17   [provider]  feeding supplement, ENSURE ENLIVE, (ENSURE ENLIVE) LIQD Take 237 mLs by mouth 2 (two) times daily between meals. 09/15/17   Noralee Stain, DO  lisinopril (PRINIVIL,ZESTRIL) 5 MG tablet  Take 7.5 mg by mouth daily.    [provider]  traMADol (ULTRAM) 50 MG tablet Take 50 mg by mouth every 6 (six) hours as needed for pain. 10/28/17   [provider]    Family History Family History  Problem Relation Age of Onset  . Heart disease Father   . Heart disease Brother     Social History Social History   Tobacco Use  . Smoking status: Former Smoker    Types: Cigarettes    Last attempt to quit: 09/25/1976    Years since quitting: 42.3  . Smokeless tobacco: Never Used  Substance Use Topics  . Alcohol use: No  . Drug use: No     Allergies   Patient has no known allergies.   Review of Systems Review of Systems  Unable to perform ROS: Mental status change  Constitutional:  Negative for activity change, appetite change and fever.     Physical Exam Updated Vital Signs BP (!) 121/54 (BP Location: Right Arm)   Pulse (!) 51   Temp 98.6 F (37 C) (Oral)   Resp 16   SpO2 99%   Physical Exam Vitals signs and nursing note reviewed.  Constitutional:      General: She is not in acute distress.    Appearance: She is well-developed.  HENT:     Head: Normocephalic and atraumatic.     Mouth/Throat:     Pharynx: No oropharyngeal exudate.  Eyes:     Conjunctiva/sclera: Conjunctivae normal.     Pupils: Pupils are equal, round, and reactive to light.  Neck:     Musculoskeletal: Normal range of motion and neck supple.     Comments: No meningismus. Cardiovascular:     Rate and Rhythm: Normal rate and regular rhythm.     Heart sounds: Normal heart sounds. No murmur.  Pulmonary:     Effort: Pulmonary effort is normal. No respiratory distress.     Breath sounds: Normal breath sounds.  Abdominal:     Palpations: Abdomen is soft.     Tenderness: There is abdominal tenderness. There is no guarding or rebound.     Comments: Diffuse abdominal tenderness with voluntary guarding.  Musculoskeletal: Normal range of motion.        General: Tenderness present.     Left lower leg: Edema present.     Comments: There is asymmetric nonpitting edema to the left leg.  Compartments are soft. Intact DP pulses with Doppler. Pain with range of motion of left leg. Equal femoral pulses bilaterally  Skin:    General: Skin is warm.  Neurological:     Mental Status: She is alert.     Cranial Nerves: No cranial nerve deficit.     Motor: No abnormal muscle tone.     Coordination: Coordination normal.     Comments:  5/5 strength throughout. CN 2-12 intact.Equal grip strength.  Oriented to person only.  Psychiatric:        Behavior: Behavior normal.      ED Treatments / Results  Labs (all labs ordered are listed, but only abnormal results are displayed) Labs Reviewed  CBC  WITH DIFFERENTIAL/PLATELET - Abnormal; Notable for the following components:      Result Value   Hemoglobin 10.6 (*)    HCT 34.7 (*)    MCH 25.8 (*)    RDW 16.2 (*)    All other components within normal limits  COMPREHENSIVE METABOLIC PANEL - Abnormal; Notable for the following components:  CO2 19 (*)    Glucose, Bld 147 (*)    BUN 31 (*)    Creatinine, Ser 2.25 (*)    Total Protein 8.3 (*)    Albumin 3.3 (*)    GFR calc non Af Amer 20 (*)    GFR calc Af Amer 23 (*)    All other components within normal limits  TROPONIN I - Abnormal; Notable for the following components:   Troponin I 0.61 (*)    All other components within normal limits  D-DIMER, QUANTITATIVE (NOT AT [facility]) - Abnormal; Notable for the following components:   D-Dimer, Quant 19.73 (*)    All other components within normal limits  SARS CORONAVIRUS 2 (HOSPITAL ORDER, PERFORMED IN [facility])  BRAIN NATRIURETIC PEPTIDE  LIPASE, BLOOD  URINALYSIS, ROUTINE W REFLEX MICROSCOPIC  HEPARIN LEVEL (UNFRACTIONATED)  VITAMIN B12  FOLATE  IRON AND TIBC  FERRITIN  RETICULOCYTES  SODIUM, URINE, RANDOM  CREATININE, URINE, RANDOM  BASIC METABOLIC PANEL  TROPONIN I  TROPONIN I    EKG EKG Interpretation  Date/Time:  Tuesday Jan 28 2019 00:20:48 EDT Ventricular Rate:  68 PR Interval:    QRS Duration: 95 QT Interval:  427 QTC Calculation: 455 R Axis:   2 Text Interpretation:  Sinus rhythm Consider left atrial enlargement Nonspecific repol abnormality, lateral leads Minimal ST elevation, inferior leads No significant change was found Confirmed by Glynn Octaveancour, Beaux Wedemeyer [phone]) on 01/28/2019 12:35:45 AM   Radiology Ct Abdomen Pelvis Wo Contrast  Result Date: 01/28/2019 CLINICAL DATA:  Left leg swelling.  Abdominal pain EXAM: CT ABDOMEN AND PELVIS WITHOUT CONTRAST TECHNIQUE: Multidetector CT imaging of the abdomen and pelvis was performed following the standard protocol without IV contrast. COMPARISON:  11/23/2010  FINDINGS: Lower chest: Lung bases are clear. No effusions. Heart is normal size. Scattered coronary artery and aortic calcifications. Hepatobiliary: No focal hepatic abnormality. Gallbladder unremarkable. Pancreas: Pancreatic atrophy. No focal abnormality or ductal dilatation. Spleen: No focal abnormality.  Normal size. Adrenals/Urinary Tract: No adrenal abnormality. No focal renal abnormality. No stones or hydronephrosis. Urinary bladder is unremarkable. Stomach/Bowel: Normal appendix. Stomach, large and small bowel grossly unremarkable. Few scattered sigmoid diverticula. No active diverticulitis. Vascular/Lymphatic: Aortic atherosclerosis. No enlarged abdominal or pelvic lymph nodes. The left external iliac vein and common femoral vein appear dense and distended relative to the right concerning for acute deep venous thrombosis. Reproductive: Prior hysterectomy.  No adnexal masses. Other: No free fluid or free air. There is stranding in the left pelvis surrounding the left iliofemoral vessels, likely related to acute DVT within the left external iliac and possibly common femoral veins. Musculoskeletal: No acute bony abnormality. Degenerative changes in the lumbar spine. IMPRESSION: Dense, distended left external iliac and common femoral veins with surrounding stranding, most compatible with acute deep venous thrombosis. Aortic atherosclerosis. Electronically Signed   By: Charlett NoseKevin  Dover M.D.   On: 01/28/2019 02:13   Dg Chest Portable 1 View  Result Date: 01/28/2019 CLINICAL DATA:  Throat swelling EXAM: PORTABLE CHEST 1 VIEW COMPARISON:  11/23/2018 FINDINGS: Cardiomegaly. Low lung volumes without confluent opacity, effusion or edema. No acute bony abnormality. IMPRESSION: Cardiomegaly, low volumes.  No active disease. Electronically Signed   By: Charlett NoseKevin  Dover M.D.   On: 01/28/2019 00:45    Procedures Procedures (including critical care time)  Medications Ordered in ED Medications - No data to display    Initial Impression / Assessment and Plan / ED Course  I have reviewed the triage vital signs and the nursing  notes.  Pertinent labs & imaging results that were available during my care of the patient were reviewed by me and considered in my medical decision making (see chart for details).       Leg swelling of the left leg for the past 2 days with history of DVT, no anticoagulation.  Intact distal pulses.  Patient with similar ED visit in February 2020. She had a negative left leg Doppler at that time.  Labs show AKI with elevated troponin.  Patient denies any chest pain.  Her EKG is unchanged.  Patient was admitted to the hospital in March 2019 with elevated troponins and seen by cardiology who favored hypertensive urgency and did not think patient was a candidate for invasive testing.  EF was found to be 30 to 35%.  D-dimer is elevated and there is concern for DVT in her left leg.  Ultrasound will need to be obtained in the morning.  She will also need VQ scan to rule out pulmonary embolism as elevated creatinine precludes CT scan tonight.  We will start empiric IV heparin.  Patient to be admitted for suspected large DVT in left leg as well as elevated troponin and AKI.  Will need VQ scan in the morning to rule out PE.  She is not in any respiratory distress and not hypoxic. Abdominal CT scan shows no acute pathology but does show suspected left-sided DVT in her femoral vein and iliac vein.  Admission discussed with Dr. Loney Loh.  CRITICAL CARE Performed by: Glynn Octave Total critical care time: 45 minutes Critical care time was exclusive of separately billable procedures and treating other patients. Critical care was necessary to treat or prevent imminent or life-threatening deterioration. Critical care was time spent personally by me on the following activities: development of treatment plan with patient and/or surrogate as well as nursing, discussions with consultants, evaluation  of patient's response to treatment, examination of patient, obtaining history from patient or surrogate, ordering and performing treatments and interventions, ordering and review of laboratory studies, ordering and review of radiographic studies, pulse oximetry and re-evaluation of patient's condition.  Final Clinical Impressions(s) / ED Diagnoses   Final diagnoses:  Acute thromboembolism of deep veins of left lower extremity (HCC)  AKI (acute kidney injury) (HCC)  Elevated troponin    ED Discharge Orders    None       Kinleigh Nault, Jeannett Senior, MD 01/28/19 [phone]

## 2019-01-28 NOTE — Progress Notes (Signed)
ANTICOAGULATION CONSULT NOTE - Initial Consult  Pharmacy Consult for heparin Indication: Concern for DVT/PE  No Known Allergies  Patient Measurements:   Heparin Dosing Weight: 75kg  Vital Signs: Temp: 98.6 F (37 C) (05/04 1849) Temp Source: Oral (05/04 1849) BP: 115/94 (05/05 0100) Pulse Rate: 67 (05/05 0100)  Labs: Recent Labs    01/28/19 0037  HGB 10.6*  HCT 34.7*  PLT 219  CREATININE 2.25*  TROPONINI 0.61*    CrCl cannot be calculated (Unknown ideal weight.).   Medical History: Past Medical History:  Diagnosis Date  . Arthritis   . Dementia (HCC)   . Hypertension    Assessment: 5 YOF presenting with leg swelling, elevated D-dimer and hx of DVT to start heparin.  No anticoagulation PTA, chronic anemia stable, plts 219.   Goal of Therapy:  Heparin level 0.3-0.7 units/ml Monitor platelets by anticoagulation protocol: Yes   Plan:  Heparin 4500 units IV x1, and gtt at 1200 units/hr F/u 8 hour heparin level  Daylene Posey, PharmD Clinical Pharmacist Please check AMION for all [facility] numbers 01/28/2019 2:34 AM

## 2019-01-28 NOTE — Progress Notes (Signed)
VAST RN consulted to place IV for Heparin treatment. Pt has pulled out several IVs and is extremely limited on vessel availability.  Due to dire need for Heparin drip for DVT this RN started a PIV in pt's right arm using [place]; assessed and attempted for over an hour to obtain. Advised unit RN if patient pulls this IV out VASTmight not get another IV d/t limited availability of vessels in which to place line. Unit RN wrapped arm with Kerlex and stated he would watch patient closely. This RN spoke to patient and told her how important it was to leave this line alone. She said okay.

## 2019-01-28 NOTE — Progress Notes (Signed)
Patient pulled IV out and telemetry off. Mittens applied. Paged IV for restart.

## 2019-01-28 NOTE — Progress Notes (Addendum)
Patient was seen and examined.  Her daughter was called and discussed further. 84 year old female with history of left leg DVT who was treated with Eliquis for about 6 months and he stopped about a year ago.Patient is overall poor historian.  She has history of dementia, arthritis, hypertension.  Lives with her daughter.  Stated 1 day 1 or 2 days history of noticing swelling of the left leg.  #1 acute kidney injury, baseline known creatinine of 1.19.  2.25-2.05 in the hospital.  We will continue IV fluid hydration today.  #2. extensive left lower extremity DVT with no compartment syndrome or ischemia: Continue heparin today.  I have discussed with patient as well as her daughter and they are agreeable to go back on Eliquis. Eliquis full dose per DVT treatment will be 10 mg twice a day for 7 days and 5 mg twice a day ongoing. Second episode of extensive DVT.  Does not have clinical evidence of PE.  However, will not change management by doing any VQ scan.  Will treat with Eliquis for 6 months and she needs reevaluation of continuing versus discontinuing and monitoring.  #3.  Elevated troponins: Without chest pain.  Suspect type II non-STEMI.  EKG nonischemic.  Troponin remained flat.  With AKI and presence of blood clot.  No indication for further investigations at the moment.  Patient is on heparin.  Updated patient's daughter.  If remains a stable, anticipate discharge home tomorrow on Eliquis.  Patient has chronic anemia.  B12 is low.  Will give 1 dose of B12 injection in the hospital.  Will discharge patient on oral B12 supplementation.  Patient has acute kidney injury with elevated creatinine and needs IV fluids hydration and monitoring, recheck of lab test.  Her acute kidney injury and worsening renal function is not favorable to change from IV heparin to oral anticoagulation.  Patient will need inpatient admission and monitoring, heparin infusion until renal function stabilizes.  Will change to  inpatient.

## 2019-01-28 NOTE — ED Notes (Signed)
ED RN accompanying patient to transfer to Ultrasound and will be transported to 6M after [place] is completed-Monique,RN

## 2019-01-29 DIAGNOSIS — R7989 Other specified abnormal findings of blood chemistry: Secondary | ICD-10-CM

## 2019-01-29 DIAGNOSIS — D649 Anemia, unspecified: Secondary | ICD-10-CM

## 2019-01-29 LAB — CBC WITH DIFFERENTIAL/PLATELET
Abs Immature Granulocytes: 0.01 10*3/uL (ref 0.00–0.07)
Basophils Absolute: 0 10*3/uL (ref 0.0–0.1)
Basophils Relative: 0 %
Eosinophils Absolute: 0.1 10*3/uL (ref 0.0–0.5)
Eosinophils Relative: 1 %
HCT: 32.1 % — ABNORMAL LOW (ref 36.0–46.0)
Hemoglobin: 10 g/dL — ABNORMAL LOW (ref 12.0–15.0)
Immature Granulocytes: 0 %
Lymphocytes Relative: 22 %
Lymphs Abs: 1.5 10*3/uL (ref 0.7–4.0)
MCH: 25.8 pg — ABNORMAL LOW (ref 26.0–34.0)
MCHC: 31.2 g/dL (ref 30.0–36.0)
MCV: 82.7 fL (ref 80.0–100.0)
Monocytes Absolute: 0.8 10*3/uL (ref 0.1–1.0)
Monocytes Relative: 12 %
Neutro Abs: 4.4 10*3/uL (ref 1.7–7.7)
Neutrophils Relative %: 65 %
Platelets: 244 10*3/uL (ref 150–400)
RBC: 3.88 MIL/uL (ref 3.87–5.11)
RDW: 15.8 % — ABNORMAL HIGH (ref 11.5–15.5)
WBC: 6.7 10*3/uL (ref 4.0–10.5)
nRBC: 0 % (ref 0.0–0.2)

## 2019-01-29 LAB — BASIC METABOLIC PANEL
Anion gap: 5 (ref 5–15)
BUN: 17 mg/dL (ref 8–23)
CO2: 22 mmol/L (ref 22–32)
Calcium: 8.7 mg/dL — ABNORMAL LOW (ref 8.9–10.3)
Chloride: 113 mmol/L — ABNORMAL HIGH (ref 98–111)
Creatinine, Ser: 1.2 mg/dL — ABNORMAL HIGH (ref 0.44–1.00)
GFR calc Af Amer: 49 mL/min — ABNORMAL LOW (ref >60)
GFR calc non Af Amer: 42 mL/min — ABNORMAL LOW (ref >60)
Glucose, Bld: 128 mg/dL — ABNORMAL HIGH (ref 70–99)
Potassium: 4 mmol/L (ref 3.5–5.1)
Sodium: 140 mmol/L (ref 135–145)

## 2019-01-29 LAB — HEPARIN LEVEL (UNFRACTIONATED): Heparin Unfractionated: 1.07 IU/mL — ABNORMAL HIGH (ref 0.30–0.70)

## 2019-01-29 LAB — URINALYSIS, ROUTINE W REFLEX MICROSCOPIC
Bilirubin Urine: NEGATIVE
Glucose, UA: NEGATIVE mg/dL
Hgb urine dipstick: NEGATIVE
Ketones, ur: NEGATIVE mg/dL
Nitrite: NEGATIVE
Protein, ur: 30 mg/dL — AB
Specific Gravity, Urine: 1.011 (ref 1.005–1.030)
pH: 6 (ref 5.0–8.0)

## 2019-01-29 LAB — CREATININE, URINE, RANDOM: Creatinine, Urine: 88.5 mg/dL

## 2019-01-29 LAB — SODIUM, URINE, RANDOM: Sodium, Ur: 68 mmol/L

## 2019-01-29 MED ORDER — ELIQUIS 5 MG VTE STARTER PACK: ORAL_TABLET | ORAL | 3 refills | Status: DC

## 2019-01-29 MED ORDER — APIXABAN 5 MG PO TABS: 5.0000 mg | ORAL_TABLET | Freq: Two times a day (BID) | ORAL | Status: DC

## 2019-01-29 MED ORDER — APIXABAN 5 MG PO TABS
10.0000 mg | ORAL_TABLET | Freq: Two times a day (BID) | ORAL | Status: DC
  Administered 2019-01-29: 10 mg via ORAL
  Filled 2019-01-29: qty 2

## 2019-01-29 NOTE — Discharge Summary (Signed)
Physician Discharge Summary  [name] [name] [id number] DOB: May 04, 1935 DOA: 01/27/2019  PCP: Andi Devon, MD  Admit date: 01/27/2019 Discharge date: 01/29/2019  Admitted From: Home  Discharge disposition: Home   Recommendations for Outpatient Follow-Up:    Follow up with your primary care provider in one week.    Discharge Diagnosis:   Principal Problem:   DVT (deep venous thrombosis) (HCC) Active Problems:   Elevated troponin   Abdominal pain   AKI (acute kidney injury) (HCC)   Chronic anemia   B12 deficiency   DVT, lower extremity (HCC)   Discharge Condition: Improved.  Diet recommendation: Low sodium, heart healthy.    Wound care: None.  Code status: Full.   History of Present Illness:   84 year old female with history of left leg DVT who was treated with Eliquis for about 6 months and he stopped about a year ago.Patient is overall poor historian.  She has history of dementia, arthritis, hypertension.  Lives with her daughter.    Patient stated 1 day 1 or 2 days history of noticing swelling of the left leg.   Hospital Course:  Patient was admitted to hospital and following conditions were addressed during hospitalization,  Acute kidney injury, improved with hydration.  Creatinine prior to discharge was 1.2.  Extensive left lower extremity DVT. Second episode of extensive DVT.  Does not have clinical evidence of PE.  Continue with Eliquis for anticoagulation.  I have also spoken with the patient's family regarding the need for continuation of anticoagulation.  Elevated troponins: Without chest pain.  Suspect type II non-STEMI.  EKG nonischemic.  Troponin remained flat.  With AKI and presence of blood clot.  Continue on anticoagulation on discharge.  Disposition.  At this time, patient is stable for disposition home.  I also spoke with the patient's daughter on the phone and updated her about the clinical condition of the patient and the plan for  disposition.  Medical Consultants:    None.   Subjective:   Today, patient feels great.  Wishes to go home.  Has mild leg pain.  Denies any chest pain, shortness of breath, fever or chills.  Discharge Exam:   Vitals:   01/29/19 0542 01/29/19 0905  BP: (!) 137/57 115/60  Pulse: 67 60  Resp:  17  Temp:  98 F (36.7 C)  SpO2:  100%   Vitals:   01/28/19 1718 01/28/19 2054 01/29/19 0542 01/29/19 0905  BP: 131/67 116/75 (!) 137/57 115/60  Pulse: 60 (!) 53 67 60  Resp: 18 19  17   Temp: (!) 97.4 F (36.3 C) 98.8 F (37.1 C)  98 F (36.7 C)  TempSrc: Oral Oral  Oral  SpO2: 98% 100%  100%  Weight:  70.1 kg    Height:        General exam: Appears calm and comfortable ,Not in distress HEENT:PERRL,Oral mucosa moist Respiratory system: Bilateral equal air entry, normal vesicular breath sounds, no wheezes or crackles  Cardiovascular system: S1 & S2 heard, RRR.  Gastrointestinal system: Abdomen is nondistended, soft and nontender. No organomegaly or masses felt. Normal bowel sounds heard. Central nervous system: Alert and oriented. No focal neurological deficits. Extremities: Mild left lower extremity edema., no clubbing ,no cyanosis, distal peripheral pulses palpable. Skin: No rashes, lesions or ulcers,no icterus ,no pallor MSK: Normal muscle bulk,tone ,power    Procedures:    None  The results of significant diagnostics from this hospitalization (including imaging, microbiology, ancillary and laboratory) are listed below for reference.  Diagnostic Studies:   Ct Abdomen Pelvis Wo Contrast  Result Date: 01/28/2019 CLINICAL DATA:  Left leg swelling.  Abdominal pain EXAM: CT ABDOMEN AND PELVIS WITHOUT CONTRAST TECHNIQUE: Multidetector CT imaging of the abdomen and pelvis was performed following the standard protocol without IV contrast. COMPARISON:  11/23/2010 FINDINGS: Lower chest: Lung bases are clear. No effusions. Heart is normal size. Scattered coronary artery and  aortic calcifications. Hepatobiliary: No focal hepatic abnormality. Gallbladder unremarkable. Pancreas: Pancreatic atrophy. No focal abnormality or ductal dilatation. Spleen: No focal abnormality.  Normal size. Adrenals/Urinary Tract: No adrenal abnormality. No focal renal abnormality. No stones or hydronephrosis. Urinary bladder is unremarkable. Stomach/Bowel: Normal appendix. Stomach, large and small bowel grossly unremarkable. Few scattered sigmoid diverticula. No active diverticulitis. Vascular/Lymphatic: Aortic atherosclerosis. No enlarged abdominal or pelvic lymph nodes. The left external iliac vein and common femoral vein appear dense and distended relative to the right concerning for acute deep venous thrombosis. Reproductive: Prior hysterectomy.  No adnexal masses. Other: No free fluid or free air. There is stranding in the left pelvis surrounding the left iliofemoral vessels, likely related to acute DVT within the left external iliac and possibly common femoral veins. Musculoskeletal: No acute bony abnormality. Degenerative changes in the lumbar spine. IMPRESSION: Dense, distended left external iliac and common femoral veins with surrounding stranding, most compatible with acute deep venous thrombosis. Aortic atherosclerosis. Electronically Signed   By: Charlett Nose M.D.   On: 01/28/2019 02:13   US Renal  Result Date: 01/28/2019 CLINICAL DATA:  Acute kidney injury EXAM: RENAL / URINARY TRACT ULTRASOUND COMPLETE COMPARISON:  Abdominal CT from earlier today FINDINGS: Right Kidney: Renal measurements: 9 x 4.4 x 5.1 cm = volume: 104 mL. Echogenic cortex diffusely. No hydronephrosis or mass. Left Kidney: Renal measurements: 8.6 x 3.9 x 3.9 cm = volume: 70 mL. Echogenic cortex diffusely. No hydronephrosis or solid mass. Incidental 15 mm simple cyst. Bladder: Appears normal for degree of bladder distention. IMPRESSION: Medical renal disease with symmetric atrophy.  No hydronephrosis. Electronically Signed    By: Marnee Spring M.D.   On: 01/28/2019 05:07   Dg Chest Portable 1 View  Result Date: 01/28/2019 CLINICAL DATA:  Throat swelling EXAM: PORTABLE CHEST 1 VIEW COMPARISON:  11/23/2018 FINDINGS: Cardiomegaly. Low lung volumes without confluent opacity, effusion or edema. No acute bony abnormality. IMPRESSION: Cardiomegaly, low volumes.  No active disease. Electronically Signed   By: Charlett Nose M.D.   On: 01/28/2019 00:45   Vas [place] Lower Extremity Venous (dvt) (only [facility])  Result Date: 01/28/2019  Lower Venous Study Indications: Swelling, and Iliac vein clot seen on CT. History of left lower extremity DVT.  Limitations: Poor patient cooperation and contraction. Performing Technologist: Gertie Fey MHA, RDMS, RVT, RDCS  Examination Guidelines: A complete evaluation includes B-mode imaging, spectral Doppler, color Doppler, and power Doppler as needed of all accessible portions of each vessel. Bilateral testing is considered an integral part of a complete examination. Limited examinations for reoccurring indications may be performed as noted.  +-----+---------------+---------+-----------+----------+-------+  RIGHT Compressibility Phasicity Spontaneity Properties Summary  +-----+---------------+---------+-----------+----------+-------+  CFV   Full            Yes       Yes                             +-----+---------------+---------+-----------+----------+-------+   +---------+---------------+---------+-----------+----------+--------------+  LEFT      Compressibility Phasicity Spontaneity Properties Summary         +---------+---------------+---------+-----------+----------+--------------+  CFV       None                      No                     Acute           +---------+---------------+---------+-----------+----------+--------------+  SFJ       None                                             Acute           +---------+---------------+---------+-----------+----------+--------------+  FV Prox   None                                              Acute           +---------+---------------+---------+-----------+----------+--------------+  FV Mid    None                                             Acute           +---------+---------------+---------+-----------+----------+--------------+  FV Distal None                                             Acute           +---------+---------------+---------+-----------+----------+--------------+  PFV       None                                             Acute           +---------+---------------+---------+-----------+----------+--------------+  POP       None                      No                     Acute           +---------+---------------+---------+-----------+----------+--------------+  PTV                                                        Not visualized  +---------+---------------+---------+-----------+----------+--------------+  PERO                                                       Not visualized  +---------+---------------+---------+-----------+----------+--------------+  EIV                                 No  Acute           +---------+---------------+---------+-----------+----------+--------------+  CIV                                                        Not visualized  +---------+---------------+---------+-----------+----------+--------------+     Summary: Left: Findings consistent with acute deep vein thrombosis involving the left external iliac vein, left common femoral vein, left femoral vein, left proximal profunda vein, and left popliteal vein. No cystic structure found in the popliteal fossa. Unable to visualize left common iliac vein and IVC due to overlying bowel gas.  *See table(s) above for measurements and observations. Electronically signed by Waverly Ferrari MD on 01/28/2019 at 1:58:51 PM.    Final      Labs:   Basic Metabolic Panel: Recent Labs  Lab 01/28/19 0037 01/28/19 0411 01/29/19 0735  NA 137 138  140  K 4.6 4.5 4.0  CL 104 104 113*  CO2 19* 21* 22  GLUCOSE 147* 130* 128*  BUN 31* 29* 17  CREATININE 2.25* 2.05* 1.20*  CALCIUM 9.0 9.0 8.7*   GFR Estimated Creatinine Clearance: 31.3 mL/min (A) (by C-G formula based on SCr of 1.2 mg/dL (H)). Liver Function Tests: Recent Labs  Lab 01/28/19 0037  AST 17  ALT 10  ALKPHOS 98  BILITOT 0.5  PROT 8.3*  ALBUMIN 3.3*   Recent Labs  Lab 01/28/19 0037  LIPASE 21   No results for input(s): AMMONIA in the last 168 hours. Coagulation profile No results for input(s): INR, PROTIME in the last 168 hours.  CBC: Recent Labs  Lab 01/28/19 0037 01/29/19 0735  WBC 9.2 6.7  NEUTROABS 6.4 4.4  HGB 10.6* 10.0*  HCT 34.7* 32.1*  MCV 84.4 82.7  PLT 219 244   Cardiac Enzymes: Recent Labs  Lab 01/28/19 0037 01/28/19 0411 01/28/19 1002  TROPONINI 0.61* 0.64* 0.74*   BNP: Invalid input(s): POCBNP CBG: No results for input(s): GLUCAP in the last 168 hours. D-Dimer Recent Labs    01/28/19 0037  DDIMER 19.73*   Hgb A1c No results for input(s): HGBA1C in the last 72 hours. Lipid Profile No results for input(s): CHOL, HDL, LDLCALC, TRIG, CHOLHDL, LDLDIRECT in the last 72 hours. Thyroid function studies No results for input(s): TSH, T4TOTAL, T3FREE, THYROIDAB in the last 72 hours.  Invalid input(s): FREET3 Anemia work up Recent Labs    01/28/19 0411  VITAMINB12 153*  FOLATE 9.0  FERRITIN 665*  TIBC 193*  IRON 12*  RETICCTPCT 0.6   Microbiology Recent Results (from the past 240 hour(s))  SARS Coronavirus 2 (CEPHEID- Performed in [facility] hospital lab), Hosp Order     Status: None   Collection Time: 01/28/19  3:22 AM  Result Value Ref Range Status   SARS Coronavirus 2 NEGATIVE NEGATIVE Final    Comment: (NOTE) If result is NEGATIVE SARS-CoV-2 target nucleic acids are NOT DETECTED. The SARS-CoV-2 RNA is generally detectable in upper and lower  respiratory specimens during the acute phase of infection. The  lowest  concentration of SARS-CoV-2 viral copies this assay can detect is 250  copies / mL. A negative result does not preclude SARS-CoV-2 infection  and should not be used as the sole basis for treatment or other  patient management decisions.  A negative result may occur with  improper specimen collection / handling,  submission of specimen other  than nasopharyngeal swab, presence of viral mutation(s) within the  areas targeted by this assay, and inadequate number of viral copies  (<250 copies / mL). A negative result must be combined with clinical  observations, patient history, and epidemiological information. If result is POSITIVE SARS-CoV-2 target nucleic acids are DETECTED. The SARS-CoV-2 RNA is generally detectable in upper and lower  respiratory specimens dur ing the acute phase of infection.  Positive  results are indicative of active infection with SARS-CoV-2.  Clinical  correlation with patient history and other diagnostic information is  necessary to determine patient infection status.  Positive results do  not rule out bacterial infection or co-infection with other viruses. If result is PRESUMPTIVE POSTIVE SARS-CoV-2 nucleic acids MAY BE PRESENT.   A presumptive positive result was obtained on the submitted specimen  and confirmed on repeat testing.  While 2019 novel coronavirus  (SARS-CoV-2) nucleic acids may be present in the submitted sample  additional confirmatory testing may be necessary for epidemiological  and / or clinical management purposes  to differentiate between  SARS-CoV-2 and other Sarbecovirus currently known to infect humans.  If clinically indicated additional testing with an alternate test  methodology [phone]([id number]) is advised. The SARS-CoV-2 RNA is generally  detectable in upper and lower respiratory sp ecimens during the acute  phase of infection. The expected result is Negative. Fact Sheet for Patients:   BoilerBrush.com.cyhttps://www.fda.gov/media/136312/download Fact Sheet for Healthcare Providers: https://pope.com/https://www.fda.gov/media/136313/download This test is not yet approved or cleared by the [place] FDA and has been authorized for detection and/or diagnosis of SARS-CoV-2 by FDA under an Emergency Use Authorization (EUA).  This EUA will remain in effect (meaning this test can be used) for the duration of the COVID-19 declaration under Section 564(b)(1) of the Act, 21 U.S.C. section 360bbb-3(b)(1), unless the authorization is terminated or revoked sooner. Performed at [facility], [address] [place], [place] [address]      Discharge Instructions:   Discharge Instructions    Call MD for:  difficulty breathing, headache or visual disturbances   Complete by:  As directed    Call MD for:  redness, tenderness, or signs of infection (pain, swelling, redness, odor or green/yellow discharge around incision site)   Complete by:  As directed    Call MD for:  severe uncontrolled pain   Complete by:  As directed    Diet - low sodium heart healthy   Complete by:  As directed    Discharge instructions   Complete by:  As directed    Please continue Eliquis (blood thinner) without interruption.  Follow-up with your primary care physician within 1 week.   Increase activity slowly   Complete by:  As directed      Allergies as of 01/29/2019   No Known Allergies     Medication List    TAKE these medications   acetaminophen-codeine 300-30 MG tablet Commonly known as:  TYLENOL #3 Take 1-2 tablets by mouth every 6 (six) hours as needed for moderate pain.   amLODipine 10 MG tablet Commonly known as:  NORVASC Take 1 tablet (10 mg total) by mouth daily.   atorvastatin 20 MG tablet Commonly known as:  LIPITOR Take 20 mg by mouth daily at 6 PM.   carvedilol 3.125 MG tablet Commonly known as:  COREG Take 1 tablet (3.125 mg total) by mouth 2 (two) times daily with a meal.   CoQ10 100 MG Caps Take  1 tablet by mouth  daily.   cyclobenzaprine 10 MG tablet Commonly known as:  FLEXERIL Take 1 tablet (10 mg total) by mouth 2 (two) times daily as needed for muscle spasms.   docusate sodium 100 MG capsule Commonly known as:  COLACE Take 1 capsule (100 mg total) by mouth daily.   Eliquis DVT/PE Starter Pack 5 MG Tabs Take as directed on package: start with two-5mg  tablets twice daily for 7 days. On day 8, switch to one-5mg  tablet twice daily.   feeding supplement (ENSURE ENLIVE) Liqd Take 237 mLs by mouth 2 (two) times daily between meals.   lisinopril 5 MG tablet Commonly known as:  ZESTRIL Take 7.5 mg by mouth daily.   traMADol 50 MG tablet Commonly known as:  ULTRAM Take 50 mg by mouth every 6 (six) hours as needed for pain.      Time coordinating discharge: 39 minutes  Signed:  Isaac Lacson  [facility] 01/29/2019, 10:28 AM

## 2019-01-29 NOTE — Progress Notes (Signed)
[name] [name] to be discharged Home per MD order. Discussed prescriptions and follow up appointments with the patient's daughter. Prescription and medication list explained in detail. Patient's daughter verbalized understanding.  Skin clean, dry and intact without evidence of skin break down, no evidence of skin tears noted. IV catheter discontinued intact. Site without signs and symptoms of complications. Dressing and pressure applied. Pt denies pain at the site currently. No complaints noted.  Patient free of lines, drains, and wounds.   An After Visit Summary (AVS) was printed and given to the patient's daughter. Patient escorted via wheelchair, and discharged home via private auto.  Arvilla Meres, RN

## 2019-01-29 NOTE — TOC Initial Note (Signed)
Transition of Care [facility]) - Initial/Assessment Note    Patient Details  Name: [name] [name] MRN: [id number] Date of Birth: July 06, 1935  Transition of Care [facility]) CM/SW Contact:    Bess Kinds, RN Phone Number: 01/29/2019, 12:55 PM  Clinical Narrative:                 PTA home with daughter - uses walker at baseline - alzheimer. Advised by MD that patient would be going home on Eliquis. Benefits check requested. Provided 30 day trial card to nurse to give to daughter. Daughter to transport patient home in private vehicle today. No other transition of care needs identified at this time.    Expected Discharge Plan: Home/Self Care Barriers to Discharge: No Barriers Identified   Patient Goals and CMS Choice     Choice offered to / list presented to : NA  Expected Discharge Plan and Services Expected Discharge Plan: Home/Self Care In-house Referral: NA Discharge Planning Services: CM Consult, Medication Assistance Post Acute Care Choice: NA Living arrangements for the past 2 months: Single Family Home Expected Discharge Date: 01/29/19               DME Arranged: N/A DME Agency: NA       HH Arranged: NA HH Agency: NA        Prior Living Arrangements/Services Living arrangements for the past 2 months: Single Family Home Lives with:: Self, Adult Children(daughter)              Current home services: DME(walker)    Activities of Daily Living Home Assistive Devices/Equipment: Other (Comment)(pt confused) ADL Screening (condition at time of admission) Patient's cognitive ability adequate to safely complete daily activities?: No Is the patient deaf or have difficulty hearing?: Yes Does the patient have difficulty seeing, even when wearing glasses/contacts?: No Does the patient have difficulty concentrating, remembering, or making decisions?: Yes Patient able to express need for assistance with ADLs?: Yes Does the patient have difficulty dressing or bathing?:  Yes Independently performs ADLs?: No Communication: Independent Dressing (OT): Dependent Is this a change from baseline?: Pre-admission baseline Grooming: Dependent Is this a change from baseline?: Pre-admission baseline Feeding: Needs assistance Is this a change from baseline?: Pre-admission baseline Bathing: Dependent Is this a change from baseline?: Pre-admission baseline Toileting: Dependent Is this a change from baseline?: Pre-admission baseline In/Out Bed: Dependent Is this a change from baseline?: Pre-admission baseline Walks in Home: Dependent Is this a change from baseline?: Pre-admission baseline Does the patient have difficulty walking or climbing stairs?: Yes Weakness of Legs: Both Weakness of Arms/Hands: None  Permission Sought/Granted                  Emotional Assessment              Admission diagnosis:  Elevated troponin [R79.89] AKI (acute kidney injury) (HCC) [N17.9] Acute thromboembolism of deep veins of left lower extremity (HCC) [I82.402] Patient Active Problem List   Diagnosis Date Noted  . DVT (deep venous thrombosis) (HCC) 01/28/2019  . Abdominal pain 01/28/2019  . AKI (acute kidney injury) (HCC) 01/28/2019  . Chronic anemia 01/28/2019  . B12 deficiency 01/28/2019  . DVT, lower extremity (HCC) 01/28/2019  . Memory loss 07/02/2018  . Insomnia 07/02/2018  . Palliative care encounter 07/02/2018  . Hypertensive urgency   . Chest pain 11/23/2017  . Elevated troponin 11/23/2017  . DVT, femoral, chronic (HCC) 11/23/2017  . Acute on chronic renal insufficiency 11/23/2017  . ARF (acute renal failure) (  HCC) 09/13/2017  . Dementia (HCC) 09/13/2017  . Essential hypertension 09/13/2017  . Arthritis 09/13/2017  . Chronic back pain 09/13/2017  . Confusion 06/05/2012   PCP:  Andi DevonShelton, Kimberly, MD Pharmacy:   [facility] [place], [place] - [id number] [place] [address] Phone: [phone] Fax:  [phone]  [id number] [place], [place] - [address] [place] [address] Phone: [phone] Fax: [phone]     Social Determinants of Health (SDOH) Interventions    Readmission Risk Interventions No flowsheet data found.

## 2019-01-29 NOTE — Progress Notes (Signed)
ANTICOAGULATION CONSULT NOTE - Follow-Up Consult  Pharmacy Consult for heparin Indication: Concern for DVT/PE  No Known Allergies  Patient Measurements: Height: 4\' 11"  (149.9 cm) Weight: 154 lb 8.7 oz (70.1 kg) IBW/kg (Calculated) : 43.2 Heparin Dosing Weight: 75kg  Vital Signs: Temp: 98.8 F (37.1 C) (05/05 2054) Temp Source: Oral (05/05 2054) BP: 137/57 (05/06 0542) Pulse Rate: 67 (05/06 0542)  Labs: Recent Labs    01/28/19 0037 01/28/19 0411 01/28/19 1002 01/28/19 1650 01/29/19 0735  HGB 10.6*  --   --   --  10.0*  HCT 34.7*  --   --   --  32.1*  PLT 219  --   --   --  244  HEPARINUNFRC  --   --   --  <0.10* 1.07*  CREATININE 2.25* 2.05*  --   --   --   TROPONINI 0.61* 0.64* 0.74*  --   --     Estimated Creatinine Clearance: 18.3 mL/min (A) (by C-G formula based on SCr of 2.05 mg/dL (H)).   Medical History: Past Medical History:  Diagnosis Date  . Arthritis   . Dementia (HCC)   . Hypertension    Assessment: 29 YOF presenting with leg swelling, elevated D-dimer and hx of DVT to start heparin.  No anticoagulation PTA, chronic anemia stable, plts 219.    Heparin level SUPRA therapeutic at 1.07 this AM. Plan as d/w Dr. Jerral Ralph on 5/5 was to change patient to apixaban 10mg  BID. D/W MD today, Dr. Tyson Babinski. Will d/c heparin for 2 hours and then start apixaban 10mg  BID per MD x 7 days. Will monitor for bleeding  Goal of Therapy:  Heparin level 0.3-0.7 units/ml Monitor platelets by anticoagulation protocol: Yes   Plan:  D/C heparin drip Start Apixaban 10mg  PO BID x 7 days, then 5mg  BID thereafter.  Monitor for bleeding   Joci Dress A. Jeanella Craze, PharmD, BCPS Clinical Pharmacist [place] Please utilize Amion for appropriate phone number to reach the unit pharmacist [facility])   01/29/2019

## 2019-01-29 NOTE — TOC Benefit Eligibility Note (Signed)
Transition of Care [facility]) Benefit Eligibility Note    Patient Details  Name: [name] [name] MRN: [id number] Date of Birth: 05-04-1935   Medication/Dose: Everlene Balls  5 MG BID  Covered?: Yes     Prescription Coverage Preferred Pharmacy: YES([facility])  Spoke with Person/Company/Phone Number:: JADA([employer] # [phone])  Co-Pay: $ 47.00  Prior Approval: No  Deductible: Unmet       Mardene Sayer Phone Number: 01/29/2019, 10:56 AM

## 2019-02-08 IMAGING — US US EXTREM LOW VENOUS*L*
1 series · 13 of 24 positions shown · non-contrast
Comparison: None.

CLINICAL DATA: Left ankle pain and edema for the past 2 days.
Evaluate for DVT.



[Series 1: us extrem low venous*left* · 0.07mm/px · 13 of 36 slices shown]
[im 1/36]
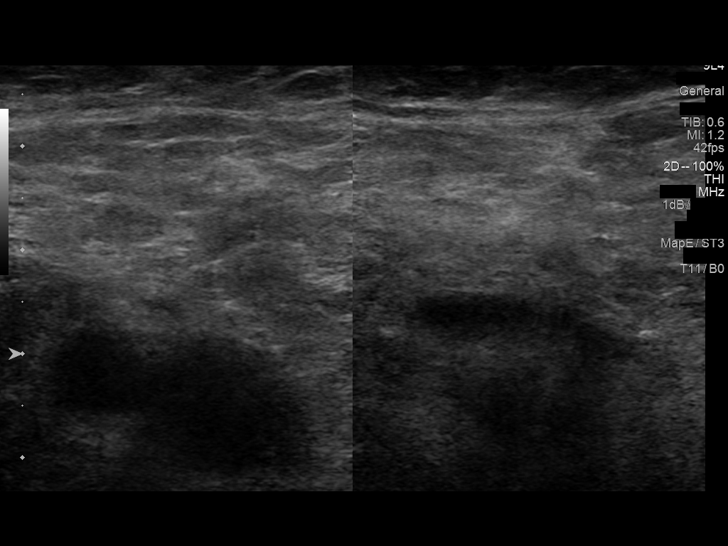
[im 4/36]
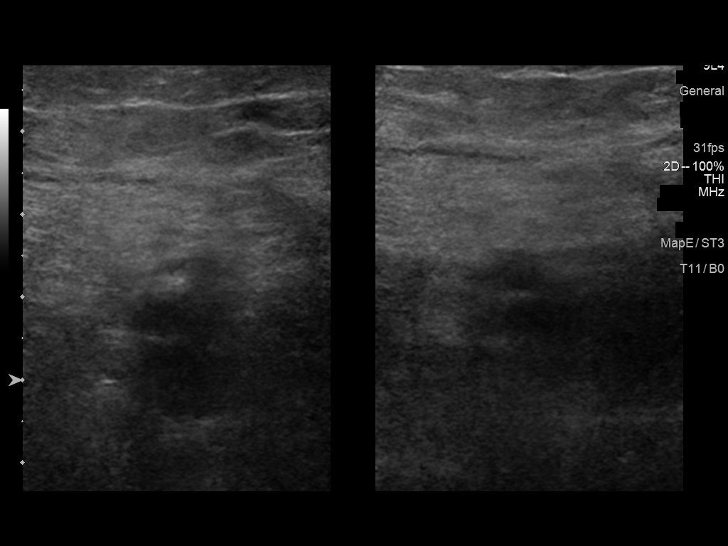
[im 7/36]
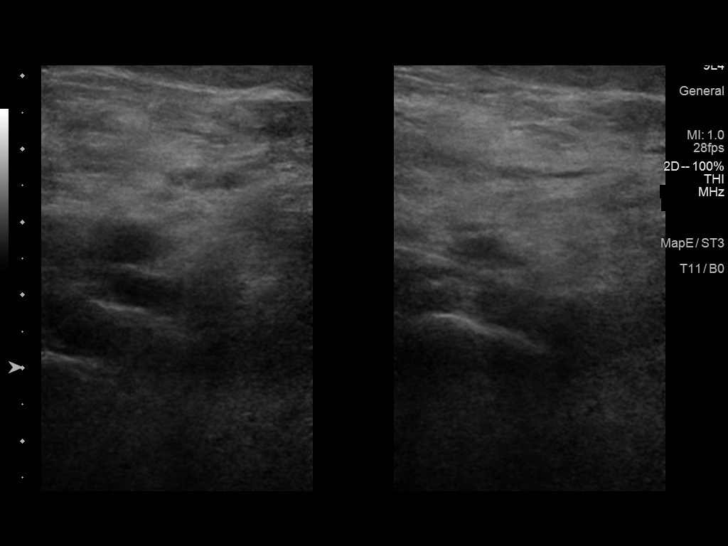
[im 10/36]
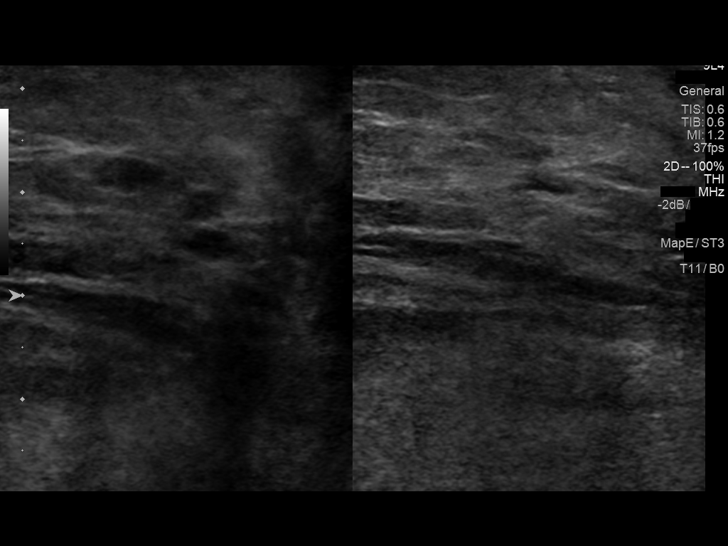
[im 13/36]
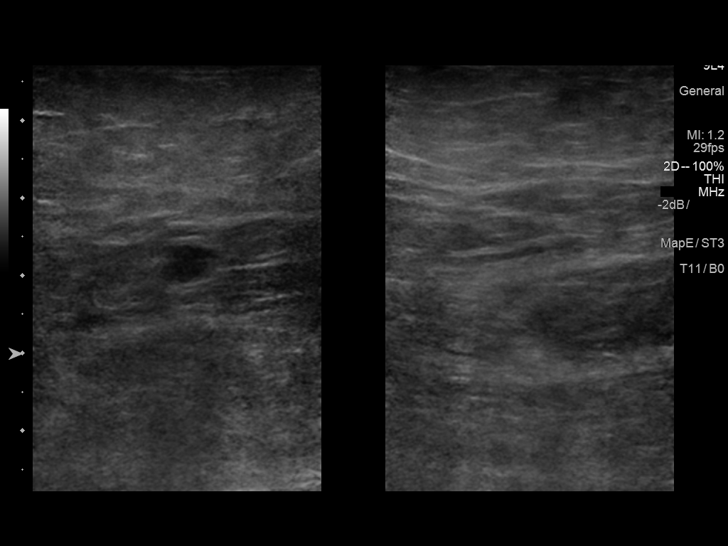
[im 16/36]
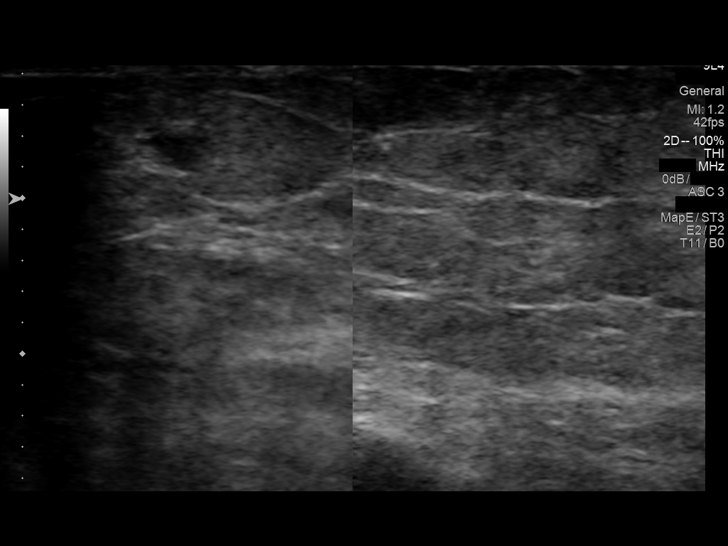
[im 19/36]
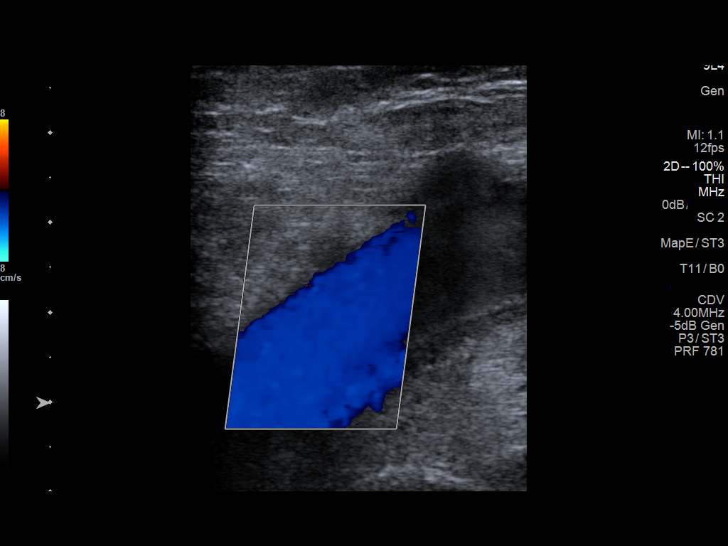
[im 20/36]
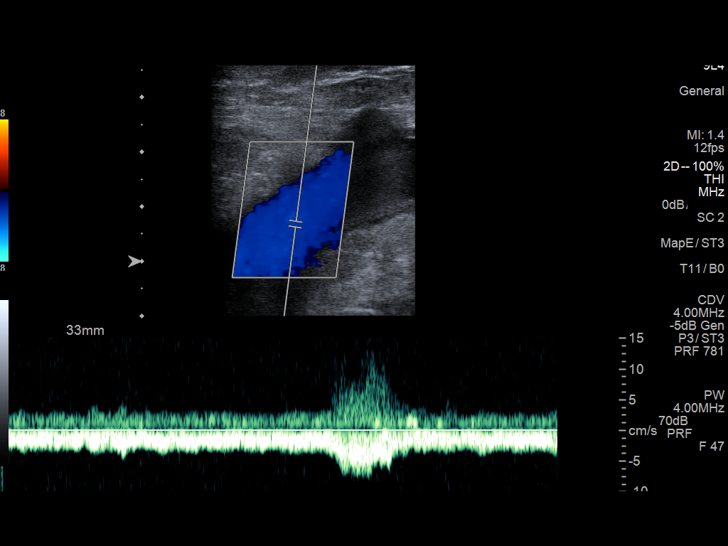
[im 23/36]
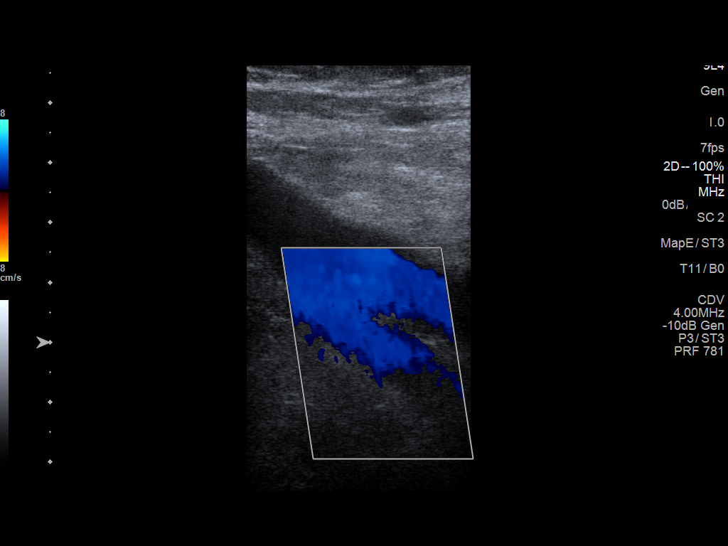
[im 26/36]
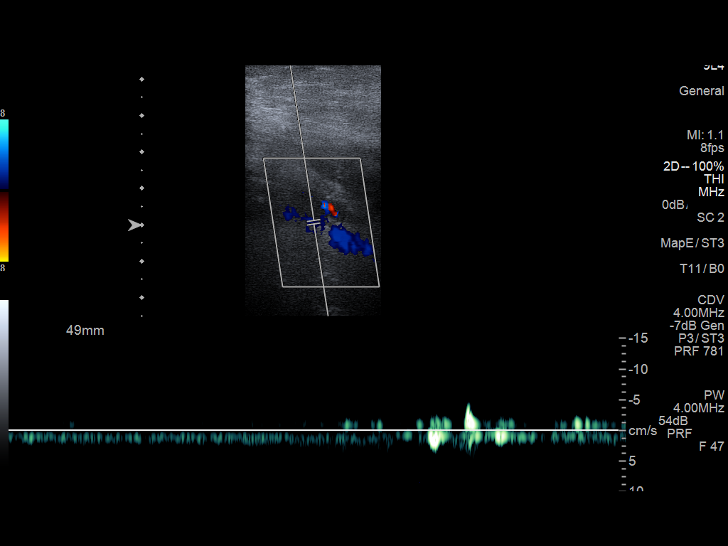
[im 29/36]
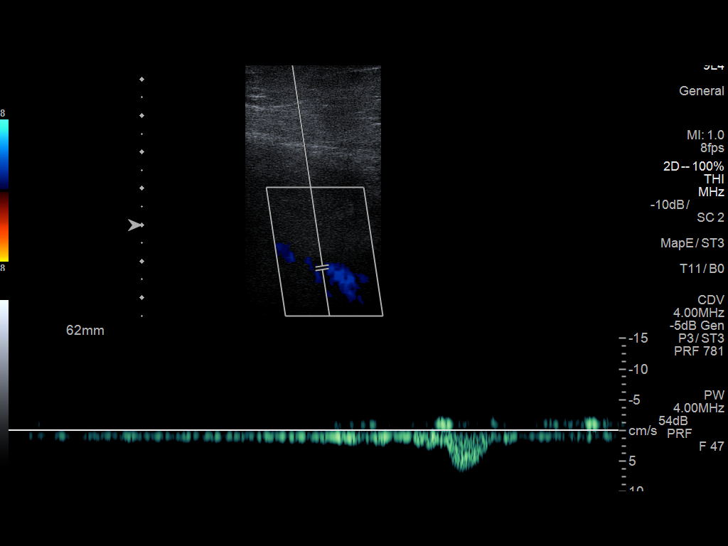
[im 32/36]
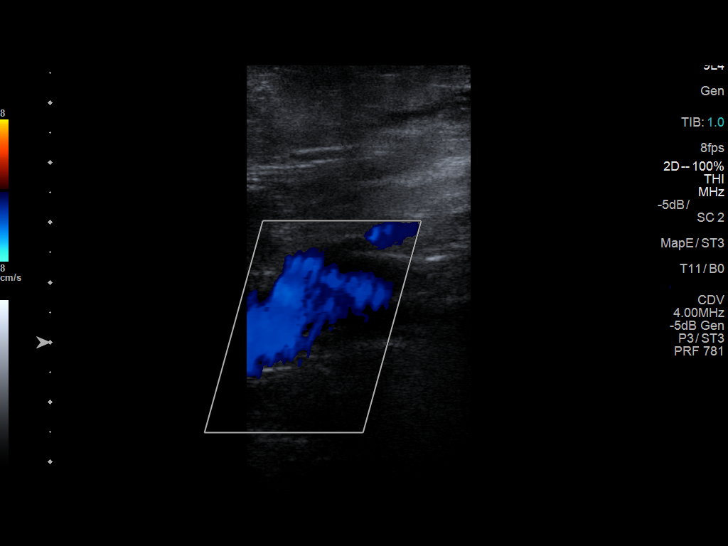
[im 36/36]
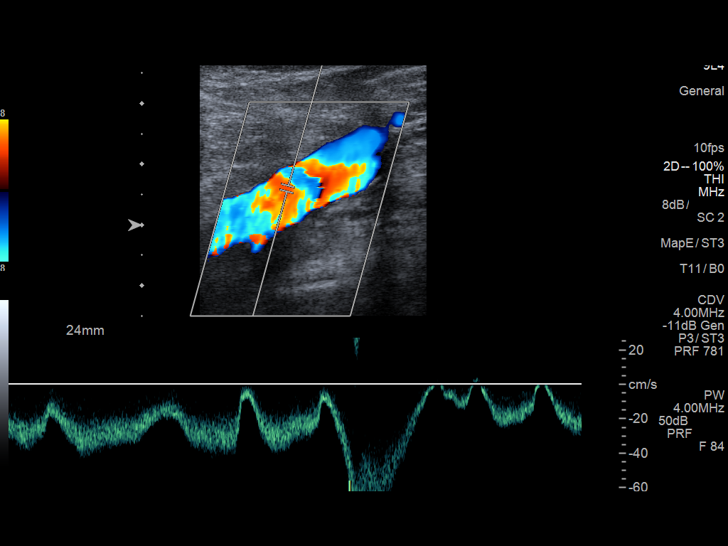

[13 of 24 positions shown; findings below may reference images not displayed]

FINDINGS: Examination is degraded due to patient body habitus and poor
sonographic window.

Contralateral Common Femoral Vein: Respiratory phasicity is normal
and symmetric with the symptomatic side. No evidence of thrombus.
Normal compressibility.

Common Femoral Vein: No evidence of thrombus. Normal
compressibility, respiratory phasicity and response to augmentation.

Saphenofemoral Junction: No evidence of thrombus. Normal
compressibility and flow on color Doppler imaging.

Profunda Femoral Vein: No evidence of thrombus. Normal
compressibility and flow on color Doppler imaging.

Femoral Vein: There is nonocclusive wall thickening seen throughout
the proximal (image 27), mid (image 30) and distal (image 32)
aspects of the femoral vein. Additionally, there is apparent mural
calcifications seen with the proximal left femoral vein (image 27).

Popliteal Vein: No evidence of thrombus. Normal compressibility,
respiratory phasicity and response to augmentation.

Calf Veins: No evidence of thrombus. Normal compressibility and flow
on color Doppler imaging.

Superficial Great Saphenous Vein: No evidence of thrombus. Normal
compressibility.

Venous Reflux:  None.

Other Findings:  None.
IMPRESSION: Body habitus degraded examination demonstrates apparent nonocclusive
wall thickening/chronic DVT throughout the femoral vein. In the
absence of prior examinations, an acute on chronic process is not
excluded however overall appearance favors a chronic etiology.
Clinical correlation is advised.

## 2019-02-11 ENCOUNTER — Telehealth: Payer: Self-pay | Admitting: Adult Health Nurse Practitioner

## 2019-02-11 NOTE — Telephone Encounter (Signed)
Daughter called me back and set up a Zoom meeting on 02/14/2019 at 10:30 am Hallee Mckenny K. Garner Nash NP

## 2019-02-11 NOTE — Telephone Encounter (Signed)
Left VM to set up appt.   Virginio Isidore K. Garner Nash NP

## 2019-02-11 NOTE — Telephone Encounter (Signed)
Left VM with daughter to set up appt.  Gave options for telephone and Zoom visits.  Left call back info Amy K. Garner Nash NP

## 2019-02-14 ENCOUNTER — Other Ambulatory Visit: Payer: Medicare Other | Admitting: Adult Health Nurse Practitioner

## 2019-02-14 ENCOUNTER — Other Ambulatory Visit: Payer: Self-pay

## 2019-02-14 ENCOUNTER — Other Ambulatory Visit: Payer: Medicare Other | Admitting: Licensed Clinical Social Worker

## 2019-02-14 DIAGNOSIS — Z515 Encounter for palliative care: Secondary | ICD-10-CM

## 2019-02-14 NOTE — Progress Notes (Signed)
[facility] Consult Note Telephone: [phone]  Fax: [phone]  PATIENT NAME: [name] [name] DOB: 24-Feb-1935 MRN: [id number]  PRIMARY CARE PROVIDER:   Andi DevonShelton, Kimberly, MD  REFERRING PROVIDER:  Andi DevonShelton, Kimberly, MD [address] [place], [place] [address]  RESPONSIBLE PARTY:   Extended Emergency Contact Information Primary Emergency Contact: Rutan,[name] Address: [address]          [place], [place] Home Phone: [phone] Work Phone: [phone] Mobile Phone: [phone] Relation: Daughter  Due to the COVID-19 crisis, this visit was done via telemedicine and it was initiated and consent by this patient and or family.       RECOMMENDATIONS and PLAN:  1.  Caregiving Strain: Daughter, Aram BeechamCynthia, does state thather sister is dealing with some medical issues and is not able to help with the care of their mother in the home like she used to.  Have reached out to SW to contact family with resources that could help.    2. Dementia:   FAST 6d. Patient is starting to have more forgetfulness.  Daughter does state that she needs more help with ADLs.  She is incontinent of bowel and bladder.  Patient is able to ambulate with a walker.  Her appetite has decreased and they are offering her smaller more frequent meals or Ensure drinks.  Patient does state that she just isn't hungry.  No changes in weight reported.  Encouraged to monitor for weight changes and to continue encouraging supplemental drinks when she doesn't eat well and to get plenty of fluids to stay hydrated.  3. DVT.  Patient recently in hospital for DVT.  Patient does history of DVT but has not been on anticoagulation for a year.  She was restarted on Eliquis.  Daughter states that the swelling has gotten worse and it is now up above her knee.  No increased pain reported.  Has not affected her mobility.  Reassured them that she is on  anticoagulation and that it could be several weeks before they saw improvement in the edema.  Did instruct that if she had any increased SOB, chest pain, or signs of stroke to go to the ER.  Continue anticoagulation with Eliquis.  4.  Goals of Care.  Full Code.  Did not change this today.  Family wants to keep their mother at home.  States that a hospital bed was ordered about 2 months ago and they still have not received it.  Called Dr. Mathews RobinsonsShelton's office and staff is going to call [facility] to see where the bed is.  Emailed Aram Beechamynthia to let her know conversation with Dr. Mathews RobinsonsShelton's office.  I spent 30 minutes providing this consultation,  from 10:30 to 11:00. More than 50% of the time in this consultation was spent coordinating communication.   HISTORY OF PRESENT ILLNESS:  [name] GammonSusie W [name] is a 84 y.o. year old female with multiple medical problems including dementia with behavioral disturbances, HTN, OA, Chronic back pain. Palliative Care was asked to help address goals of care.   CODE STATUS: full  PPS: 50% HOSPICE ELIGIBILITY/DIAGNOSIS: TBD  PHYSICAL EXAM:   General: patient sitting on couch in home in NAD Extremities: patient held left leg up and was able to see pitting edema noted and daughter states that it goes all the way above her knee Neurological:  A&O to person and place  PAST MEDICAL HISTORY:  Past Medical History:  Diagnosis Date  .  Arthritis   . Dementia (HCC)   . Hypertension     SOCIAL HX:  Social History   Tobacco Use  . Smoking status: Former Smoker    Types: Cigarettes    Last attempt to quit: 09/25/1976    Years since quitting: 42.4  . Smokeless tobacco: Never Used  Substance Use Topics  . Alcohol use: No    ALLERGIES: No Known Allergies   PERTINENT MEDICATIONS:  Outpatient Encounter Medications as of 02/14/2019  Medication Sig  . acetaminophen-codeine (TYLENOL #3) 300-30 MG tablet Take 1-2 tablets by mouth every 6 (six) hours as needed for moderate  pain.   Marland Kitchen amLODipine (NORVASC) 10 MG tablet Take 1 tablet (10 mg total) by mouth daily.  Marland Kitchen atorvastatin (LIPITOR) 20 MG tablet Take 20 mg by mouth daily at 6 PM.   . carvedilol (COREG) 3.125 MG tablet Take 1 tablet (3.125 mg total) by mouth 2 (two) times daily with a meal.  . Coenzyme Q10 (COQ10) 100 MG CAPS Take 1 tablet by mouth daily.  . cyclobenzaprine (FLEXERIL) 10 MG tablet Take 1 tablet (10 mg total) by mouth 2 (two) times daily as needed for muscle spasms.  Marland Kitchen docusate sodium (COLACE) 100 MG capsule Take 1 capsule (100 mg total) by mouth daily.  . Eliquis DVT/PE Starter Pack (ELIQUIS STARTER PACK) 5 MG TABS Take as directed on package: start with two-5mg  tablets twice daily for 7 days. On day 8, switch to one-5mg  tablet twice daily.  . feeding supplement, ENSURE ENLIVE, (ENSURE ENLIVE) LIQD Take 237 mLs by mouth 2 (two) times daily between meals.  Marland Kitchen lisinopril (PRINIVIL,ZESTRIL) 5 MG tablet Take 7.5 mg by mouth daily.  . traMADol (ULTRAM) 50 MG tablet Take 50 mg by mouth every 6 (six) hours as needed for pain.   No facility-administered encounter medications on file as of 02/14/2019.       Amy Marlena Clipper, NP

## 2019-02-18 NOTE — Progress Notes (Signed)
COMMUNITY PALLIATIVE CARE SW NOTE  PATIENT NAME: [name] [name] DOB: 04-29-1935 MRN: [id number]  PRIMARY CARE PROVIDER: Andi Devon, MD  RESPONSIBLE PARTY:  Acct ID - Guarantor Home Phone Work Phone Relationship Acct Type  [id number] [name] [name], [name] [name]* [id number] [phone] Self P/F     [address], [place], [place] [address]   Due to the COVID-19 crisis, this virtual check-in visit was done via telephone from my office and it was initiated and consent given by this patient and or family.   PLAN OF CARE and INTERVENTIONS:             1. GOALS OF CARE/ ADVANCE CARE PLANNING:  Goal if for patient to remain at her home.  She is a full code. 2. SOCIAL/EMOTIONAL/SPIRITUAL ASSESSMENT/ INTERVENTIONS:  SW contacted patient's daughter, [name] [name], per the request of [name] [name], Palliative Care NP.  SW provided active listening and supportive counseling.  [name] [name]'s sister was diagnosed with Lupus two weeks ago and is unable to assist with some aspects of caring for their mother.  SW provided the [facility] website and contact for in-home aide services.  Provided her with resources for briefs and chucks. 3. PATIENT/CAREGIVER EDUCATION/ COPING:  Daughter expresses her feelings openly. 4. PERSONAL EMERGENCY PLAN:  Family will contact EMS. 5. COMMUNITY RESOURCES COORDINATION/ HEALTH CARE NAVIGATION:  None. 6. FINANCIAL/LEGAL CONCERNS/INTERVENTIONS:  Patient is on a fixed income.     SOCIAL HX:  Social History   Tobacco Use  . Smoking status: Former Smoker    Types: Cigarettes    Last attempt to quit: 09/25/1976    Years since quitting: 42.4  . Smokeless tobacco: Never Used  Substance Use Topics  . Alcohol use: No    CODE STATUS:   Full Code  ADVANCED DIRECTIVES: N MOST FORM COMPLETE:  N HOSPICE EDUCATION PROVIDED: Y PPS:  Patient's appetite has decreased.  She ambulates with a walker. Duration of visit and documentation:  30 minutes.      [name] [name] [name] Quarry, LCSW

## 2019-06-08 ENCOUNTER — Encounter (HOSPITAL_COMMUNITY): Payer: Self-pay | Admitting: Emergency Medicine

## 2019-06-08 ENCOUNTER — Emergency Department (HOSPITAL_COMMUNITY): Payer: Medicare Other

## 2019-06-08 ENCOUNTER — Other Ambulatory Visit: Payer: Self-pay

## 2019-06-08 ENCOUNTER — Emergency Department (HOSPITAL_COMMUNITY)
Admission: EM | Admit: 2019-06-08 | Discharge: 2019-06-08 | Disposition: A | Discharge status: Home / Self Care | Payer: Medicare Other | Attending: Emergency Medicine | Admitting: Emergency Medicine

## 2019-06-08 DIAGNOSIS — Z79899 Other long term (current) drug therapy: Secondary | ICD-10-CM | POA: Diagnosis not present

## 2019-06-08 DIAGNOSIS — M7989 Other specified soft tissue disorders: Secondary | ICD-10-CM

## 2019-06-08 DIAGNOSIS — R2231 Localized swelling, mass and lump, right upper limb: Secondary | ICD-10-CM | POA: Diagnosis not present

## 2019-06-08 DIAGNOSIS — F0390 Unspecified dementia without behavioral disturbance: Secondary | ICD-10-CM | POA: Diagnosis not present

## 2019-06-08 DIAGNOSIS — Z87891 Personal history of nicotine dependence: Secondary | ICD-10-CM | POA: Insufficient documentation

## 2019-06-08 DIAGNOSIS — I10 Essential (primary) hypertension: Secondary | ICD-10-CM | POA: Insufficient documentation

## 2019-06-08 MED ORDER — PREDNISONE 20 MG PO TABS: 40.0000 mg | ORAL_TABLET | Freq: Every day | ORAL | 0 refills | Status: DC

## 2019-06-08 MED ORDER — IBUPROFEN 200 MG PO TABS
200.0000 mg | ORAL_TABLET | Freq: Once | ORAL | Status: AC
  Administered 2019-06-08: 22:00:00 200 mg via ORAL
  Filled 2019-06-08: qty 1

## 2019-06-08 MED ORDER — IBUPROFEN 200 MG PO TABS: 200.0000 mg | ORAL_TABLET | Freq: Three times a day (TID) | ORAL | 0 refills | Status: DC | PRN

## 2019-06-08 MED ORDER — PREDNISONE 20 MG PO TABS
40.0000 mg | ORAL_TABLET | Freq: Once | ORAL | Status: AC
  Administered 2019-06-08: 40 mg via ORAL
  Filled 2019-06-08: qty 2

## 2019-06-08 MED ORDER — COLCHICINE 0.6 MG PO TABS
0.6000 mg | ORAL_TABLET | Freq: Once | ORAL | Status: AC
  Administered 2019-06-08: 22:00:00 0.6 mg via ORAL
  Filled 2019-06-08: qty 1

## 2019-06-08 MED ORDER — HYDROCODONE-ACETAMINOPHEN 5-325 MG PO TABS
1.0000 | ORAL_TABLET | Freq: Once | ORAL | Status: AC
  Administered 2019-06-08: 22:00:00 1 via ORAL
  Filled 2019-06-08: qty 1

## 2019-06-08 NOTE — ED Provider Notes (Signed)
[facility] Provider Note   CSN: [id number] Arrival date & time: 06/08/19  1910     History   Chief Complaint Chief Complaint  Patient presents with  . Hand Pain    HPI Tamela GammonSusie W Montour is a 84 y.o. female.           84 year old female with right hand swelling.  Noticed by family yesterday.  Persistent today.  No history of trauma that they are aware of although they do have some concern for this.  Patient has advanced dementia and is not the most reliable historian.  She has some swelling in her left leg as well but family ports that this is chronic/stable.  She seems otherwise been acting like her normal self. Past Medical History:  Diagnosis Date  . Arthritis   . Dementia (HCC)   . Hypertension     Patient Active Problem List   Diagnosis Date Noted  . DVT (deep venous thrombosis) (HCC) 01/28/2019  . Abdominal pain 01/28/2019  . AKI (acute kidney injury) (HCC) 01/28/2019  . Chronic anemia 01/28/2019  . B12 deficiency 01/28/2019  . DVT, lower extremity (HCC) 01/28/2019  . Memory loss 07/02/2018  . Insomnia 07/02/2018  . Palliative care encounter 07/02/2018  . Hypertensive urgency   . Chest pain 11/23/2017  . Elevated troponin 11/23/2017  . DVT, femoral, chronic (HCC) 11/23/2017  . Acute on chronic renal insufficiency 11/23/2017  . ARF (acute renal failure) (HCC) 09/13/2017  . Dementia (HCC) 09/13/2017  . Essential hypertension 09/13/2017  . Arthritis 09/13/2017  . Chronic back pain 09/13/2017  . Confusion 06/05/2012    Past Surgical History:  Procedure Laterality Date  . ABDOMINAL HYSTERECTOMY    . BACK SURGERY       OB History   No obstetric history on file.      Home Medications    Prior to Admission medications   Medication Sig Start Date End Date Taking? Authorizing Provider  acetaminophen-codeine (TYLENOL #3) 300-30 MG tablet Take 1-2 tablets by mouth every 6 (six) hours as needed for moderate pain.      [provider]  amLODipine (NORVASC) 10 MG tablet Take 1 tablet (10 mg total) by mouth daily. 11/28/17   Joseph ArtVann, Jessica U, DO  atorvastatin (LIPITOR) 20 MG tablet Take 20 mg by mouth daily at 6 PM.     [provider]  carvedilol (COREG) 3.125 MG tablet Take 1 tablet (3.125 mg total) by mouth 2 (two) times daily with a meal. 11/27/17   Joseph ArtVann, Jessica U, DO  Coenzyme Q10 (COQ10) 100 MG CAPS Take 1 tablet by mouth daily.    [provider]  cyclobenzaprine (FLEXERIL) 10 MG tablet Take 1 tablet (10 mg total) by mouth 2 (two) times daily as needed for muscle spasms. 11/23/18   Liberty HandyGibbons, Claudia J, PA-C  docusate sodium (COLACE) 100 MG capsule Take 1 capsule (100 mg total) by mouth daily. 11/28/17   Joseph ArtVann, Jessica U, DO  Eliquis DVT/PE Starter Pack (ELIQUIS STARTER PACK) 5 MG TABS Take as directed on package: start with two-5mg  tablets twice daily for 7 days. On day 8, switch to one-5mg  tablet twice daily. 01/29/19   Pokhrel, Rebekah ChesterfieldLaxman, MD  feeding supplement, ENSURE ENLIVE, (ENSURE ENLIVE) LIQD Take 237 mLs by mouth 2 (two) times daily between meals. 09/15/17   Noralee Stainhoi, Jennifer, DO  lisinopril (PRINIVIL,ZESTRIL) 5 MG tablet Take 7.5 mg by mouth daily.    [provider]  traMADol (ULTRAM) 50 MG tablet  Take 50 mg by mouth every 6 (six) hours as needed for pain. 10/28/17   [provider]    Family History Family History  Problem Relation Age of Onset  . Heart disease Father   . Heart disease Brother     Social History Social History   Tobacco Use  . Smoking status: Former Smoker    Types: Cigarettes    Quit date: 09/25/1976    Years since quitting: 42.7  . Smokeless tobacco: Never Used  Substance Use Topics  . Alcohol use: No  . Drug use: No     Allergies   Patient has no known allergies.   Review of Systems Review of Systems  All systems reviewed and negative, other than as noted in HPI.  Physical Exam Updated Vital Signs BP (!) 149/75 (BP Location:  Left Arm)   Pulse 60   Temp 99.2 F (37.3 C) (Oral)   Resp 14   SpO2 99%   Physical Exam Vitals signs and nursing note reviewed.  Constitutional:      General: She is not in acute distress.    Appearance: She is well-developed.  HENT:     Head: Normocephalic and atraumatic.  Eyes:     General:        Right eye: No discharge.        Left eye: No discharge.     Conjunctiva/sclera: Conjunctivae normal.  Neck:     Musculoskeletal: Neck supple.  Cardiovascular:     Rate and Rhythm: Normal rate and regular rhythm.     Heart sounds: Normal heart sounds. No murmur. No friction rub. No gallop.   Pulmonary:     Effort: Pulmonary effort is normal. No respiratory distress.     Breath sounds: Normal breath sounds.  Abdominal:     General: There is no distension.     Palpations: Abdomen is soft.     Tenderness: There is no abdominal tenderness.  Musculoskeletal:        General: Tenderness present.     Comments: Swelling and tenderness to palpation of the mid to proximal hand and right wrist.  No concerning skin lesions or erythema.  Perhaps some mildly increased warmth.  Range of motion is limited by discomfort.  Asymmetric swelling the left lower extremity without calf tenderness.  Skin:    General: Skin is warm and dry.  Neurological:     Mental Status: She is alert.  Psychiatric:        Behavior: Behavior normal.        Thought Content: Thought content normal.      ED Treatments / Results  Labs (all labs ordered are listed, but only abnormal results are displayed) Labs Reviewed - No data to display  EKG None  Radiology Dg Hand Complete Right  Result Date: 06/08/2019 CLINICAL DATA:  Swelling of the right hand EXAM: RIGHT HAND - COMPLETE 3+ VIEW COMPARISON:  None. FINDINGS: Diffuse soft tissue swelling. Bones appear osteopenic. Arthritis at the first MCP joint and at the DIP joints. Mild deformity and lucency at the shaft and base of the fifth middle phalanx. IMPRESSION: 1.  Mild deformity and lucency at the mid shaft and base of fifth middle phalanx, possible age indeterminate fracture 2. Generalized soft tissue swelling Electronically Signed   By: Donavan Foil M.D.   On: 06/08/2019 20:20    Procedures Procedures (including critical care time)  Medications Ordered in ED Medications - No data to display   Initial Impression /  Assessment and Plan / ED Course  I have reviewed the triage vital signs and the nursing notes.  Pertinent labs & imaging results that were available during my care of the patient were reviewed by me and considered in my medical decision making (see chart for details).        84 year old female with swelling of the right hand/wrist.  Unsure if trauma or not.  Questionable fracture or little finger.  Clinically this is an old injury.  Tenderness since swelling seems to be more proximal in hand and wrist.  No clear fracture in this area.  Will treat symptomatically.  Question of possible gout.  Otherwise acting her baseline per family.  Swelling in the left lower extremity is chronic and probably not related to current symptoms. Final Clinical Impressions(s) / ED Diagnoses   Final diagnoses:  Swelling of right hand    ED Discharge Orders    None       Raeford Razor, MD 06/10/19 [phone]

## 2019-06-08 NOTE — ED Notes (Signed)
Discharge instructions was explained, verbalized understanding. Pain subsided prior to leaving floor.

## 2019-06-08 NOTE — ED Triage Notes (Signed)
Pt has been complaining of her R hand hurting and family noticed it was swollen yesterday.  Unsure how patient injured but family thinks she may have fallen.  Obvious swelling to R hand.

## 2019-08-28 ENCOUNTER — Other Ambulatory Visit: Payer: Self-pay

## 2019-08-28 ENCOUNTER — Other Ambulatory Visit: Payer: Medicare Other | Admitting: Hospice

## 2019-08-28 DIAGNOSIS — Z515 Encounter for palliative care: Secondary | ICD-10-CM

## 2019-08-28 DIAGNOSIS — F0391 Unspecified dementia with behavioral disturbance: Secondary | ICD-10-CM

## 2019-08-28 NOTE — Progress Notes (Signed)
[facility] Consult Note Telephone: [phone]  Fax: [phone]  PATIENT NAME: [name] [name] DOB: 06/19/1935 MRN: [id number]  PRIMARY CARE PROVIDER:   Willey Blade, MD  REFERRING PROVIDER:  Willey Blade, [place],  [place] [address]  RESPONSIBLE PARTY:   Extended Emergency Contact Information Primary Emergency Contact: Colclough,[name] Address: [address] [place], [place] [address] [place] [address] cell at home Home Phone: [phone] Work Phone: [phone] Mobile Phone: [phone] Relation: Daughter  Claudette - daughter  TELEHEALTH VISIT STATEMENT Due to the COVID-19 crisis, this visit was done via telephone from my office. It was initiated and consented to by this patient and/or family.  RECOMMENDATIONS/PLAN:   Advance Care Planning/Goals of Care: Telehealth Visit consisted of building trust and discussions on Palliative Medicine as specialized medical care for people living with serious illness, aimed at facilitating better quality of life through symptoms relief, assisting with advance care plan and establishing goals of care. Patient is a full code; goals of care include to maximize quality of life and symptom management. Symptom management: Patient denied pain, discomfort, shortness of breath. Ongoing memory loss r/t Dementia. FAST 6d. Daughter Caren Griffins and Claudette assist with activities of daily living. No caregiver strain at this time. No changes inpatient's  appetite. She continues on medication for history of DVT - Eliquis as ordered. Ongoing supportive care encouraged.  Follow up: Palliative care will continue to follow patient for goals of care clarification and symptom management. I spent 30  minutes providing this consultation, from 1.15pm to 1.45pm. More than 50% of the time in this consultation was spent on coordinating communication  HISTORY OF PRESENT  ILLNESS:  [name] [name] is a 84 y.o. year old female with multiple medical problems including dementia with behavioral disturbances, HTN, OA, Chronic back pain. Palliative Care was asked to help address goals of care  CODE STATUS: Full  PPS: 50% HOSPICE ELIGIBILITY/DIAGNOSIS: TBD  PAST MEDICAL HISTORY:  Past Medical History:  Diagnosis Date  . Arthritis   . Dementia ([facility])   . Hypertension     SOCIAL HX:  Social History   Tobacco Use  . Smoking status: Former Smoker    Types: Cigarettes    Quit date: 09/25/1976    Years since quitting: 42.9  . Smokeless tobacco: Never Used  Substance Use Topics  . Alcohol use: No    ALLERGIES: No Known Allergies   PERTINENT MEDICATIONS:  Outpatient Encounter Medications as of 08/28/2019  Medication Sig  . acetaminophen-codeine (TYLENOL #3) 300-30 MG tablet Take 1-2 tablets by mouth every 6 (six) hours as needed for moderate pain.   Marland Kitchen amLODipine (NORVASC) 10 MG tablet Take 1 tablet (10 mg total) by mouth daily.  Marland Kitchen atorvastatin (LIPITOR) 20 MG tablet Take 20 mg by mouth daily at 6 PM.   . carvedilol (COREG) 3.125 MG tablet Take 1 tablet (3.125 mg total) by mouth 2 (two) times daily with a meal.  . Coenzyme Q10 (COQ10) 100 MG CAPS Take 1 tablet by mouth daily.  . cyclobenzaprine (FLEXERIL) 10 MG tablet Take 1 tablet (10 mg total) by mouth 2 (two) times daily as needed for muscle spasms.  Marland Kitchen docusate sodium (COLACE) 100 MG capsule Take 1 capsule (100 mg total) by mouth daily.  . Eliquis DVT/PE Starter Pack (ELIQUIS STARTER PACK) 5 MG TABS Take as directed on package: start with two-5mg  tablets twice daily for 7 days. On day 8, switch to one-5mg   tablet twice daily.  . feeding supplement, ENSURE ENLIVE, (ENSURE ENLIVE) LIQD Take 237 mLs by mouth 2 (two) times daily between meals.  Marland Kitchen ibuprofen (ADVIL) 200 MG tablet Take 1 tablet (200 mg total) by mouth every 8 (eight) hours as needed.  Marland Kitchen lisinopril (PRINIVIL,ZESTRIL) 5 MG tablet Take 7.5 mg by mouth  daily.  . predniSONE (DELTASONE) 20 MG tablet Take 2 tablets (40 mg total) by mouth daily.  . traMADol (ULTRAM) 50 MG tablet Take 50 mg by mouth every 6 (six) hours as needed for pain.   No facility-administered encounter medications on file as of 08/28/2019.     Rosaura Carpenter, NP

## 2019-10-07 ENCOUNTER — Telehealth: Payer: Self-pay | Admitting: Hospice

## 2019-10-07 NOTE — Telephone Encounter (Signed)
Spoke with patient's daughter [name] about reschfeduling the Palliative f/u visit on 10/13/19, due to office being closed - this was rescheduled for 10/16/19 @ 12 Noon.

## 2019-10-16 ENCOUNTER — Other Ambulatory Visit: Payer: Self-pay

## 2019-10-16 ENCOUNTER — Telehealth: Payer: Self-pay | Admitting: Hospice

## 2019-10-16 ENCOUNTER — Other Ambulatory Visit: Payer: Medicare Other | Admitting: Hospice

## 2019-10-16 DIAGNOSIS — F0391 Unspecified dementia with behavioral disturbance: Secondary | ICD-10-CM

## 2019-10-16 DIAGNOSIS — Z515 Encounter for palliative care: Secondary | ICD-10-CM

## 2019-10-16 NOTE — Progress Notes (Signed)
Therapist, nutritional Palliative Care Consult Note Telephone: [phone]  Fax: [phone]  PATIENT NAME: [name] [name] DOB: 02/02/1935 MRN: [id number]  PRIMARY CARE PROVIDER:   Andi Devon, MD  REFERRING PROVIDER:  Andi Devon, MD [address] [place],  [place] [address]  RESPONSIBLE PARTY:Extended Emergency Contact Information Primary Emergency Contact: Leonor,[name]  [phone] cell at home Home Phone: [phone] Work Phone: [phone] Mobile Phone: [phone] Relation: Daughter  Jani Gravel - daughter [phone]  RECOMMENDATIONS/PLAN:   Advance Care Planning/Goals of Care:  Visit consisted of building trust and follow up on palliative care. Patient is a full code; goals of care include to maximize quality of life and symptom management. [name] said she will reconsider status in the future. Validation provided.  Symptom management: Ongoing memory loss r/t Dementia. FAST 6d. Patient with worsening  unsteady gait, fell 10/13/2019; she denied pain/discomfort. Resolving edema noted to left wrist. Encouraged elevation; advised to go to ER for Xray to rule out fracture.  Daughter [name] and Claudette assist with activities of daily living. No caregiver strain at this time.  [name] explained with unstable gait it is getting harder to get patient into bed/reposition; she requested Hospital bed.. Will reach out to PCP for this need and for PT/OT. No changes inpatient's  appetite.  Patient denied pain, discomfort, shortness of breath. Encouraged ongoing supportive care. Follow up: Palliative care will continue to follow patient for goals of care clarification and symptom management. I spent 30  minutes providing this consultation. More than 50% of the time in this consultation was spent on coordinating communication  HISTORY OF PRESENT ILLNESS:[name] W McCrayis a 85 y.o.year oldfemalewith multiple medical problems including  dementia with behavioral disturbances, HTN, OA, Chronic back pain. Palliative Care was asked to help address goals of care.  CODE STATUS: Full  PPS: 40% HOSPICE ELIGIBILITY/DIAGNOSIS: TBD  PAST MEDICAL HISTORY:  Past Medical History:  Diagnosis Date  . Arthritis   . Dementia (HCC)   . Hypertension     SOCIAL HX:  Social History   Tobacco Use  . Smoking status: Former Smoker    Types: Cigarettes    Quit date: 09/25/1976    Years since quitting: 43.0  . Smokeless tobacco: Never Used  Substance Use Topics  . Alcohol use: No    ALLERGIES: No Known Allergies   PERTINENT MEDICATIONS:  Outpatient Encounter Medications as of 10/16/2019  Medication Sig  . acetaminophen-codeine (TYLENOL #3) 300-30 MG tablet Take 1-2 tablets by mouth every 6 (six) hours as needed for moderate pain.   Marland Kitchen amLODipine (NORVASC) 10 MG tablet Take 1 tablet (10 mg total) by mouth daily.  Marland Kitchen atorvastatin (LIPITOR) 20 MG tablet Take 20 mg by mouth daily at 6 PM.   . carvedilol (COREG) 3.125 MG tablet Take 1 tablet (3.125 mg total) by mouth 2 (two) times daily with a meal.  . Coenzyme Q10 (COQ10) 100 MG CAPS Take 1 tablet by mouth daily.  . cyclobenzaprine (FLEXERIL) 10 MG tablet Take 1 tablet (10 mg total) by mouth 2 (two) times daily as needed for muscle spasms.  Marland Kitchen docusate sodium (COLACE) 100 MG capsule Take 1 capsule (100 mg total) by mouth daily.  . Eliquis DVT/PE Starter Pack (ELIQUIS STARTER PACK) 5 MG TABS Take as directed on package: start with two-5mg  tablets twice daily for 7 days. On day 8, switch to one-5mg  tablet twice daily.  . feeding supplement, ENSURE ENLIVE, (ENSURE ENLIVE) LIQD Take 237 mLs by  mouth 2 (two) times daily between meals.  Marland Kitchen ibuprofen (ADVIL) 200 MG tablet Take 1 tablet (200 mg total) by mouth every 8 (eight) hours as needed.  Marland Kitchen lisinopril (PRINIVIL,ZESTRIL) 5 MG tablet Take 7.5 mg by mouth daily.  . predniSONE (DELTASONE) 20 MG tablet Take 2 tablets (40 mg total) by mouth daily.  .  traMADol (ULTRAM) 50 MG tablet Take 50 mg by mouth every 6 (six) hours as needed for pain.   No facility-administered encounter medications on file as of 10/16/2019.    PHYSICAL EXAM:   General: NAD, frail appearing, thin Cardiovascular: regular rate and rhythm Pulmonary: clear ant fields Abdomen: soft, nontender, + bowel sounds GU: no suprapubic tenderness Extremities: no edema, no joint deformities Skin: no rashes Neurological: Weakness but otherwise nonfocal  Teodoro Spray, NP

## 2019-10-16 NOTE — Telephone Encounter (Signed)
NP called and left a voicemail as prompted for Provider/nursing explaining patient's worsening gait and need for hospital bed, PT/OT. Left call back number.

## 2019-12-31 ENCOUNTER — Other Ambulatory Visit: Payer: Self-pay

## 2019-12-31 ENCOUNTER — Other Ambulatory Visit: Payer: Medicare Other | Admitting: Hospice

## 2019-12-31 DIAGNOSIS — Z515 Encounter for palliative care: Secondary | ICD-10-CM

## 2019-12-31 DIAGNOSIS — F0391 Unspecified dementia with behavioral disturbance: Secondary | ICD-10-CM

## 2019-12-31 NOTE — Progress Notes (Signed)
Therapist, nutritional Palliative Care Consult Note Telephone: [phone]  Fax: [phone]  PATIENT NAME: [name] [name] DOB: 02-26-1935 MRN: [id number]  PRIMARY CARE PROVIDER:   Andi Devon, MD  REFERRING PROVIDER:  Andi Devon, MD [address] [place],  [place] [address]  RESPONSIBLE PARTY:Extended Emergency Contact Information Primary Emergency Contact: Malinoski,[name] [phone] cell at home Home Phone: [phone] Work Phone: [phone] Mobile Phone: [phone] Relation: Daughter Jani Gravel - daughter [phone]  TELEHEALTH VISIT STATEMENT Due to the COVID-19 crisis, this visit was done via telephone from my office. It was initiated and consented to by this patient and/or family.   RECOMMENDATIONS/PLAN:  Advance Care Planning/Goals of Care:  Visit consisted of building trust and follow up on palliative care. Patient is a full code; goals of care include to maximize quality of life and symptom management. Claudette agreed to in person visit in May 6 '21 for goals of care clarification. She said [name] will be present at the meeting.   Symptom management: Patient with worsening memory loss, worsening gait r/t Dementia; requiring more cueing;  poor appetite, taking mostly shakes, FAST 6d. Not walking around as before d/t increasing weakness. Encouraged feeding patient with soft foods like oatmeal, pudding, finger foods; several small meals a day. She has occasional headache which is usually resolved with Tylenol. No pain or distress at this time, per Claudette. Encouraged ongoing supportive care. Patient has appointment with POCP next week.   Mirtazapine 7.5mg  daily can be initiated to boost appetite.  Follow XQ:[id number] care will continue to follow patient for goals of care clarification and symptom management. I spent59minutes providing this consultation; time includes chart review and documentation. More than  50% of the time in this consultation was spent on coordinating communication  HISTORY OF PRESENT ILLNESS:[name] W McCrayis a 85 y.o.year oldfemalewith multiple medical problems including dementia with behavioral disturbances, HTN, OA, Chronic back pain. Palliative Care was asked to help address goals of care. CODE STATUS: Full  PPS: weak 40% HOSPICE ELIGIBILITY/DIAGNOSIS: TBD  PAST MEDICAL HISTORY:  Past Medical History:  Diagnosis Date  . Arthritis   . Dementia (HCC)   . Hypertension     SOCIAL HX:  Social History   Tobacco Use  . Smoking status: Former Smoker    Types: Cigarettes    Quit date: 09/25/1976    Years since quitting: 43.2  . Smokeless tobacco: Never Used  Substance Use Topics  . Alcohol use: No    ALLERGIES: No Known Allergies   PERTINENT MEDICATIONS:  Outpatient Encounter Medications as of 12/31/2019  Medication Sig  . acetaminophen-codeine (TYLENOL #3) 300-30 MG tablet Take 1-2 tablets by mouth every 6 (six) hours as needed for moderate pain.   Marland Kitchen amLODipine (NORVASC) 10 MG tablet Take 1 tablet (10 mg total) by mouth daily.  Marland Kitchen atorvastatin (LIPITOR) 20 MG tablet Take 20 mg by mouth daily at 6 PM.   . carvedilol (COREG) 3.125 MG tablet Take 1 tablet (3.125 mg total) by mouth 2 (two) times daily with a meal.  . Coenzyme Q10 (COQ10) 100 MG CAPS Take 1 tablet by mouth daily.  . cyclobenzaprine (FLEXERIL) 10 MG tablet Take 1 tablet (10 mg total) by mouth 2 (two) times daily as needed for muscle spasms.  Marland Kitchen docusate sodium (COLACE) 100 MG capsule Take 1 capsule (100 mg total) by mouth daily.  . Eliquis DVT/PE Starter Pack (ELIQUIS STARTER PACK) 5 MG TABS Take as directed on package: start with two-5mg   tablets twice daily for 7 days. On day 8, switch to one-5mg  tablet twice daily.  . feeding supplement, ENSURE ENLIVE, (ENSURE ENLIVE) LIQD Take 237 mLs by mouth 2 (two) times daily between meals.  Marland Kitchen ibuprofen (ADVIL) 200 MG tablet Take 1 tablet (200 mg total) by mouth  every 8 (eight) hours as needed.  Marland Kitchen lisinopril (PRINIVIL,ZESTRIL) 5 MG tablet Take 7.5 mg by mouth daily.  . predniSONE (DELTASONE) 20 MG tablet Take 2 tablets (40 mg total) by mouth daily.  . traMADol (ULTRAM) 50 MG tablet Take 50 mg by mouth every 6 (six) hours as needed for pain.   No facility-administered encounter medications on file as of 12/31/2019.    Teodoro Spray, NP

## 2020-01-09 ENCOUNTER — Encounter (HOSPITAL_COMMUNITY): Payer: Self-pay

## 2020-01-09 ENCOUNTER — Other Ambulatory Visit: Payer: Self-pay

## 2020-01-09 ENCOUNTER — Inpatient Hospital Stay (HOSPITAL_COMMUNITY)
Admission: EM | Admit: 2020-01-09 | Discharge: 2020-01-13 | DRG: 378 | Disposition: A | Discharge status: Home Health | Payer: Medicare Other | Attending: Family Medicine | Admitting: Family Medicine

## 2020-01-09 DIAGNOSIS — D62 Acute posthemorrhagic anemia: Secondary | ICD-10-CM | POA: Diagnosis present

## 2020-01-09 DIAGNOSIS — K921 Melena: Principal | ICD-10-CM | POA: Diagnosis present

## 2020-01-09 DIAGNOSIS — I10 Essential (primary) hypertension: Secondary | ICD-10-CM | POA: Diagnosis present

## 2020-01-09 DIAGNOSIS — Z8249 Family history of ischemic heart disease and other diseases of the circulatory system: Secondary | ICD-10-CM

## 2020-01-09 DIAGNOSIS — Z6825 Body mass index (BMI) 25.0-25.9, adult: Secondary | ICD-10-CM

## 2020-01-09 DIAGNOSIS — Z20822 Contact with and (suspected) exposure to covid-19: Secondary | ICD-10-CM | POA: Diagnosis present

## 2020-01-09 DIAGNOSIS — I129 Hypertensive chronic kidney disease with stage 1 through stage 4 chronic kidney disease, or unspecified chronic kidney disease: Secondary | ICD-10-CM | POA: Diagnosis present

## 2020-01-09 DIAGNOSIS — K317 Polyp of stomach and duodenum: Secondary | ICD-10-CM | POA: Diagnosis present

## 2020-01-09 DIAGNOSIS — Z7952 Long term (current) use of systemic steroids: Secondary | ICD-10-CM

## 2020-01-09 DIAGNOSIS — E785 Hyperlipidemia, unspecified: Secondary | ICD-10-CM | POA: Diagnosis present

## 2020-01-09 DIAGNOSIS — Z781 Physical restraint status: Secondary | ICD-10-CM

## 2020-01-09 DIAGNOSIS — R634 Abnormal weight loss: Secondary | ICD-10-CM | POA: Diagnosis present

## 2020-01-09 DIAGNOSIS — Z7901 Long term (current) use of anticoagulants: Secondary | ICD-10-CM

## 2020-01-09 DIAGNOSIS — K922 Gastrointestinal hemorrhage, unspecified: Secondary | ICD-10-CM | POA: Diagnosis not present

## 2020-01-09 DIAGNOSIS — E538 Deficiency of other specified B group vitamins: Secondary | ICD-10-CM | POA: Diagnosis present

## 2020-01-09 DIAGNOSIS — N1832 Chronic kidney disease, stage 3b: Secondary | ICD-10-CM | POA: Diagnosis present

## 2020-01-09 DIAGNOSIS — D649 Anemia, unspecified: Secondary | ICD-10-CM | POA: Diagnosis present

## 2020-01-09 DIAGNOSIS — Z8619 Personal history of other infectious and parasitic diseases: Secondary | ICD-10-CM

## 2020-01-09 DIAGNOSIS — Z79899 Other long term (current) drug therapy: Secondary | ICD-10-CM

## 2020-01-09 DIAGNOSIS — N183 Chronic kidney disease, stage 3 unspecified: Secondary | ICD-10-CM

## 2020-01-09 DIAGNOSIS — I82519 Chronic embolism and thrombosis of unspecified femoral vein: Secondary | ICD-10-CM | POA: Diagnosis present

## 2020-01-09 DIAGNOSIS — F0390 Unspecified dementia without behavioral disturbance: Secondary | ICD-10-CM | POA: Diagnosis present

## 2020-01-09 DIAGNOSIS — Z87891 Personal history of nicotine dependence: Secondary | ICD-10-CM

## 2020-01-09 HISTORY — DX: Acute embolism and thrombosis of unspecified femoral vein: I82.419

## 2020-01-09 LAB — COMPREHENSIVE METABOLIC PANEL
ALT: 8 U/L (ref 0–44)
AST: 16 U/L (ref 15–41)
Albumin: 3.3 g/dL — ABNORMAL LOW (ref 3.5–5.0)
Alkaline Phosphatase: 64 U/L (ref 38–126)
Anion gap: 6 (ref 5–15)
BUN: 37 mg/dL — ABNORMAL HIGH (ref 8–23)
CO2: 24 mmol/L (ref 22–32)
Calcium: 8.7 mg/dL — ABNORMAL LOW (ref 8.9–10.3)
Chloride: 112 mmol/L — ABNORMAL HIGH (ref 98–111)
Creatinine, Ser: 1.85 mg/dL — ABNORMAL HIGH (ref 0.44–1.00)
GFR calc Af Amer: 29 mL/min — ABNORMAL LOW (ref >60)
GFR calc non Af Amer: 25 mL/min — ABNORMAL LOW (ref >60)
Glucose, Bld: 109 mg/dL — ABNORMAL HIGH (ref 70–99)
Potassium: 4.2 mmol/L (ref 3.5–5.1)
Sodium: 142 mmol/L (ref 135–145)
Total Bilirubin: 0.4 mg/dL (ref 0.3–1.2)
Total Protein: 7.1 g/dL (ref 6.5–8.1)

## 2020-01-09 LAB — CBC
HCT: 19.2 % — ABNORMAL LOW (ref 36.0–46.0)
Hemoglobin: 5.6 g/dL — CL (ref 12.0–15.0)
MCH: 27.2 pg (ref 26.0–34.0)
MCHC: 29.2 g/dL — ABNORMAL LOW (ref 30.0–36.0)
MCV: 93.2 fL (ref 80.0–100.0)
Platelets: 340 10*3/uL (ref 150–400)
RBC: 2.06 MIL/uL — ABNORMAL LOW (ref 3.87–5.11)
RDW: 23.2 % — ABNORMAL HIGH (ref 11.5–15.5)
WBC: 6 10*3/uL (ref 4.0–10.5)
nRBC: 0 % (ref 0.0–0.2)

## 2020-01-09 LAB — POC OCCULT BLOOD, ED: Fecal Occult Bld: POSITIVE — AB

## 2020-01-09 LAB — RESPIRATORY PANEL BY RT PCR (FLU A&B, COVID)
Influenza A by PCR: NEGATIVE
Influenza B by PCR: NEGATIVE
SARS Coronavirus 2 by RT PCR: NEGATIVE

## 2020-01-09 LAB — PREPARE RBC (CROSSMATCH)

## 2020-01-09 LAB — ABO/RH: ABO/RH(D): O POS

## 2020-01-09 MED ORDER — SODIUM CHLORIDE 0.9 % IV SOLN
10.0000 mL/h | Freq: Once | INTRAVENOUS | Status: AC
  Administered 2020-01-09: 10 mL/h via INTRAVENOUS

## 2020-01-09 MED ORDER — SODIUM CHLORIDE 0.9 % IV SOLN
8.0000 mg/h | INTRAVENOUS | Status: AC
  Administered 2020-01-10 – 2020-01-12 (×5): 8 mg/h via INTRAVENOUS
  Filled 2020-01-09 (×8): qty 80

## 2020-01-09 MED ORDER — CARVEDILOL 6.25 MG PO TABS
6.2500 mg | ORAL_TABLET | Freq: Two times a day (BID) | ORAL | Status: DC
  Administered 2020-01-10 – 2020-01-12 (×5): 6.25 mg via ORAL
  Filled 2020-01-09 (×6): qty 1

## 2020-01-09 MED ORDER — PANTOPRAZOLE SODIUM 40 MG IV SOLR: 40.0000 mg | Freq: Two times a day (BID) | INTRAVENOUS | Status: DC

## 2020-01-09 MED ORDER — ACETAMINOPHEN 10 MG/ML IV SOLN
1000.0000 mg | Freq: Once | INTRAVENOUS | Status: AC
  Administered 2020-01-09: 1000 mg via INTRAVENOUS
  Filled 2020-01-09: qty 100

## 2020-01-09 MED ORDER — SODIUM CHLORIDE 0.9 % IV SOLN: INTRAVENOUS | Status: DC

## 2020-01-09 MED ORDER — AMLODIPINE BESYLATE 10 MG PO TABS
10.0000 mg | ORAL_TABLET | Freq: Every day | ORAL | Status: DC
  Administered 2020-01-10 – 2020-01-12 (×3): 10 mg via ORAL
  Filled 2020-01-09 (×3): qty 1

## 2020-01-09 MED ORDER — HALOPERIDOL LACTATE 5 MG/ML IJ SOLN
2.0000 mg | Freq: Four times a day (QID) | INTRAMUSCULAR | Status: DC | PRN
  Administered 2020-01-09: 2 mg via INTRAVENOUS
  Filled 2020-01-09: qty 1

## 2020-01-09 MED ORDER — SODIUM CHLORIDE 0.9 % IV SOLN
8.0000 mg/h | INTRAVENOUS | Status: DC
  Administered 2020-01-09: 8 mg/h via INTRAVENOUS
  Filled 2020-01-09: qty 80

## 2020-01-09 MED ORDER — ONDANSETRON HCL 4 MG PO TABS: 4.0000 mg | ORAL_TABLET | Freq: Four times a day (QID) | ORAL | Status: DC | PRN

## 2020-01-09 MED ORDER — SODIUM CHLORIDE 0.9 % IV SOLN
80.0000 mg | Freq: Once | INTRAVENOUS | Status: AC
  Administered 2020-01-09: 80 mg via INTRAVENOUS
  Filled 2020-01-09: qty 80

## 2020-01-09 MED ORDER — ATORVASTATIN CALCIUM 20 MG PO TABS
20.0000 mg | ORAL_TABLET | Freq: Every day | ORAL | Status: DC
  Administered 2020-01-09 – 2020-01-12 (×4): 20 mg via ORAL
  Filled 2020-01-09 (×3): qty 1
  Filled 2020-01-09: qty 2

## 2020-01-09 MED ORDER — DONEPEZIL HCL 10 MG PO TABS
10.0000 mg | ORAL_TABLET | Freq: Every day | ORAL | Status: DC
  Administered 2020-01-11 – 2020-01-12 (×2): 10 mg via ORAL
  Filled 2020-01-09: qty 1
  Filled 2020-01-09: qty 2
  Filled 2020-01-09: qty 1

## 2020-01-09 MED ORDER — TRAMADOL HCL 50 MG PO TABS: 50.0000 mg | ORAL_TABLET | Freq: Two times a day (BID) | ORAL | Status: DC | PRN

## 2020-01-09 MED ORDER — MIRTAZAPINE 15 MG PO TABS
7.5000 mg | ORAL_TABLET | Freq: Every day | ORAL | Status: DC
  Administered 2020-01-09 – 2020-01-12 (×4): 7.5 mg via ORAL
  Filled 2020-01-09 (×5): qty 1

## 2020-01-09 MED ORDER — ONDANSETRON HCL 4 MG/2ML IJ SOLN: 4.0000 mg | Freq: Four times a day (QID) | INTRAMUSCULAR | Status: DC | PRN

## 2020-01-09 MED ORDER — TRAMADOL HCL 50 MG PO TABS: 50.0000 mg | ORAL_TABLET | Freq: Four times a day (QID) | ORAL | Status: DC | PRN

## 2020-01-09 NOTE — ED Notes (Signed)
Blood bank has 2 units of blood ready for this patient, notified Chelsea,RN.

## 2020-01-09 NOTE — H&P (View-Only) (Signed)
Reason for Consult: GI bleed Referring Physician: [facility]  Celisa W Ohalloran HPI: This is an 85 year old female with a PMH of dementia, DVT on Eliquis, and HTN admitted for an HGB of 5.0 g/dL.  The patient was evaluated by her PCP for complaints of dark stools.  She lives with her daughter and she reported melena for the past week.  Also, her daughter mentioned that she complained about abdominal pain.  There were no reports of nausea or vomiting.  As a result of the anemia she was instructed to present to the ER.  Her last colonoscopy with Dr. Collene Mares on 08/10/2004 was normal.  An EGD was performed on 11/17/2005 and she was identified to have H. Pylori and she was treated.  Past Medical History:  Diagnosis Date  . Arthritis   . Dementia ([facility])   . Dvt femoral (deep venous thrombosis) ([facility])   . Hypertension     Past Surgical History:  Procedure Laterality Date  . ABDOMINAL HYSTERECTOMY    . BACK SURGERY      Family History  Problem Relation Age of Onset  . Heart disease Father   . Heart disease Brother     Social History:  reports that she quit smoking about 43 years ago. Her smoking use included cigarettes. She has never used smokeless tobacco. She reports that she does not drink alcohol or use drugs.  Allergies: No Known Allergies  Medications:  Scheduled: . [START ON 01/12/2020] pantoprazole  40 mg Intravenous Q12H   Continuous: . sodium chloride    . pantoprozole (PROTONIX) infusion    . pantoprazole (PROTONIX) IVPB 80 mg (01/09/20 1218)    Results for orders placed or performed during the hospital encounter of 01/09/20 (from the past 24 hour(s))  CBC     Status: Abnormal   Collection Time: 01/09/20 11:44 AM  Result Value Ref Range   WBC 6.0 4.0 - 10.5 K/uL   RBC 2.06 (L) 3.87 - 5.11 MIL/uL   Hemoglobin 5.6 (LL) 12.0 - 15.0 g/dL   HCT 19.2 (L) 36.0 - 46.0 %   MCV 93.2 80.0 - 100.0 fL   MCH 27.2 26.0 - 34.0 pg   MCHC 29.2 (L) 30.0 - 36.0 g/dL   RDW 23.2 (H) 11.5  - 15.5 %   Platelets 340 150 - 400 K/uL   nRBC 0.0 0.0 - 0.2 %  Type and screen [place]     Status: None (Preliminary result)   Collection Time: 01/09/20 11:45 AM  Result Value Ref Range   ABO/RH(D) PENDING    Antibody Screen PENDING    Sample Expiration      01/12/2020,2359 Performed at [facility], [place] [address] [place], [place] [address]   POC occult blood, ED     Status: Abnormal   Collection Time: 01/09/20 11:54 AM  Result Value Ref Range   Fecal Occult Bld POSITIVE (A) NEGATIVE  Prepare RBC (crossmatch)     Status: None   Collection Time: 01/09/20 12:12 PM  Result Value Ref Range   Order Confirmation      ORDER PROCESSED BY BLOOD BANK Performed at [facility] [address], [place], [place] [address]      No results found.  ROS:  As stated above in the HPI otherwise negative.  Blood pressure (!) 120/52, pulse (!) 46, temperature 97.6 F (36.4 C), temperature source Oral, resp. rate 15, height 5' (1.524 m), weight 59.9 kg, SpO2 100 %.  PE: Gen: NAD, Alert and Oriented HEENT:  [place]/AT, EOMI Neck: Supple, no LAD Lungs: CTA Bilaterally CV: RRR without M/G/R ABM: Soft, NTND, +BS Ext: No C/C/E  Assessment/Plan: 1) Melena. 2) Anemia. 3) DVT on Eliquis. 4) Dementia. 5) History of H. Pylori.   With her current clinical presentation an EGD will be scheduled for tomorrow.  Plan: 1) Agree with blood transfusion. 2) EGD tomorrow AM.  Jeani Hawking D 01/09/2020, 12:29 PM

## 2020-01-09 NOTE — ED Triage Notes (Signed)
Patient's daughter reports that the patient's physician called her and stated that her Hgb was 5.0. Patient's daughter denies any visible signs of bleeding. Patient was on Eliquis ,but med was stopped yesterday.

## 2020-01-09 NOTE — ED Provider Notes (Signed)
[facility]-EMERGENCY DEPT Provider Note   CSN: [id number] Arrival date & time: 01/09/20  1057     History Chief Complaint  Patient presents with  . Abnormal Lab    [name] [name] is a 85 y.o. female with history of DVT on Eliquis and dementia. Her daughter [name] is at bedside and helps with history.  She states that the patient lives with her and her sister.  About 1 week ago on Thursday she noticed the patient was having black stool.  She took a sample of it and brought it to her doctor but is unsure of the results.  She took her for a checkup to her doctor yesterday and they drew blood.  They were called today and told that they needed to come to the emergency department for a blood transfusion because her hemoglobin was 5.  Patient has been very fatigued.  She has had unintentional weight loss of about 25 pounds over the past 2 months because she has not had an appetite despite them trying to give her Ensure every day.  She complains of chronic abdominal pain.  She does not take any NSAIDs or drink alcohol.  She does not take a PPI.  No history of GI bleed.  She had a colonoscopy about 8 years ago by Dr. Loreta Ave.  HPI     Past Medical History:  Diagnosis Date  . Arthritis   . Dementia (HCC)   . Dvt femoral (deep venous thrombosis) (HCC)   . Hypertension     Patient Active Problem List   Diagnosis Date Noted  . DVT (deep venous thrombosis) (HCC) 01/28/2019  . Abdominal pain 01/28/2019  . AKI (acute kidney injury) (HCC) 01/28/2019  . Chronic anemia 01/28/2019  . B12 deficiency 01/28/2019  . DVT, lower extremity (HCC) 01/28/2019  . Memory loss 07/02/2018  . Insomnia 07/02/2018  . Palliative care encounter 07/02/2018  . Hypertensive urgency   . Chest pain 11/23/2017  . Elevated troponin 11/23/2017  . DVT, femoral, chronic (HCC) 11/23/2017  . Acute on chronic renal insufficiency 11/23/2017  . ARF (acute renal failure) (HCC) 09/13/2017  . Dementia (HCC)  09/13/2017  . Essential hypertension 09/13/2017  . Arthritis 09/13/2017  . Chronic back pain 09/13/2017  . Confusion 06/05/2012    Past Surgical History:  Procedure Laterality Date  . ABDOMINAL HYSTERECTOMY    . BACK SURGERY       OB History   No obstetric history on file.     Family History  Problem Relation Age of Onset  . Heart disease Father   . Heart disease Brother     Social History   Tobacco Use  . Smoking status: Former Smoker    Types: Cigarettes    Quit date: 09/25/1976    Years since quitting: 43.3  . Smokeless tobacco: Never Used  Substance Use Topics  . Alcohol use: No  . Drug use: No    Home Medications Prior to Admission medications   Medication Sig Start Date End Date Taking? Authorizing Provider  acetaminophen-codeine (TYLENOL #3) 300-30 MG tablet Take 1-2 tablets by mouth every 6 (six) hours as needed for moderate pain.     [provider]  amLODipine (NORVASC) 10 MG tablet Take 1 tablet (10 mg total) by mouth daily. 11/28/17   Joseph Art, DO  atorvastatin (LIPITOR) 20 MG tablet Take 20 mg by mouth daily at 6 PM.     [provider]  carvedilol (COREG) 3.125 MG tablet  Take 1 tablet (3.125 mg total) by mouth 2 (two) times daily with a meal. 11/27/17   Joseph Art, DO  Coenzyme Q10 (COQ10) 100 MG CAPS Take 1 tablet by mouth daily.    [provider]  cyclobenzaprine (FLEXERIL) 10 MG tablet Take 1 tablet (10 mg total) by mouth 2 (two) times daily as needed for muscle spasms. 11/23/18   Liberty Handy, PA-C  docusate sodium (COLACE) 100 MG capsule Take 1 capsule (100 mg total) by mouth daily. 11/28/17   Joseph Art, DO  Eliquis DVT/PE Starter Pack (ELIQUIS STARTER PACK) 5 MG TABS Take as directed on package: start with two-5mg  tablets twice daily for 7 days. On day 8, switch to one-5mg  tablet twice daily. 01/29/19   Pokhrel, Rebekah Chesterfield, MD  feeding supplement, ENSURE ENLIVE, (ENSURE ENLIVE) LIQD Take 237 mLs by mouth 2 (two)  times daily between meals. 09/15/17   Noralee Stain, DO  ibuprofen (ADVIL) 200 MG tablet Take 1 tablet (200 mg total) by mouth every 8 (eight) hours as needed. 06/08/19   Raeford Razor, MD  lisinopril (PRINIVIL,ZESTRIL) 5 MG tablet Take 7.5 mg by mouth daily.    [provider]  predniSONE (DELTASONE) 20 MG tablet Take 2 tablets (40 mg total) by mouth daily. 06/08/19   Raeford Razor, MD  traMADol (ULTRAM) 50 MG tablet Take 50 mg by mouth every 6 (six) hours as needed for pain. 10/28/17   [provider]    Allergies    Patient has no known allergies.  Review of Systems   Review of Systems  Constitutional: Positive for fatigue and unexpected weight change. Negative for chills and fever.  Respiratory: Negative for shortness of breath.   Cardiovascular: Negative for chest pain.  Gastrointestinal: Positive for abdominal pain (chronic) and blood in stool. Negative for constipation, diarrhea, nausea and vomiting.  Neurological: Negative for syncope and light-headedness.  All other systems reviewed and are negative.   Physical Exam Updated Vital Signs BP 124/73 (BP Location: Left Arm)   Pulse (!) 54   Temp 97.6 F (36.4 C) (Oral)   Resp 16   Ht 5' (1.524 m)   Wt 59.9 kg   SpO2 100%   BMI 25.78 kg/m   Physical Exam Vitals and nursing note reviewed.  Constitutional:      General: She is not in acute distress.    Appearance: Normal appearance. She is well-developed. She is not ill-appearing.     Comments: Elderly female in NAD. Confused  HENT:     Head: Normocephalic and atraumatic.  Eyes:     General: No scleral icterus.       Right eye: No discharge.        Left eye: No discharge.     Conjunctiva/sclera: Conjunctivae normal.     Pupils: Pupils are equal, round, and reactive to light.  Cardiovascular:     Rate and Rhythm: Regular rhythm. Bradycardia present.  Pulmonary:     Effort: Pulmonary effort is normal. No respiratory distress.     Breath sounds:  Normal breath sounds.  Abdominal:     General: There is no distension.     Palpations: Abdomen is soft.     Comments: Large well healed surgical scar  Genitourinary:    Comments: Rectal: Hard stool in the distal rectal vault. No hemorrhoids, fissures, tenderness. Scant amount of dark blood noted with digital exam. Musculoskeletal:     Cervical back: Normal range of motion.  Skin:    General: Skin  is warm and dry.  Neurological:     Mental Status: She is alert and oriented to person, place, and time.  Psychiatric:        Behavior: Behavior normal.     ED Results / Procedures / Treatments   Labs (all labs ordered are listed, but only abnormal results are displayed) Labs Reviewed  COMPREHENSIVE METABOLIC PANEL - Abnormal; Notable for the following components:      Result Value   Chloride 112 (*)    Glucose, Bld 109 (*)    BUN 37 (*)    Creatinine, Ser 1.85 (*)    Calcium 8.7 (*)    Albumin 3.3 (*)    GFR calc non Af Amer 25 (*)    GFR calc Af Amer 29 (*)    All other components within normal limits  CBC - Abnormal; Notable for the following components:   RBC 2.06 (*)    Hemoglobin 5.6 (*)    HCT 19.2 (*)    MCHC 29.2 (*)    RDW 23.2 (*)    All other components within normal limits  POC OCCULT BLOOD, ED - Abnormal; Notable for the following components:   Fecal Occult Bld POSITIVE (*)    All other components within normal limits  RESPIRATORY PANEL BY RT PCR (FLU A&B, COVID)  POC OCCULT BLOOD, ED  TYPE AND SCREEN  PREPARE RBC (CROSSMATCH)  ABO/RH    EKG None  Radiology No results found.  Procedures Procedures (including critical care time)  CRITICAL CARE Performed by: Bethel Born   Total critical care time: 35 minutes  Critical care time was exclusive of separately billable procedures and treating other patients.  Critical care was necessary to treat or prevent imminent or life-threatening deterioration.  Critical care was time spent personally by  me on the following activities: development of treatment plan with patient and/or surrogate as well as nursing, discussions with consultants, evaluation of patient's response to treatment, examination of patient, obtaining history from patient or surrogate, ordering and performing treatments and interventions, ordering and review of laboratory studies, ordering and review of radiographic studies, pulse oximetry and re-evaluation of patient's condition.   Medications Ordered in ED Medications  pantoprazole (PROTONIX) 80 mg in sodium chloride 0.9 % 100 mL (0.8 mg/mL) infusion (has no administration in time range)  pantoprazole (PROTONIX) injection 40 mg (has no administration in time range)  0.9 %  sodium chloride infusion (has no administration in time range)  pantoprazole (PROTONIX) 80 mg in sodium chloride 0.9 % 100 mL IVPB (80 mg Intravenous New Bag/Given 01/09/20 1218)    ED Course  I have reviewed the triage vital signs and the nursing notes.  Pertinent labs & imaging results that were available during my care of the patient were reviewed by me and considered in my medical decision making (see chart for details).  85 year old female presents with concern for upper GI bleed and significant anemia with outpatient labs. Patient has been having black stools for 1 week and hemoglobin at her PCPs office was five. On exam she is pleasantly confused. She is bradycardic but otherwise vital signs are reassuring. Abdomen is soft and nontender. Her rectal exam is remarkable for dark red blood and hard stool in the rectal vault.   CBC is remarkable for hemoglobin of 5.6. CMP is remarkable for elevated BUN and creatinine appears slightly worse than baseline as well. 2 units of blood ordered. Will discuss with GI. Shared visit with Dr. Lockie Mola  12:17  PM Discussed with Dr. Collene Mares - she recommends transfusion and Dr. Benson Norway will come to see her today.  Discussed with Dr. Hollice Gong who will admit.  MDM  Rules/Calculators/A&P                       Final Clinical Impression(s) / ED Diagnoses Final diagnoses:  Upper GI bleed  Anemia, unspecified type    Rx / DC Orders ED Discharge Orders    None       Recardo Evangelist, PA-C 01/09/20 [place], [place], DO 01/09/20 1406

## 2020-01-09 NOTE — Consult Note (Signed)
Reason for Consult: GI bleed Referring Physician: [facility]  [name] [name] HPI: This is an 85 year old female with a PMH of dementia, DVT on Eliquis, and HTN admitted for an HGB of 5.0 g/dL.  The patient was evaluated by her PCP for complaints of dark stools.  She lives with her daughter and she reported melena for the past week.  Also, her daughter mentioned that she complained about abdominal pain.  There were no reports of nausea or vomiting.  As a result of the anemia she was instructed to present to the ER.  Her last colonoscopy with Dr. Mann on 08/10/2004 was normal.  An EGD was performed on 11/17/2005 and she was identified to have H. Pylori and she was treated.  Past Medical History:  Diagnosis Date  . Arthritis   . Dementia (HCC)   . Dvt femoral (deep venous thrombosis) (HCC)   . Hypertension     Past Surgical History:  Procedure Laterality Date  . ABDOMINAL HYSTERECTOMY    . BACK SURGERY      Family History  Problem Relation Age of Onset  . Heart disease Father   . Heart disease Brother     Social History:  reports that she quit smoking about 43 years ago. Her smoking use included cigarettes. She has never used smokeless tobacco. She reports that she does not drink alcohol or use drugs.  Allergies: No Known Allergies  Medications:  Scheduled: . [START ON 01/12/2020] pantoprazole  40 mg Intravenous Q12H   Continuous: . sodium chloride    . pantoprozole (PROTONIX) infusion    . pantoprazole (PROTONIX) IVPB 80 mg (01/09/20 1218)    Results for orders placed or performed during the hospital encounter of 01/09/20 (from the past 24 hour(s))  CBC     Status: Abnormal   Collection Time: 01/09/20 11:44 AM  Result Value Ref Range   WBC 6.0 4.0 - 10.5 K/uL   RBC 2.06 (L) 3.87 - 5.11 MIL/uL   Hemoglobin 5.6 (LL) 12.0 - 15.0 g/dL   HCT 19.2 (L) 36.0 - 46.0 %   MCV 93.2 80.0 - 100.0 fL   MCH 27.2 26.0 - 34.0 pg   MCHC 29.2 (L) 30.0 - 36.0 g/dL   RDW 23.2 (H) 11.5  - 15.5 %   Platelets 340 150 - 400 K/uL   nRBC 0.0 0.0 - 0.2 %  Type and screen [facility]     Status: None (Preliminary result)   Collection Time: 01/09/20 11:45 AM  Result Value Ref Range   ABO/RH(D) PENDING    Antibody Screen PENDING    Sample Expiration      01/12/2020,2359 Performed at [facility], [address] [place], [place] [address]   POC occult blood, ED     Status: Abnormal   Collection Time: 01/09/20 11:54 AM  Result Value Ref Range   Fecal Occult Bld POSITIVE (A) NEGATIVE  Prepare RBC (crossmatch)     Status: None   Collection Time: 01/09/20 12:12 PM  Result Value Ref Range   Order Confirmation      ORDER PROCESSED BY BLOOD BANK Performed at [facility], [address] [place], [place] [address]      No results found.  ROS:  As stated above in the HPI otherwise negative.  Blood pressure (!) 120/52, pulse (!) 46, temperature 97.6 F (36.4 C), temperature source Oral, resp. rate 15, height 5' (1.524 m), weight 59.9 kg, SpO2 100 %.      PE: Gen: NAD, Alert and Oriented HEENT:  Espy/AT, EOMI Neck: Supple, no LAD Lungs: CTA Bilaterally CV: RRR without M/G/R ABM: Soft, NTND, +BS Ext: No C/C/E  Assessment/Plan: 1) Melena. 2) Anemia. 3) DVT on Eliquis. 4) Dementia. 5) History of H. Pylori.   With her current clinical presentation an EGD will be scheduled for tomorrow.  Plan: 1) Agree with blood transfusion. 2) EGD tomorrow AM.  Jeani Hawking D 01/09/2020, 12:29 PM

## 2020-01-09 NOTE — H&P (Signed)
History and Physical  [name] [name] [id number]:[id number] DOB: 1935-01-25 DOA: 01/09/2020   PCP: Andi Devon, MD   Patient coming from: Home   Chief Complaint: Dark stools, abd pain   HPI: [name] [name] is a 85 y.o. female with medical history significant for history of DVT on Eliquis, dementia, hypertension be admitted to the hospital with a suspected upper GI bleed.  History is provided by the patient's daughter as the patient is severely demented and unable to give a history.  Daughter with whom the patient lives, says that for the last 9 days, she has noticed dark stools, for about the last week her mother has also complained of abdominal discomfort in the lower abdomen.  She has also had decreased appetite.  She has not had any bright red blood per rectum, no nausea or vomiting, she has no prior history of bleeding.  She has been on Eliquis for about 3 years in total, and is now on lifelong Eliquis due to multiple DVTs.  She came to the emergency department today after lab work with her PCP yesterday demonstrated hemoglobin of 5.  Note that patient is nonambulatory.  ED Course: In the emergency department, rectal examination revealed some hematochezia, and she had hemoglobin 5.7.  Orders have been placed for 2 units of blood to be transfused, case was discussed with gastroenterology who has seen the patient in the ER and is planning EGD in the morning.  Review of Systems: Please see HPI for pertinent positives and negatives. A complete 10 system review of systems are otherwise negative.  Past Medical History:  Diagnosis Date  . Arthritis   . Dementia (HCC)   . Dvt femoral (deep venous thrombosis) (HCC)   . Hypertension    Past Surgical History:  Procedure Laterality Date  . ABDOMINAL HYSTERECTOMY    . BACK SURGERY      Social History:  reports that she quit smoking about 43 years ago. Her smoking use included cigarettes. She has never used smokeless tobacco. She reports that she  does not drink alcohol or use drugs.   No Known Allergies  Family History  Problem Relation Age of Onset  . Heart disease Father   . Heart disease Brother      Prior to Admission medications   Medication Sig Start Date End Date Taking? Authorizing Provider  acetaminophen-codeine (TYLENOL #3) 300-30 MG tablet Take 1-2 tablets by mouth every 6 (six) hours as needed for moderate pain.     [provider]  amLODipine (NORVASC) 10 MG tablet Take 1 tablet (10 mg total) by mouth daily. 11/28/17   Joseph Art, DO  atorvastatin (LIPITOR) 20 MG tablet Take 20 mg by mouth daily at 6 PM.     [provider]  carvedilol (COREG) 3.125 MG tablet Take 1 tablet (3.125 mg total) by mouth 2 (two) times daily with a meal. 11/27/17   Marlin Canary U, DO  carvedilol (COREG) 6.25 MG tablet Take 6.25 mg by mouth 2 (two) times daily. 10/29/19   [provider]  Coenzyme Q10 (COQ10) 100 MG CAPS Take 1 tablet by mouth daily.    [provider]  cyclobenzaprine (FLEXERIL) 10 MG tablet Take 1 tablet (10 mg total) by mouth 2 (two) times daily as needed for muscle spasms. 11/23/18   Liberty Handy, PA-C  docusate sodium (COLACE) 100 MG capsule Take 1 capsule (100 mg total) by mouth daily. 11/28/17   Joseph Art, DO  donepezil (ARICEPT) 10  MG tablet Take 10 mg by mouth daily. 12/30/19   [provider]  Eliquis DVT/PE Starter Pack (ELIQUIS STARTER PACK) 5 MG TABS Take as directed on package: start with two-5mg  tablets twice daily for 7 days. On day 8, switch to one-5mg  tablet twice daily. 01/29/19   Pokhrel, Corrie Mckusick, MD  feeding supplement, ENSURE ENLIVE, (ENSURE ENLIVE) LIQD Take 237 mLs by mouth 2 (two) times daily between meals. 09/15/17   Dessa Phi, DO  ibuprofen (ADVIL) 200 MG tablet Take 1 tablet (200 mg total) by mouth every 8 (eight) hours as needed. 06/08/19   Virgel Manifold, MD  lisinopril (PRINIVIL,ZESTRIL) 5 MG tablet Take 7.5 mg by mouth daily.    [provider]  mirtazapine (REMERON) 7.5 MG tablet Take 7.5 mg by mouth at bedtime. 12/06/19   [provider]  predniSONE (DELTASONE) 20 MG tablet Take 2 tablets (40 mg total) by mouth daily. 06/08/19   Virgel Manifold, MD  traMADol (ULTRAM) 50 MG tablet Take 50 mg by mouth every 6 (six) hours as needed for pain. 10/28/17   [provider]    Physical Exam: BP (!) 120/52   Pulse (!) 46   Temp 97.6 F (36.4 C) (Oral)   Resp 15   Ht 5' (1.524 m)   Wt 59.9 kg   SpO2 100%   BMI 25.78 kg/m   General:  Alert, pleasantly demented, in no acute distress Eyes: EOMI, clear conjuctivae, white sclerea Neck: supple, no masses, trachea mildline  Cardiovascular: RRR, no murmurs or rubs, no peripheral edema  Respiratory: clear to auscultation bilaterally, no wheezes, no crackles  Abdomen: soft, nontender, nondistended, normal bowel tones heard  Skin: dry, no rashes  Musculoskeletal: no joint effusions, normal range of motion  Psychiatric: pleasantly demented, nonverbal Neurologic: extraocular muscles intact, clear speech, moving all extremities with intact sensorium            Labs on Admission:  Basic Metabolic Panel: Recent Labs  Lab 01/09/20 1144  NA 142  K 4.2  CL 112*  CO2 24  GLUCOSE 109*  BUN 37*  CREATININE 1.85*  CALCIUM 8.7*   Liver Function Tests: Recent Labs  Lab 01/09/20 1144  AST 16  ALT 8  ALKPHOS 64  BILITOT 0.4  PROT 7.1  ALBUMIN 3.3*   No results for input(s): LIPASE, AMYLASE in the last 168 hours. No results for input(s): AMMONIA in the last 168 hours. CBC: Recent Labs  Lab 01/09/20 1144  WBC 6.0  HGB 5.6*  HCT 19.2*  MCV 93.2  PLT 340   Cardiac Enzymes: No results for input(s): CKTOTAL, CKMB, CKMBINDEX, TROPONINI in the last 168 hours.  BNP (last 3 results) Recent Labs    01/28/19 0037  BNP 71.8    ProBNP (last 3 results) No results for input(s): PROBNP in the last 8760 hours.  CBG: No results for input(s): GLUCAP in  the last 168 hours.  Radiological Exams on Admission: No results found.  Assessment/Plan Present on Admission: . Upper GI bleed . B12 deficiency . Dementia ([place]) . Essential hypertension . DVT, femoral, chronic ([place]) . Chronic anemia  Principal Problem:   Upper GI bleed - with melena, abd pain and acute blood loss anemia -Observation admission -N.p.o. status -IV PPI given in ER -GI consultation Active Problems:   Dementia ([place]) -continue Aricept, Remeron   Essential hypertension -continue Coreg, Norvasc   DVT, femoral, chronic (HCC) -on chronic Eliquis, will hold due to suspected upper GI bleed Acute blood loss  anemia on top of chronic anemia -transfused 2 units, RN to place appropriate posttransfusion hematocrit   B12 deficiency   CKD (chronic kidney disease) stage 3, GFR 30-59 ml/min -renal function appears to be at baseline   DVT prophylaxis: SCDs  Code Status: Full code-confirmed with daughter at the bedside in the ER this afternoon  Family Communication: Discussed with daughter in the ER this afternoon.  Disposition Plan: Likely home with family at discharge.  Consults called: ER provider discussed with gastroenterology who has seen the patient in the emergency department.  Admission status: Obs   Time spent: 41 minutes  Aviyon Hocevar Vergie Living MD [facility] Pager [phone]  If 7PM-7AM, please contact night-coverage [email or web address] Password [email or web address]  01/09/2020, 1:22 PM

## 2020-01-09 NOTE — Progress Notes (Signed)
Pt pulled IV out while blood was running. Pt agitated and fighting. MD notified, orders given.

## 2020-01-09 NOTE — Progress Notes (Signed)
Daughter [name] has permission to stay the night if needed.

## 2020-01-09 NOTE — ED Provider Notes (Signed)
Medical screening examination/treatment/procedure(s) were conducted as a shared visit with non-physician practitioner(s) and myself.  I personally evaluated the patient during the encounter. Briefly, the patient is a 85 y.o. female with history of DVT on Eliquis who presents to the ED with hemoglobin of 5.  Patient with unremarkable vitals otherwise.  Has had dark stools for the last several days.  Was seen by her primary care physician yesterday and they obtain blood work that showed a hemoglobin of 5.  Patient is fairly nonambulatory and thus does not complain of much shortness of breath or chest pain.  No abdominal pain.  Repeat hemoglobin here is 5.7.  Hemoccult is positive.  Will transfuse.  GI has been consulted and patient to be admitted to hospitalist for further care.  Suspect upper GI bleed.  This chart was dictated using voice recognition software.  Despite best efforts to proofread,  errors can occur which can change the documentation meaning.     EKG Interpretation None           Virgina Norfolk, DO 01/09/20 1219

## 2020-01-10 ENCOUNTER — Encounter (HOSPITAL_COMMUNITY)
Admission: EM | Disposition: A | Discharge status: Home Health | Payer: Self-pay | Source: Home / Self Care | Attending: Family Medicine

## 2020-01-10 ENCOUNTER — Observation Stay (HOSPITAL_COMMUNITY): Payer: Medicare Other | Admitting: Certified Registered Nurse Anesthetist

## 2020-01-10 ENCOUNTER — Encounter (HOSPITAL_COMMUNITY): Payer: Self-pay | Admitting: Internal Medicine

## 2020-01-10 DIAGNOSIS — Z20822 Contact with and (suspected) exposure to covid-19: Secondary | ICD-10-CM | POA: Diagnosis present

## 2020-01-10 DIAGNOSIS — R634 Abnormal weight loss: Secondary | ICD-10-CM | POA: Diagnosis present

## 2020-01-10 DIAGNOSIS — Z8619 Personal history of other infectious and parasitic diseases: Secondary | ICD-10-CM | POA: Diagnosis not present

## 2020-01-10 DIAGNOSIS — I129 Hypertensive chronic kidney disease with stage 1 through stage 4 chronic kidney disease, or unspecified chronic kidney disease: Secondary | ICD-10-CM | POA: Diagnosis present

## 2020-01-10 DIAGNOSIS — Z6825 Body mass index (BMI) 25.0-25.9, adult: Secondary | ICD-10-CM | POA: Diagnosis not present

## 2020-01-10 DIAGNOSIS — Z7952 Long term (current) use of systemic steroids: Secondary | ICD-10-CM | POA: Diagnosis not present

## 2020-01-10 DIAGNOSIS — D649 Anemia, unspecified: Secondary | ICD-10-CM

## 2020-01-10 DIAGNOSIS — F0391 Unspecified dementia with behavioral disturbance: Secondary | ICD-10-CM | POA: Diagnosis not present

## 2020-01-10 DIAGNOSIS — Z8249 Family history of ischemic heart disease and other diseases of the circulatory system: Secondary | ICD-10-CM | POA: Diagnosis not present

## 2020-01-10 DIAGNOSIS — E785 Hyperlipidemia, unspecified: Secondary | ICD-10-CM | POA: Diagnosis present

## 2020-01-10 DIAGNOSIS — Z87891 Personal history of nicotine dependence: Secondary | ICD-10-CM | POA: Diagnosis not present

## 2020-01-10 DIAGNOSIS — N1832 Chronic kidney disease, stage 3b: Secondary | ICD-10-CM | POA: Diagnosis present

## 2020-01-10 DIAGNOSIS — K317 Polyp of stomach and duodenum: Secondary | ICD-10-CM | POA: Diagnosis present

## 2020-01-10 DIAGNOSIS — E538 Deficiency of other specified B group vitamins: Secondary | ICD-10-CM | POA: Diagnosis present

## 2020-01-10 DIAGNOSIS — I10 Essential (primary) hypertension: Secondary | ICD-10-CM | POA: Diagnosis not present

## 2020-01-10 DIAGNOSIS — Z781 Physical restraint status: Secondary | ICD-10-CM | POA: Diagnosis not present

## 2020-01-10 DIAGNOSIS — Z7901 Long term (current) use of anticoagulants: Secondary | ICD-10-CM | POA: Diagnosis not present

## 2020-01-10 DIAGNOSIS — Z79899 Other long term (current) drug therapy: Secondary | ICD-10-CM | POA: Diagnosis not present

## 2020-01-10 DIAGNOSIS — I82519 Chronic embolism and thrombosis of unspecified femoral vein: Secondary | ICD-10-CM | POA: Diagnosis present

## 2020-01-10 DIAGNOSIS — K921 Melena: Secondary | ICD-10-CM | POA: Diagnosis present

## 2020-01-10 DIAGNOSIS — F0390 Unspecified dementia without behavioral disturbance: Secondary | ICD-10-CM | POA: Diagnosis present

## 2020-01-10 DIAGNOSIS — K922 Gastrointestinal hemorrhage, unspecified: Secondary | ICD-10-CM | POA: Diagnosis present

## 2020-01-10 DIAGNOSIS — D62 Acute posthemorrhagic anemia: Secondary | ICD-10-CM | POA: Diagnosis present

## 2020-01-10 HISTORY — PX: BIOPSY: SHX5522

## 2020-01-10 HISTORY — PX: ESOPHAGOGASTRODUODENOSCOPY (EGD) WITH PROPOFOL: SHX5813

## 2020-01-10 LAB — BASIC METABOLIC PANEL
Anion gap: 5 (ref 5–15)
BUN: 26 mg/dL — ABNORMAL HIGH (ref 8–23)
CO2: 23 mmol/L (ref 22–32)
Calcium: 8.2 mg/dL — ABNORMAL LOW (ref 8.9–10.3)
Chloride: 119 mmol/L — ABNORMAL HIGH (ref 98–111)
Creatinine, Ser: 1.5 mg/dL — ABNORMAL HIGH (ref 0.44–1.00)
GFR calc Af Amer: 37 mL/min — ABNORMAL LOW (ref >60)
GFR calc non Af Amer: 32 mL/min — ABNORMAL LOW (ref >60)
Glucose, Bld: 89 mg/dL (ref 70–99)
Potassium: 4.1 mmol/L (ref 3.5–5.1)
Sodium: 147 mmol/L — ABNORMAL HIGH (ref 135–145)

## 2020-01-10 LAB — CBC
HCT: 23.2 % — ABNORMAL LOW (ref 36.0–46.0)
Hemoglobin: 7.2 g/dL — ABNORMAL LOW (ref 12.0–15.0)
MCH: 29 pg (ref 26.0–34.0)
MCHC: 31 g/dL (ref 30.0–36.0)
MCV: 93.5 fL (ref 80.0–100.0)
Platelets: 274 10*3/uL (ref 150–400)
RBC: 2.48 MIL/uL — ABNORMAL LOW (ref 3.87–5.11)
RDW: 19.2 % — ABNORMAL HIGH (ref 11.5–15.5)
WBC: 7.7 10*3/uL (ref 4.0–10.5)
nRBC: 0 % (ref 0.0–0.2)

## 2020-01-10 SURGERY — ESOPHAGOGASTRODUODENOSCOPY (EGD) WITH PROPOFOL
Anesthesia: Monitor Anesthesia Care

## 2020-01-10 MED ORDER — PROPOFOL 500 MG/50ML IV EMUL
INTRAVENOUS | Status: DC | PRN
  Administered 2020-01-10: 25 mg via INTRAVENOUS

## 2020-01-10 MED ORDER — PROPOFOL 500 MG/50ML IV EMUL
INTRAVENOUS | Status: DC | PRN
  Administered 2020-01-10: 75 ug/kg/min via INTRAVENOUS

## 2020-01-10 MED ORDER — PROPOFOL 10 MG/ML IV BOLUS
INTRAVENOUS | Status: AC
  Filled 2020-01-10: qty 40

## 2020-01-10 MED ORDER — LACTATED RINGERS IV SOLN: INTRAVENOUS | Status: DC | PRN

## 2020-01-10 SURGICAL SUPPLY — 15 items

## 2020-01-10 NOTE — Anesthesia Postprocedure Evaluation (Signed)
Anesthesia Post Note  Patient: [name] [name]  Procedure(s) Performed: ESOPHAGOGASTRODUODENOSCOPY (EGD) WITH PROPOFOL (N/A ) BIOPSY     Patient location during evaluation: PACU Anesthesia Type: MAC Level of consciousness: awake and alert Pain management: pain level controlled Vital Signs Assessment: post-procedure vital signs reviewed and stable Respiratory status: spontaneous breathing, nonlabored ventilation, respiratory function stable and patient connected to nasal cannula oxygen Cardiovascular status: stable and blood pressure returned to baseline Postop Assessment: no apparent nausea or vomiting Anesthetic complications: no    Last Vitals:  Vitals:   01/10/20 1200 01/10/20 1210  BP: (!) 117/39 (!) 121/47  Pulse: (!) 49 (!) 48  Resp: (!) 22 (!) 21  Temp: 37.1 C   SpO2: 99% 100%    Last Pain:  Vitals:   01/10/20 1200  TempSrc: Axillary  PainSc:                  Tiajuana Amass

## 2020-01-10 NOTE — Plan of Care (Signed)
CHG given to prep for OR

## 2020-01-10 NOTE — Transfer of Care (Signed)
Immediate Anesthesia Transfer of Care Note  Patient: [name] [name]  Procedure(s) Performed: ESOPHAGOGASTRODUODENOSCOPY (EGD) WITH PROPOFOL (N/A ) BIOPSY  Patient Location: PACU  Anesthesia Type:MAC  Level of Consciousness: sedated, confused and responds to stimulation  Airway & Oxygen Therapy: Patient Spontanous Breathing and Patient connected to face mask oxygen  Post-op Assessment: Report given to RN and Post -op Vital signs reviewed and stable  Post vital signs: Reviewed and stable  Last Vitals:  Vitals Value Taken Time  BP    Temp    Pulse 54 01/10/20 1154  Resp 11 01/10/20 1154  SpO2 100 % 01/10/20 1154  Vitals shown include unvalidated device data.  Last Pain:  Vitals:   01/10/20 1120  TempSrc: Axillary  PainSc: 0-No pain         Complications: No apparent anesthesia complications

## 2020-01-10 NOTE — Op Note (Signed)
[facility] Patient Name: [name] [name] Procedure Date: 01/10/2020 MRN: [id number] Attending MD: Jeani Hawking , MD Date of Birth: [birth year] CSN: [id number] Age: 85 Admit Type: Inpatient Procedure:                Small bowel enteroscopy Indications:              Melena Providers:                Jeani Hawking, MD, Glory Rosebush, RN, Kandice Robinsons, Technician Referring MD:              Medicines:                Propofol per Anesthesia Complications:            No immediate complications. Estimated Blood Loss:     Estimated blood loss was minimal. Procedure:                Pre-Anesthesia Assessment:                           - Prior to the procedure, a History and Physical                            was performed, and patient medications and                            allergies were reviewed. The patient's tolerance of                            previous anesthesia was also reviewed. The risks                            and benefits of the procedure and the sedation                            options and risks were discussed with the patient.                            All questions were answered, and informed consent                            was obtained. Prior Anticoagulants: The patient has                            taken Eliquis (apixaban), last dose was 3 days                            prior to procedure. ASA Grade Assessment: III - A                            patient with severe systemic disease. After  reviewing the risks and benefits, the patient was                            deemed in satisfactory condition to undergo the                            procedure.                           - Sedation was administered by an anesthesia                            professional. Deep sedation was attained.                           After obtaining informed consent, the endoscope was   passed under direct vision. Throughout the                            procedure, the patient's blood pressure, pulse, and                            oxygen saturations were monitored continuously. The                            [id number]-H190 ([id number] Olympus gastroscope was                            introduced through the mouth, and advanced to the                            small bowel proximal to the Ligament of Treitz.                            After obtaining informed consent, the endoscope was                            passed under direct vision. Throughout the                            procedure, the patient's blood pressure, pulse, and                            oxygen saturations were monitored continuously.The                            small bowel enteroscopy was accomplished without                            difficulty. The patient tolerated the procedure                            well. Scope In: Scope Out: Findings:      The esophagus was normal.      A single large sessile polyp with no bleeding and  no stigmata of recent       bleeding was found in the gastric antrum. Biopsies were taken with a       cold forceps for histology.      There was no evidence of significant pathology in the entire examined       duodenum.      There was no evidence of significant pathology in the proximal jejunum.      A thickened fold versus a large sessile antral polyp was identified. It       was examined with the biopsy forceps. There was no evidence of any       ulcerations or friability. Multiple cold biopsy were obtained. The EGD       was converted over to an enteroscopy as with maneuvering the endoscope       was able to be advanced in to the proximal jejunum. Impression:               - Normal esophagus.                           - A single gastric polyp. Biopsied.                           - Normal examined duodenum.                           - The examined portion of the jejunum  was normal. Recommendation:           - Return patient to hospital ward for ongoing care.                           - Await pathology results.                           - Follow HGB and transfuse if necessary.                           - Continue to hold Elquis. Procedure Code(s):        --- Professional ---                           [phone], Small intestinal endoscopy, enteroscopy                            beyond second portion of duodenum, not including                            ileum; with biopsy, single or multiple Diagnosis Code(s):        --- Professional ---                           K31.7, Polyp of stomach and duodenum                           K92.1, Melena (includes Hematochezia) CPT copyright 2019 American Medical Association. All rights reserved. The codes documented in this report are preliminary and upon coder review may  be revised to meet current compliance requirements. Luisa Hart  Benson Norway, MD Carol Ada, MD 01/10/2020 11:56:54 AM This report has been signed electronically. Number of Addenda: 0

## 2020-01-10 NOTE — Interval H&P Note (Signed)
History and Physical Interval Note:  01/10/2020 11:28 AM  [name] [name]  has presented today for surgery, with the diagnosis of GI bleed.  The various methods of treatment have been discussed with the patient and family. After consideration of risks, benefits and other options for treatment, the patient has consented to  Procedure(s): ESOPHAGOGASTRODUODENOSCOPY (EGD) WITH PROPOFOL (N/A) as a surgical intervention.  The patient's history has been reviewed, patient examined, no change in status, stable for surgery.  I have reviewed the patient's chart and labs.  Questions were answered to the patient's satisfaction.     Abdulai Blaylock D

## 2020-01-10 NOTE — Progress Notes (Signed)
Attempted to move patient closer to the nurses station because she's confused.  But we were pended a new confused patient day shift decided to keep patient in room 1307.

## 2020-01-10 NOTE — Anesthesia Preprocedure Evaluation (Signed)
Anesthesia Evaluation  Patient identified by MRN, date of birth, ID band Patient awake    Reviewed: Allergy & Precautions, NPO status , Patient's Chart, lab work & pertinent test results  Airway Mallampati: II  TM Distance: >3 FB Neck ROM: Full    Dental   Pulmonary former smoker,    breath sounds clear to auscultation       Cardiovascular hypertension, Pt. on medications and Pt. on home beta blockers + DVT   Rhythm:Regular Rate:Normal     Neuro/Psych Dementia negative neurological ROS     GI/Hepatic Neg liver ROS, GI bleed   Endo/Other  negative endocrine ROS  Renal/GU Renal InsufficiencyRenal disease     Musculoskeletal  (+) Arthritis ,   Abdominal   Peds  Hematology  (+) anemia ,   Anesthesia Other Findings   Reproductive/Obstetrics                             Anesthesia Physical Anesthesia Plan  ASA: III  Anesthesia Plan: MAC   Post-op Pain Management:    Induction:   PONV Risk Score and Plan: 2 and Propofol infusion, Ondansetron and Treatment may vary due to age or medical condition  Airway Management Planned: Natural Airway and Nasal Cannula  Additional Equipment:   Intra-op Plan:   Post-operative Plan:   Informed Consent: I have reviewed the patients History and Physical, chart, labs and discussed the procedure including the risks, benefits and alternatives for the proposed anesthesia with the patient or authorized representative who has indicated his/her understanding and acceptance.       Plan Discussed with: CRNA  Anesthesia Plan Comments:         Anesthesia Quick Evaluation

## 2020-01-10 NOTE — Progress Notes (Signed)
PROGRESS NOTE    [name] [name]  PJK:[id number] DOB: [birth year] DOA: 01/09/2020 PCP: Andi Devon, MD   Brief Narrative: [name] [name] is a 85 y.o. female with medical history significant for history of DVT on Eliquis, dementia, hypertension. Patient presented secondary to dark stools and abdominal pain and found to have severe anemia in the setting of likely upper GI bleeding. Gastroenterology consulted and transfusion given.   Assessment & Plan:   Principal Problem:   Upper GI bleed Active Problems:   Dementia (HCC)   Essential hypertension   DVT, femoral, chronic (HCC)   Chronic anemia   B12 deficiency   CKD (chronic kidney disease) stage 3, GFR 30-59 ml/min   Melena Concern for upper GI bleeding. GI consulted and plan EGD today (01/10/20) -GI recommendations  Chronic anemia Acute blood loss anemia Baseline hemoglobin of about 10. Acute anemia in setting of GI bleeding. Patient presented with a hemoglobin of 5.6 on admission and received 2 unit of PRBC. Hemoglobin of 7.2 today. -Serial CBC/H&H -Management of bleed above  Dementia Lives at home with family. Minimally ambulatory with a walker. Currently with a stable mental status -PT/OT eval tomorrow -Continue Aricept  Recurrent DVT Patient is on lifelong anticoagulation. Current regimen includes Eliquis. Held secondary to active GI bleeding -Recommendations for resumption per GI  Essential hypertension Patient is on Coreg and amlodipine as an outpatient.  CKD stage IIIb Baseline creatinine is difficult to ascertain. Creatinine of 1.5 on admission. Likely at or close to baseline.  B12 deficiency Currently not on supplementation. Outpatient management.  Hyperlipidemia -Continue Lipitor  Prednisone use Unsure of reason. Will need to clarify with family (unable to at time of note)   DVT prophylaxis: SCDs Code Status:   Code Status: Full Code Family Communication: Daughter at bedside Disposition Plan:  Discharge pending management of GI bleeding and stability of hemoglobin   Consultants:   Gastroenterology  Procedures:   None  Antimicrobials:  None    Subjective: No issues per patient. Per daughter, patient tried to pull lines overnight. Restraints placed to ensure patient safety  Objective: Vitals:   01/09/20 2311 01/10/20 0124 01/10/20 0646 01/10/20 0800  BP: (!) 141/85 135/62 (!) 152/65 (!) 157/58  Pulse: (!) 57 (!) 52 (!) 48 (!) 54  Resp: 16 16 18 14   Temp: 98.3 F (36.8 C) 97.7 F (36.5 C) (!) 97.4 F (36.3 C) (!) 97.4 F (36.3 C)  TempSrc: Oral Oral Oral Oral  SpO2: 100% 100% 100% 100%  Weight:      Height:        Intake/Output Summary (Last 24 hours) at 01/10/2020 0831 Last data filed at 01/10/2020 0630 Gross per 24 hour  Intake 1030.33 ml  Output 600 ml  Net 430.33 ml   Filed Weights   01/09/20 1109  Weight: 59.9 kg    Examination:  General exam: Appears calm and comfortable Respiratory system: Clear to auscultation. Respiratory effort normal. Cardiovascular system: S1 & S2 heard, RRR. No murmurs, rubs, gallops or clicks. Gastrointestinal system: Abdomen is nondistended, soft and nontender. No organomegaly or masses felt. Normal bowel sounds heard. Central nervous system: Alert and oriented to self. No focal neurological deficits. Extremities: No edema. No calf tenderness Skin: No cyanosis. No rashes Psychiatry: Judgement and insight appear Impaired. Mood & affect appropriate.     Data Reviewed: I have personally reviewed following labs and imaging studies  CBC: Recent Labs  Lab 01/09/20 1144 01/10/20 0507  WBC 6.0 7.7  HGB 5.6* 7.2*  HCT 19.2* 23.2*  MCV 93.2 93.5  PLT 340 274   Basic Metabolic Panel: Recent Labs  Lab 01/09/20 1144 01/10/20 0507  NA 142 147*  K 4.2 4.1  CL 112* 119*  CO2 24 23  GLUCOSE 109* 89  BUN 37* 26*  CREATININE 1.85* 1.50*  CALCIUM 8.7* 8.2*   GFR: Estimated Creatinine Clearance: 23.4 mL/min (A)  (by C-G formula based on SCr of 1.5 mg/dL (H)). Liver Function Tests: Recent Labs  Lab 01/09/20 1144  AST 16  ALT 8  ALKPHOS 64  BILITOT 0.4  PROT 7.1  ALBUMIN 3.3*   No results for input(s): LIPASE, AMYLASE in the last 168 hours. No results for input(s): AMMONIA in the last 168 hours. Coagulation Profile: No results for input(s): INR, PROTIME in the last 168 hours. Cardiac Enzymes: No results for input(s): CKTOTAL, CKMB, CKMBINDEX, TROPONINI in the last 168 hours. BNP (last 3 results) No results for input(s): PROBNP in the last 8760 hours. HbA1C: No results for input(s): HGBA1C in the last 72 hours. CBG: No results for input(s): GLUCAP in the last 168 hours. Lipid Profile: No results for input(s): CHOL, HDL, LDLCALC, TRIG, CHOLHDL, LDLDIRECT in the last 72 hours. Thyroid Function Tests: No results for input(s): TSH, T4TOTAL, FREET4, T3FREE, THYROIDAB in the last 72 hours. Anemia Panel: No results for input(s): VITAMINB12, FOLATE, FERRITIN, TIBC, IRON, RETICCTPCT in the last 72 hours. Sepsis Labs: No results for input(s): PROCALCITON, LATICACIDVEN in the last 168 hours.  Recent Results (from the past 240 hour(s))  Respiratory Panel by RT PCR (Flu A&B, Covid) - Nasopharyngeal Swab     Status: None   Collection Time: 01/09/20 12:31 PM   Specimen: Nasopharyngeal Swab  Result Value Ref Range Status   SARS Coronavirus 2 by RT PCR NEGATIVE NEGATIVE Final    Comment: (NOTE) SARS-CoV-2 target nucleic acids are NOT DETECTED. The SARS-CoV-2 RNA is generally detectable in upper respiratoy specimens during the acute phase of infection. The lowest concentration of SARS-CoV-2 viral copies this assay can detect is 131 copies/mL. A negative result does not preclude SARS-Cov-2 infection and should not be used as the sole basis for treatment or other patient management decisions. A negative result may occur with  improper specimen collection/handling, submission of specimen other than  nasopharyngeal swab, presence of viral mutation(s) within the areas targeted by this assay, and inadequate number of viral copies (<131 copies/mL). A negative result must be combined with clinical observations, patient history, and epidemiological information. The expected result is Negative. Fact Sheet for Patients:  https://www.moore.com/ Fact Sheet for Healthcare Providers:  https://www.young.biz/ This test is not yet ap proved or cleared by the [place] FDA and  has been authorized for detection and/or diagnosis of SARS-CoV-2 by FDA under an Emergency Use Authorization (EUA). This EUA will remain  in effect (meaning this test can be used) for the duration of the COVID-19 declaration under Section 564(b)(1) of the Act, 21 U.S.C. section 360bbb-3(b)(1), unless the authorization is terminated or revoked sooner.    Influenza A by PCR NEGATIVE NEGATIVE Final   Influenza B by PCR NEGATIVE NEGATIVE Final    Comment: (NOTE) The Xpert Xpress SARS-CoV-2/FLU/RSV assay is intended as an aid in  the diagnosis of influenza from Nasopharyngeal swab specimens and  should not be used as a sole basis for treatment. Nasal washings and  aspirates are unacceptable for Xpert Xpress SARS-CoV-2/FLU/RSV  testing. Fact Sheet for Patients: https://www.moore.com/ Fact Sheet for Healthcare Providers: https://www.young.biz/ This test is not yet  approved or cleared by the [place] and  has been authorized for detection and/or diagnosis of SARS-CoV-2 by  FDA under an Emergency Use Authorization (EUA). This EUA will remain  in effect (meaning this test can be used) for the duration of the  Covid-19 declaration under Section 564(b)(1) of the Act, 21  U.S.C. section 360bbb-3(b)(1), unless the authorization is  terminated or revoked. Performed at [facility], [place] [address], [place], [place] [address]           Radiology Studies: No results found.      Scheduled Meds: . amLODipine  10 mg Oral Daily  . atorvastatin  20 mg Oral q1800  . carvedilol  6.25 mg Oral BID WC  . donepezil  10 mg Oral Daily  . mirtazapine  7.5 mg Oral QHS  . [START ON 01/12/2020] pantoprazole  40 mg Intravenous Q12H   Continuous Infusions: . sodium chloride 50 mL/hr at 01/09/20 1643  . pantoprozole (PROTONIX) infusion 8 mg/hr (01/10/20 0336)     LOS: 0 days     Cordelia Poche, MD [facility] 01/10/2020, 8:31 AM  If 7PM-7AM, please contact night-coverage [email or web address]

## 2020-01-11 DIAGNOSIS — N1832 Chronic kidney disease, stage 3b: Secondary | ICD-10-CM | POA: Diagnosis not present

## 2020-01-11 DIAGNOSIS — D649 Anemia, unspecified: Secondary | ICD-10-CM | POA: Diagnosis not present

## 2020-01-11 DIAGNOSIS — K922 Gastrointestinal hemorrhage, unspecified: Secondary | ICD-10-CM | POA: Diagnosis not present

## 2020-01-11 DIAGNOSIS — F0391 Unspecified dementia with behavioral disturbance: Secondary | ICD-10-CM

## 2020-01-11 LAB — CBC
HCT: 25.2 % — ABNORMAL LOW (ref 36.0–46.0)
Hemoglobin: 7.8 g/dL — ABNORMAL LOW (ref 12.0–15.0)
MCH: 28.6 pg (ref 26.0–34.0)
MCHC: 31 g/dL (ref 30.0–36.0)
MCV: 92.3 fL (ref 80.0–100.0)
Platelets: 304 10*3/uL (ref 150–400)
RBC: 2.73 MIL/uL — ABNORMAL LOW (ref 3.87–5.11)
RDW: 19.1 % — ABNORMAL HIGH (ref 11.5–15.5)
WBC: 7.5 10*3/uL (ref 4.0–10.5)
nRBC: 0 % (ref 0.0–0.2)

## 2020-01-11 LAB — BASIC METABOLIC PANEL
Anion gap: 7 (ref 5–15)
BUN: 16 mg/dL (ref 8–23)
CO2: 22 mmol/L (ref 22–32)
Calcium: 8.2 mg/dL — ABNORMAL LOW (ref 8.9–10.3)
Chloride: 112 mmol/L — ABNORMAL HIGH (ref 98–111)
Creatinine, Ser: 1.35 mg/dL — ABNORMAL HIGH (ref 0.44–1.00)
GFR calc Af Amer: 42 mL/min — ABNORMAL LOW (ref >60)
GFR calc non Af Amer: 36 mL/min — ABNORMAL LOW (ref >60)
Glucose, Bld: 195 mg/dL — ABNORMAL HIGH (ref 70–99)
Potassium: 3.5 mmol/L (ref 3.5–5.1)
Sodium: 141 mmol/L (ref 135–145)

## 2020-01-11 MED ORDER — ENSURE ENLIVE PO LIQD
237.0000 mL | Freq: Two times a day (BID) | ORAL | Status: DC
  Administered 2020-01-12 (×2): 237 mL via ORAL

## 2020-01-11 NOTE — Progress Notes (Signed)
PT Cancellation Note  Patient Details Name: [name] [name] MRN: [id number] DOB: [birth year]   Cancelled Treatment:     PT order received but eval deferred this date at request of family.  Pt's daughter states pt is finally sleeping and prefers she be allowed to sleep and PT follow up in am.   Samina Weekes 01/11/2020, 2:34 PM

## 2020-01-11 NOTE — Progress Notes (Signed)
PROGRESS NOTE    [name] [name]  JAS:[id number] DOB: [birth year] DOA: 01/09/2020 PCP: Andi Devon, MD   Brief Narrative: [name] [name] is a 85 y.o. female with medical history significant for history of DVT on Eliquis, dementia, hypertension. Patient presented secondary to dark stools and abdominal pain and found to have severe anemia in the setting of likely upper GI bleeding. Gastroenterology consulted and transfusion given.   Assessment & Plan:   Principal Problem:   Upper GI bleed Active Problems:   Dementia (HCC)   Essential hypertension   DVT, femoral, chronic (HCC)   Chronic anemia   B12 deficiency   CKD (chronic kidney disease) stage 3, GFR 30-59 ml/min   Melena Concern for upper GI bleeding. GI consulted and plan EGD performed on 4/17 which was significant for a single gastric polyp (biopsied) -GI recommendations: pending today  Chronic anemia Acute blood loss anemia Baseline hemoglobin of about 10. Acute anemia in setting of GI bleeding. Patient presented with a hemoglobin of 5.6 on admission and received 2 unit of PRBC. Hemoglobin of 7.2 post transfusion. Hemoglobin stable at 7.8 today -Serial CBC/H&H -Management of bleed above  Dementia Lives at home with family. Minimally ambulatory with a walker. Currently with a stable mental status -PT/OT eval -Continue Aricept  Recurrent DVT Patient is on lifelong anticoagulation. Current regimen includes Eliquis. Held secondary to active GI bleeding -Recommendations for resumption per GI  Essential hypertension Patient is on Coreg and amlodipine as an outpatient.  CKD stage IIIb Baseline creatinine is difficult to ascertain. Creatinine of 1.5 on admission. Likely at or close to baseline. Creatinine down to 1.35.  B12 deficiency Currently not on supplementation. Outpatient management.  Hyperlipidemia -Continue Lipitor  Prednisone use Unsure of reason. Will need to clarify with family (unable to at time of  note)   DVT prophylaxis: SCDs Code Status:   Code Status: Full Code Family Communication: Daughter at bedside Disposition Plan: Discharge pending management of GI bleeding and stability of hemoglobin   Consultants:   Gastroenterology  Procedures:   EGD (01/10/2020) Impression:               - Normal esophagus.                           - A single gastric polyp. Biopsied.                           - Normal examined duodenum.                           - The examined portion of the jejunum was normal. Recommendation:           - Return patient to hospital ward for ongoing care.                           - Await pathology results.                           - Follow HGB and transfuse if necessary.                           - Continue to hold Elquis.  Antimicrobials:  None    Subjective: Patient tried to remove lines overnight. No bowel movements.  Objective: Vitals:   01/10/20 1220 01/10/20 1337 01/10/20 2106 01/11/20 0458  BP: (!) 124/55 (!) 143/61 138/66 136/73  Pulse: (!) 53 (!) 48 (!) 56 (!) 56  Resp: 18  16 16   Temp: 98.9 F (37.2 C) 98.4 F (36.9 C) 98.1 F (36.7 C) 98 F (36.7 C)  TempSrc: Oral Oral Oral Oral  SpO2: 100% 100% 99% 95%  Weight:      Height:        Intake/Output Summary (Last 24 hours) at 01/11/2020 1221 Last data filed at 01/11/2020 1004 Gross per 24 hour  Intake 938.55 ml  Output 1050 ml  Net -111.45 ml   Filed Weights   01/09/20 1109 01/10/20 1120  Weight: 59.9 kg 60 kg    Examination:  General exam: Appears calm and comfortable Respiratory system: Respiratory effort normal.    Data Reviewed: I have personally reviewed following labs and imaging studies  CBC: Recent Labs  Lab 01/09/20 1144 01/10/20 0507 01/11/20 1048  WBC 6.0 7.7 7.5  HGB 5.6* 7.2* 7.8*  HCT 19.2* 23.2* 25.2*  MCV 93.2 93.5 92.3  PLT 340 274 580   Basic Metabolic Panel: Recent Labs  Lab 01/09/20 1144 01/10/20 0507 01/11/20 1048  NA 142 147* 141    K 4.2 4.1 3.5  CL 112* 119* 112*  CO2 24 23 22   GLUCOSE 109* 89 195*  BUN 37* 26* 16  CREATININE 1.85* 1.50* 1.35*  CALCIUM 8.7* 8.2* 8.2*   GFR: Estimated Creatinine Clearance: 26 mL/min (A) (by C-G formula based on SCr of 1.35 mg/dL (H)). Liver Function Tests: Recent Labs  Lab 01/09/20 1144  AST 16  ALT 8  ALKPHOS 64  BILITOT 0.4  PROT 7.1  ALBUMIN 3.3*   No results for input(s): LIPASE, AMYLASE in the last 168 hours. No results for input(s): AMMONIA in the last 168 hours. Coagulation Profile: No results for input(s): INR, PROTIME in the last 168 hours. Cardiac Enzymes: No results for input(s): CKTOTAL, CKMB, CKMBINDEX, TROPONINI in the last 168 hours. BNP (last 3 results) No results for input(s): PROBNP in the last 8760 hours. HbA1C: No results for input(s): HGBA1C in the last 72 hours. CBG: No results for input(s): GLUCAP in the last 168 hours. Lipid Profile: No results for input(s): CHOL, HDL, LDLCALC, TRIG, CHOLHDL, LDLDIRECT in the last 72 hours. Thyroid Function Tests: No results for input(s): TSH, T4TOTAL, FREET4, T3FREE, THYROIDAB in the last 72 hours. Anemia Panel: No results for input(s): VITAMINB12, FOLATE, FERRITIN, TIBC, IRON, RETICCTPCT in the last 72 hours. Sepsis Labs: No results for input(s): PROCALCITON, LATICACIDVEN in the last 168 hours.  Recent Results (from the past 240 hour(s))  Respiratory Panel by RT PCR (Flu A&B, Covid) - Nasopharyngeal Swab     Status: None   Collection Time: 01/09/20 12:31 PM   Specimen: Nasopharyngeal Swab  Result Value Ref Range Status   SARS Coronavirus 2 by RT PCR NEGATIVE NEGATIVE Final    Comment: (NOTE) SARS-CoV-2 target nucleic acids are NOT DETECTED. The SARS-CoV-2 RNA is generally detectable in upper respiratoy specimens during the acute phase of infection. The lowest concentration of SARS-CoV-2 viral copies this assay can detect is 131 copies/mL. A negative result does not preclude SARS-Cov-2 infection  and should not be used as the sole basis for treatment or other patient management decisions. A negative result may occur with  improper specimen collection/handling, submission of specimen other than nasopharyngeal swab, presence of viral mutation(s) within the areas targeted by this assay, and inadequate  number of viral copies (<131 copies/mL). A negative result must be combined with clinical observations, patient history, and epidemiological information. The expected result is Negative. Fact Sheet for Patients:  https://www.moore.com/ Fact Sheet for Healthcare Providers:  https://www.young.biz/ This test is not yet ap proved or cleared by the [place] FDA and  has been authorized for detection and/or diagnosis of SARS-CoV-2 by FDA under an Emergency Use Authorization (EUA). This EUA will remain  in effect (meaning this test can be used) for the duration of the COVID-19 declaration under Section 564(b)(1) of the Act, 21 U.S.C. section 360bbb-3(b)(1), unless the authorization is terminated or revoked sooner.    Influenza A by PCR NEGATIVE NEGATIVE Final   Influenza B by PCR NEGATIVE NEGATIVE Final    Comment: (NOTE) The Xpert Xpress SARS-CoV-2/FLU/RSV assay is intended as an aid in  the diagnosis of influenza from Nasopharyngeal swab specimens and  should not be used as a sole basis for treatment. Nasal washings and  aspirates are unacceptable for Xpert Xpress SARS-CoV-2/FLU/RSV  testing. Fact Sheet for Patients: https://www.moore.com/ Fact Sheet for Healthcare Providers: https://www.young.biz/ This test is not yet approved or cleared by the [place] FDA and  has been authorized for detection and/or diagnosis of SARS-CoV-2 by  FDA under an Emergency Use Authorization (EUA). This EUA will remain  in effect (meaning this test can be used) for the duration of the  Covid-19 declaration under Section  564(b)(1) of the Act, 21  U.S.C. section 360bbb-3(b)(1), unless the authorization is  terminated or revoked. Performed at [facility], [address], [place], [place] [address]          Radiology Studies: No results found.      Scheduled Meds: . amLODipine  10 mg Oral Daily  . atorvastatin  20 mg Oral q1800  . carvedilol  6.25 mg Oral BID WC  . donepezil  10 mg Oral Daily  . feeding supplement (ENSURE ENLIVE)  237 mL Oral BID BM  . mirtazapine  7.5 mg Oral QHS  . [START ON 01/12/2020] pantoprazole  40 mg Intravenous Q12H   Continuous Infusions: . sodium chloride 50 mL/hr at 01/10/20 2301  . pantoprozole (PROTONIX) infusion 8 mg/hr (01/11/20 1055)     LOS: 1 day     Jacquelin Hawking, MD [facility] 01/11/2020, 12:21 PM  If 7PM-7AM, please contact night-coverage [email or web address]

## 2020-01-11 NOTE — Progress Notes (Signed)
Subjective: No acute events.  Objective: Vital signs in last 24 hours: Temp:  [98 F (36.7 C)-98.5 F (36.9 C)] 98.5 F (36.9 C) (04/18 1400) Pulse Rate:  [45-56] 45 (04/18 1358) Resp:  [16] 16 (04/18 0458) BP: (128-138)/(66-73) 128/72 (04/18 1358) SpO2:  [95 %-100 %] 100 % (04/18 1358) Last BM Date: 01/07/20  Intake/Output from previous day: 04/17 0701 - 04/18 0700 In: 466.3 [P.O.:120; I.V.:346.3] Out: 300 [Urine:300] Intake/Output this shift: Total I/O In: 1214.8 [P.O.:480; I.V.:734.8] Out: 950 [Urine:950]  General appearance: alert and no distress GI: soft, non-tender; bowel sounds normal; no masses,  no organomegaly  Lab Results: Recent Labs    01/09/20 1144 01/10/20 0507 01/11/20 1048  WBC 6.0 7.7 7.5  HGB 5.6* 7.2* 7.8*  HCT 19.2* 23.2* 25.2*  PLT 340 274 304   BMET Recent Labs    01/09/20 1144 01/10/20 0507 01/11/20 1048  NA 142 147* 141  K 4.2 4.1 3.5  CL 112* 119* 112*  CO2 24 23 22   GLUCOSE 109* 89 195*  BUN 37* 26* 16  CREATININE 1.85* 1.50* 1.35*  CALCIUM 8.7* 8.2* 8.2*   LFT Recent Labs    01/09/20 1144  PROT 7.1  ALBUMIN 3.3*  AST 16  ALT 8  ALKPHOS 64  BILITOT 0.4   PT/INR No results for input(s): LABPROT, INR in the last 72 hours. Hepatitis Panel No results for input(s): HEPBSAG, HCVAB, HEPAIGM, HEPBIGM in the last 72 hours. C-Diff No results for input(s): CDIFFTOX in the last 72 hours. Fecal Lactopherrin No results for input(s): FECLLACTOFRN in the last 72 hours.  Studies/Results: No results found.  Medications:  Scheduled: . amLODipine  10 mg Oral Daily  . atorvastatin  20 mg Oral q1800  . carvedilol  6.25 mg Oral BID WC  . donepezil  10 mg Oral Daily  . feeding supplement (ENSURE ENLIVE)  237 mL Oral BID BM  . mirtazapine  7.5 mg Oral QHS  . [START ON 01/12/2020] pantoprazole  40 mg Intravenous Q12H   Continuous: . sodium chloride 50 mL/hr at 01/10/20 2301  . pantoprozole (PROTONIX) infusion 8 mg/hr (01/11/20  1055)    Assessment/Plan: 1) Gastric antral polyp versus gastritis. 2) Anemia.   The patient's HGB is stable.  The biopsy results of the gastric lesion is pending.  It is presumed to be benign.  If it is a hyperplastic polyp, it is prone to bleed, especially on anticoagulation.  Plan: 1) Follow HGB and transfuse as necessary. 2) Await biopsy results.  LOS: 1 day   [name] [name] 01/11/2020, 3:10 PM

## 2020-01-11 NOTE — Progress Notes (Signed)
Initial Nutrition Assessment  RD working remotely.  DOCUMENTATION CODES:   Not applicable  INTERVENTION:  Provide Ensure Enlive po BID, each supplement provides 350 kcal and 20 grams of protein.  NUTRITION DIAGNOSIS:   Predicted suboptimal nutrient intake related to decreased appetite as evidenced by (per MST report).  GOAL:   Patient will meet greater than or equal to 90% of their needs  MONITOR:   PO intake, Supplement acceptance, Labs, Weight trends, I & O's  REASON FOR ASSESSMENT:   Malnutrition Screening Tool    ASSESSMENT:   85 year old female with PMHx of dementia, HTN, arthritis, CKD, vitamin B12 deficiency admitted with melena, acute blood loss anemia in setting of chronic anemia.   -On 4/17 patient underwent EGD that was converted into enteroscopy. Found a single gastric polyp that was biopsied.  Attempted to call patient over the phone but she was unable to answer. Her diet was advanced to regular yesterday. No meal documentation available at this time.  There is limited recent weight history in chart. Patient was 70.1 kg on 01/28/2019. She is now documented to be 60 kg (132.28 lbs). If weights in chart are correct she has lost 10.1 kg (14.4% body weight) over the past year, which is not significant for time frame but is still concerning.  Medications reviewed and include: Remeron 7.5 mg QHS, pantoprazole.  Labs reviewed: Sodium 147, Chloride 119, BUN 26, Creatinine 1.5.  Unable to determine if patient meets criteria for malnutrition at this time.  NUTRITION - FOCUSED PHYSICAL EXAM:  Unable to complete as this time as RD working remotely.  Diet Order:   Diet Order            Diet regular Room service appropriate? Yes; Fluid consistency: Thin  Diet effective now             EDUCATION NEEDS:   No education needs have been identified at this time  Skin:  Skin Assessment: Reviewed RN Assessment  Last BM:  01/07/2020 per chart  Height:   Ht  Readings from Last 1 Encounters:  01/09/20 5' (1.524 m)   Weight:   Wt Readings from Last 1 Encounters:  01/10/20 60 kg   BMI:  Body mass index is 25.83 kg/m.  Estimated Nutritional Needs:   Kcal:  1500-1700  Protein:  75-85 grams  Fluid:  1.5-1.7 L/day  Felix Pacini, MS, RD, LDN Pager number available on Amion

## 2020-01-12 ENCOUNTER — Encounter: Payer: Self-pay | Admitting: *Deleted

## 2020-01-12 DIAGNOSIS — K922 Gastrointestinal hemorrhage, unspecified: Secondary | ICD-10-CM | POA: Diagnosis not present

## 2020-01-12 DIAGNOSIS — N1832 Chronic kidney disease, stage 3b: Secondary | ICD-10-CM | POA: Diagnosis not present

## 2020-01-12 DIAGNOSIS — D649 Anemia, unspecified: Secondary | ICD-10-CM | POA: Diagnosis not present

## 2020-01-12 DIAGNOSIS — I10 Essential (primary) hypertension: Secondary | ICD-10-CM | POA: Diagnosis not present

## 2020-01-12 LAB — CBC
HCT: 23.7 % — ABNORMAL LOW (ref 36.0–46.0)
Hemoglobin: 7.4 g/dL — ABNORMAL LOW (ref 12.0–15.0)
MCH: 29.2 pg (ref 26.0–34.0)
MCHC: 31.2 g/dL (ref 30.0–36.0)
MCV: 93.7 fL (ref 80.0–100.0)
Platelets: 274 10*3/uL (ref 150–400)
RBC: 2.53 MIL/uL — ABNORMAL LOW (ref 3.87–5.11)
RDW: 19.3 % — ABNORMAL HIGH (ref 11.5–15.5)
WBC: 7.2 10*3/uL (ref 4.0–10.5)
nRBC: 0 % (ref 0.0–0.2)

## 2020-01-12 LAB — BPAM RBC
Blood Product Expiration Date: 202105182359
Blood Product Expiration Date: 202105192359
ISSUE DATE / TIME: 202104161346
ISSUE DATE / TIME: 202104162003
Unit Type and Rh: 5100
Unit Type and Rh: 5100

## 2020-01-12 LAB — TYPE AND SCREEN
ABO/RH(D): O POS
Antibody Screen: NEGATIVE
Unit division: 0
Unit division: 0

## 2020-01-12 MED ORDER — CARVEDILOL 3.125 MG PO TABS: 3.1250 mg | ORAL_TABLET | Freq: Two times a day (BID) | ORAL | Status: DC

## 2020-01-12 NOTE — Evaluation (Signed)
Physical Therapy Evaluation Patient Details Name: [name] [name] MRN: [id number] DOB: 1935-02-06 Today's Date: 01/12/2020   History of Present Illness  [name] [name] is a 85 y.o. female with medical history significant for history of DVT on Eliquis, dementia, hypertension. Patient presented secondary to dark stools and abdominal pain and found to have severe anemia in the setting of likely upper GI bleeding. Gastroenterology consulted and transfusion given.  Clinical Impression  Pt admitted with above diagnosis. Pt requires mod assist with all mobility. Pt with some improvement with additional STS reps so therapist could straighten linen in chair under pt only requiring min assist, possibly due to understanding the task because performed it multiple times. Pt requires initiation and verbal cues throughout all mobility to complete task and requiring increased time to complete task. Pt with some facial wincing and reaching for low back upon moving out of bed, but none with STS and transferring to bedside chair. Pt limited with steps in room, has 6 steps with handrails at home and family reports they are able to assist her even when she becomes confused and needs additional help. Pt has good family support at home, but may need higher level of care due to physical assistance needed at this time. Pt currently with functional limitations due to the deficits listed. below (see PT Problem List). Pt will benefit from skilled PT to increase their independence and safety with mobility to allow discharge to the venue listed below.       Follow Up Recommendations SNF(vs home, family reports they are able to care for her at home)    Equipment Recommendations  None recommended by PT    Recommendations for Other Services       Precautions / Restrictions Precautions Precautions: Fall Restrictions Weight Bearing Restrictions: No      Mobility  Bed Mobility Overal bed mobility: Needs Assistance Bed  Mobility: Supine to Sit     Supine to sit: Mod assist;HOB elevated     General bed mobility comments: pt attempts to slide BLE to EOB with physical and verbal cues, requires mod assist to bring BLE to EOB and mod assist to upright trunk with elevated HOB  Transfers Overall transfer level: Needs assistance Equipment used: Rolling walker (2 wheeled);1 person hand held assist Transfers: Sit to/from [employer] Sit to Stand: Mod assist Stand pivot transfers: Mod assist       General transfer comment: cues for hand placement, mod assist to power up from seated surface progressing to min assist with subsequent reps once understanding task  Ambulation/Gait Ambulation/Gait assistance: Mod assist Gait Distance (Feet): 2 Feet Assistive device: Rolling walker (2 wheeled) Gait Pattern/deviations: Step-to pattern;Shuffle;Trunk flexed Gait velocity: decreased   General Gait Details: limited to steps at bedside, shuffling feet forward, backwards and laterally with RW for steadying and mod assist from therapist, trunk flexed forward without improvement with cues  Stairs            Wheelchair Mobility    Modified Rankin (Stroke Patients Only)       Balance Overall balance assessment: Needs assistance Sitting-balance support: Feet supported;No upper extremity supported Sitting balance-Leahy Scale: Fair Sitting balance - Comments: seated EOB, SUPV   Standing balance support: During functional activity;Bilateral upper extremity supported Standing balance-Leahy Scale: Poor Standing balance comment: requires physical assist and RW to maintain balanace                  Pertinent Vitals/Pain Pain Assessment: Faces Faces Pain Scale:  Hurts a little bit Pain Location: back with movement Pain Descriptors / Indicators: Grimacing Pain Intervention(s): Limited activity within patient's tolerance;Monitored during session;Repositioned    Home Living Family/patient  expects to be discharged to:: Private residence Living Arrangements: Children(1 daughter lives with her, 4 additional siblings rotate taking care of her) Available Help at Discharge: Family;Available 24 hours/day Type of Home: House Home Access: Stairs to enter Entrance Stairs-Rails: Right;Left;Can reach both Entrance Stairs-Number of Steps: 6 Home Layout: One level Home Equipment: Walker - 2 wheels;Walker - 4 wheels;Bedside commode;Hospital bed      Prior Function Level of Independence: Needs assistance   Gait / Transfers Assistance Needed: Family reports they occasionally have to assist her out of bed, they steady her while walking with rollator. Family reports pt occasionally forgets how to asecend/descend steps but they are able to assist her slowly.  ADL's / Homemaking Assistance Needed: Family reports they bathe and dress pt; pt has recently required feeding assistance.  Comments: Family reports pt has good and bad days, there are 5 children who assist taking care of pt so she is never alone (1 who lives wtih pt) and HH nursing that checks on pt weekly.     Hand Dominance        Extremity/Trunk Assessment   Upper Extremity Assessment Upper Extremity Assessment: Defer to OT evaluation    Lower Extremity Assessment Lower Extremity Assessment: Generalized weakness(BLE AROM <50%, grossly 3/5)    Cervical / Trunk Assessment Cervical / Trunk Assessment: Kyphotic  Communication   Communication: No difficulties  Cognition Arousal/Alertness: Awake/alert Behavior During Therapy: WFL for tasks assessed/performed Overall Cognitive Status: History of cognitive impairments - at baseline                                 General Comments: Pt's daughter in room reports this is close to pt's baseline cognitively and physically. Pt is pleasantly confused; oriented to self and birthdate, knows she is at doctor office but doesn't reference hospital, unsure of date, unsure of  children.      General Comments      Exercises General Exercises - Lower Extremity Ankle Circles/Pumps: Both;20 reps;Supine   Assessment/Plan    PT Assessment Patient needs continued PT services  PT Problem List Decreased strength;Decreased range of motion;Decreased activity tolerance;Decreased balance;Decreased mobility;Decreased cognition;Decreased knowledge of use of DME       PT Treatment Interventions DME instruction;Gait training;Stair training;Functional mobility training;Therapeutic activities;Therapeutic exercise;Balance training;Neuromuscular re-education;Patient/family education;Manual techniques    PT Goals (Current goals can be found in the Care Plan section)  Acute Rehab PT Goals Patient Stated Goal: return home with family assisting PT Goal Formulation: With family Time For Goal Achievement: 01/26/20 Potential to Achieve Goals: Fair    Frequency Min 3X/week   Barriers to discharge        Co-evaluation               AM-PAC PT "6 Clicks" Mobility  Outcome Measure Help needed turning from your back to your side while in a flat bed without using bedrails?: A Lot Help needed moving from lying on your back to sitting on the side of a flat bed without using bedrails?: A Lot Help needed moving to and from a bed to a chair (including a wheelchair)?: A Lot Help needed standing up from a chair using your arms (e.g., wheelchair or bedside chair)?: A Lot Help needed to walk in hospital room?:  A Lot Help needed climbing 3-5 steps with a railing? : Total 6 Click Score: 11    End of Session Equipment Utilized During Treatment: Gait belt Activity Tolerance: Patient tolerated treatment well;No increased pain Patient left: in chair;with call bell/phone within reach;with chair alarm set;with family/visitor present Nurse Communication: Mobility status PT Visit Diagnosis: Unsteadiness on feet (R26.81);Other abnormalities of gait and mobility (R26.89);Muscle weakness  (generalized) (M62.81)    Time: 5916-3846 PT Time Calculation (min) (ACUTE ONLY): 40 min   Charges:   PT Evaluation $PT Eval Moderate Complexity: 1 Mod           Tori Laetitia Schnepf PT, DPT 01/12/20, 11:45 AM [phone]

## 2020-01-12 NOTE — Progress Notes (Signed)
PROGRESS NOTE    [name] [name]  KGM:[id number] DOB: 1935/04/10 DOA: 01/09/2020 PCP: Andi Devon, MD   Brief Narrative: [name] [name] is a 85 y.o. female with medical history significant for history of DVT on Eliquis, dementia, hypertension. Patient presented secondary to dark stools and abdominal pain and found to have severe anemia in the setting of likely upper GI bleeding. Gastroenterology consulted and transfusion given.   Assessment & Plan:   Principal Problem:   Upper GI bleed Active Problems:   Dementia (HCC)   Essential hypertension   DVT, femoral, chronic (HCC)   Chronic anemia   B12 deficiency   CKD (chronic kidney disease) stage 3, GFR 30-59 ml/min   Melena Concern for upper GI bleeding. GI consulted and plan EGD performed on 4/17 which was significant for a single gastric polyp (biopsied) -GI recommendations: pending today -Biopsy pending  Chronic anemia Acute blood loss anemia Baseline hemoglobin of about 10. Acute anemia in setting of GI bleeding. Patient presented with a hemoglobin of 5.6 on admission and received 2 unit of PRBC. Hemoglobin of 7.2 post transfusion. Hemoglobin down to 7.4 today with no associated repeat bleeding -Serial CBC/H&H -Management of bleed above  Dementia Lives at home with family. Minimally ambulatory with a walker. Currently with a stable mental status -PT/OT eval: SNF, however patient family declining, so will plan for HHPT/OT -Continue Aricept  Recurrent DVT Patient is on lifelong anticoagulation. Current regimen includes Eliquis. Held secondary to active GI bleeding -Recommendations for resumption per GI  Essential hypertension Patient is on Coreg and amlodipine as an outpatient.  CKD stage IIIb Baseline creatinine is difficult to ascertain. Creatinine of 1.5 on admission. Likely at or close to baseline. Creatinine down to 1.35.  B12 deficiency Currently not on supplementation. Outpatient  management.  Hyperlipidemia -Continue Lipitor  Prednisone use Unsure of reason. Will need to clarify with family (unable to at time of note)   DVT prophylaxis: SCDs Code Status:   Code Status: Full Code Family Communication: Daughter at bedside Disposition Plan: Discharge pending GI bleeding recommendations for discharge and decision on restarting Eliquis. Also need to ensure hemoglobin is stable as patient is high risk for return   Consultants:   Gastroenterology  Procedures:   EGD (01/10/2020) Impression:               - Normal esophagus.                           - A single gastric polyp. Biopsied.                           - Normal examined duodenum.                           - The examined portion of the jejunum was normal. Recommendation:           - Return patient to hospital ward for ongoing care.                           - Await pathology results.                           - Follow HGB and transfuse if necessary.                           -  Continue to hold Elquis.  Antimicrobials:  None    Subjective: No issues overnight  Objective: Vitals:   01/11/20 1400 01/11/20 2049 01/12/20 0452 01/12/20 1319  BP:  (!) 111/56 121/60 121/63  Pulse:  (!) 51 (!) 45 (!) 56  Resp:  16 16 16   Temp: 98.5 F (36.9 C) 98.9 F (37.2 C) 98 F (36.7 C) 98.3 F (36.8 C)  TempSrc: Oral Oral Oral Oral  SpO2:  98% 98% 98%  Weight:      Height:        Intake/Output Summary (Last 24 hours) at 01/12/2020 1550 Last data filed at 01/12/2020 1318 Gross per 24 hour  Intake 1575.79 ml  Output 450 ml  Net 1125.79 ml   Filed Weights   01/09/20 1109 01/10/20 1120  Weight: 59.9 kg 60 kg    Examination:  General exam: Appears calm and comfortable Respiratory system: Clear to auscultation. Respiratory effort normal. Cardiovascular system: S1 & S2 heard, RRR. No murmurs, rubs, gallops or clicks. Gastrointestinal system: Abdomen is nondistended, soft and nontender. Normal bowel  sounds heard. Central nervous system: Alert and oriented to self only. Extremities: No edema. No calf tenderness Skin: No cyanosis. No rashes Psychiatry: Judgement and insight appear impaired. Mood & affect appropriate.   Data Reviewed: I have personally reviewed following labs and imaging studies  CBC: Recent Labs  Lab 01/09/20 1144 01/10/20 0507 01/11/20 1048 01/12/20 0502  WBC 6.0 7.7 7.5 7.2  HGB 5.6* 7.2* 7.8* 7.4*  HCT 19.2* 23.2* 25.2* 23.7*  MCV 93.2 93.5 92.3 93.7  PLT 340 274 304 274   Basic Metabolic Panel: Recent Labs  Lab 01/09/20 1144 01/10/20 0507 01/11/20 1048  NA 142 147* 141  K 4.2 4.1 3.5  CL 112* 119* 112*  CO2 24 23 22   GLUCOSE 109* 89 195*  BUN 37* 26* 16  CREATININE 1.85* 1.50* 1.35*  CALCIUM 8.7* 8.2* 8.2*   GFR: Estimated Creatinine Clearance: 26 mL/min (A) (by C-G formula based on SCr of 1.35 mg/dL (H)). Liver Function Tests: Recent Labs  Lab 01/09/20 1144  AST 16  ALT 8  ALKPHOS 64  BILITOT 0.4  PROT 7.1  ALBUMIN 3.3*   No results for input(s): LIPASE, AMYLASE in the last 168 hours. No results for input(s): AMMONIA in the last 168 hours. Coagulation Profile: No results for input(s): INR, PROTIME in the last 168 hours. Cardiac Enzymes: No results for input(s): CKTOTAL, CKMB, CKMBINDEX, TROPONINI in the last 168 hours. BNP (last 3 results) No results for input(s): PROBNP in the last 8760 hours. HbA1C: No results for input(s): HGBA1C in the last 72 hours. CBG: No results for input(s): GLUCAP in the last 168 hours. Lipid Profile: No results for input(s): CHOL, HDL, LDLCALC, TRIG, CHOLHDL, LDLDIRECT in the last 72 hours. Thyroid Function Tests: No results for input(s): TSH, T4TOTAL, FREET4, T3FREE, THYROIDAB in the last 72 hours. Anemia Panel: No results for input(s): VITAMINB12, FOLATE, FERRITIN, TIBC, IRON, RETICCTPCT in the last 72 hours. Sepsis Labs: No results for input(s): PROCALCITON, LATICACIDVEN in the last 168  hours.  Recent Results (from the past 240 hour(s))  Respiratory Panel by RT PCR (Flu A&B, Covid) - Nasopharyngeal Swab     Status: None   Collection Time: 01/09/20 12:31 PM   Specimen: Nasopharyngeal Swab  Result Value Ref Range Status   SARS Coronavirus 2 by RT PCR NEGATIVE NEGATIVE Final    Comment: (NOTE) SARS-CoV-2 target nucleic acids are NOT DETECTED. The SARS-CoV-2 RNA is generally detectable in  upper respiratoy specimens during the acute phase of infection. The lowest concentration of SARS-CoV-2 viral copies this assay can detect is 131 copies/mL. A negative result does not preclude SARS-Cov-2 infection and should not be used as the sole basis for treatment or other patient management decisions. A negative result may occur with  improper specimen collection/handling, submission of specimen other than nasopharyngeal swab, presence of viral mutation(s) within the areas targeted by this assay, and inadequate number of viral copies (<131 copies/mL). A negative result must be combined with clinical observations, patient history, and epidemiological information. The expected result is Negative. Fact Sheet for Patients:  PinkCheek.be Fact Sheet for Healthcare Providers:  GravelBags.it This test is not yet ap proved or cleared by the [place] FDA and  has been authorized for detection and/or diagnosis of SARS-CoV-2 by FDA under an Emergency Use Authorization (EUA). This EUA will remain  in effect (meaning this test can be used) for the duration of the COVID-19 declaration under Section 564(b)(1) of the Act, 21 U.S.C. section 360bbb-3(b)(1), unless the authorization is terminated or revoked sooner.    Influenza A by PCR NEGATIVE NEGATIVE Final   Influenza B by PCR NEGATIVE NEGATIVE Final    Comment: (NOTE) The Xpert Xpress SARS-CoV-2/FLU/RSV assay is intended as an aid in  the diagnosis of influenza from Nasopharyngeal  swab specimens and  should not be used as a sole basis for treatment. Nasal washings and  aspirates are unacceptable for Xpert Xpress SARS-CoV-2/FLU/RSV  testing. Fact Sheet for Patients: PinkCheek.be Fact Sheet for Healthcare Providers: GravelBags.it This test is not yet approved or cleared by the [place] FDA and  has been authorized for detection and/or diagnosis of SARS-CoV-2 by  FDA under an Emergency Use Authorization (EUA). This EUA will remain  in effect (meaning this test can be used) for the duration of the  Covid-19 declaration under Section 564(b)(1) of the Act, 21  U.S.C. section 360bbb-3(b)(1), unless the authorization is  terminated or revoked. Performed at [facility], [place] [address] [place], [place] [address]          Radiology Studies: No results found.      Scheduled Meds: . amLODipine  10 mg Oral Daily  . atorvastatin  20 mg Oral q1800  . donepezil  10 mg Oral Daily  . feeding supplement (ENSURE ENLIVE)  237 mL Oral BID BM  . mirtazapine  7.5 mg Oral QHS  . pantoprazole  40 mg Intravenous Q12H   Continuous Infusions: . pantoprozole (PROTONIX) infusion 8 mg/hr (01/12/20 1005)     LOS: 2 days     Cordelia Poche, MD [facility] 01/12/2020, 3:50 PM  If 7PM-7AM, please contact night-coverage [email or web address]

## 2020-01-12 NOTE — Evaluation (Signed)
Occupational Therapy Evaluation Patient Details Name: [name] [name] MRN: [id number] DOB: 07/20/1935 Today's Date: 01/12/2020    History of Present Illness [name] [name] is a 85 y.o. female with medical history significant for history of DVT on Eliquis, dementia, hypertension. Patient presented secondary to dark stools and abdominal pain and found to have severe anemia in the setting of likely upper GI bleeding. Gastroenterology consulted and transfusion given.   Clinical Impression   Pt admitted with GI bleed. Pt currently with functional limitations due to the deficits listed below (see OT Problem List).  Pt will benefit from skilled OT to increase their safety and independence with ADL and functional mobility for ADL to facilitate discharge to venue listed below.   Family will A pt as needed upon DC     Follow Up Recommendations  Home health OT;Supervision/Assistance - 24 hour    Equipment Recommendations  None recommended by OT    Recommendations for Other Services       Precautions / Restrictions Precautions Precautions: Fall Restrictions Weight Bearing Restrictions: No      Mobility Bed Mobility Overal bed mobility: Needs Assistance Bed Mobility: Supine to Sit     Supine to sit: Mod assist;HOB elevated     General bed mobility comments: pt in chair  Transfers Overall transfer level: Needs assistance Equipment used: 1 person hand held assist Transfers: Sit to/from Stand Sit to Stand: Min assist Stand pivot transfers: Mod assist       General transfer comment: cues for hand placement, mod assist to power up from seated surface progressing to min assist with subsequent reps once understanding task    Balance Overall balance assessment: Needs assistance Sitting-balance support: Feet supported;No upper extremity supported Sitting balance-Leahy Scale: Fair Sitting balance - Comments: seated EOB, SUPV   Standing balance support: During functional  activity;Single extremity supported Standing balance-Leahy Scale: Poor Standing balance comment: requires physical assist and RW to maintain balanace                           ADL either performed or assessed with clinical judgement   ADL Overall ADL's : Needs assistance/impaired Eating/Feeding: Minimal assistance;Sitting   Grooming: Minimal assistance;Sitting   Upper Body Bathing: Minimal assistance;Sitting   Lower Body Bathing: Sit to/from stand;Maximal assistance   Upper Body Dressing : Minimal assistance;Sitting   Lower Body Dressing: Maximal assistance;Sit to/from stand;Cueing for compensatory techniques;Cueing for safety;Cueing for sequencing   Toilet Transfer: Minimal assistance;BSC;Stand-pivot;Cueing for safety;Cueing for sequencing   Toileting- Clothing Manipulation and Hygiene: Minimal assistance;Sit to/from stand;Moderate assistance         General ADL Comments: daugther will pt as needed. Daugther provides 24/7 A with ADL actvity.     Vision Patient Visual Report: No change from baseline              Pertinent Vitals/Pain Pain Assessment: No/denies pain Faces Pain Scale: Hurts a little bit Pain Location: back with movement Pain Descriptors / Indicators: Grimacing Pain Intervention(s): Limited activity within patient's tolerance;Monitored during session;Repositioned     Hand Dominance     Extremity/Trunk Assessment Upper Extremity Assessment Upper Extremity Assessment: Generalized weakness   Lower Extremity Assessment Lower Extremity Assessment: Generalized weakness(BLE AROM <50%, grossly 3/5)   Cervical / Trunk Assessment Cervical / Trunk Assessment: Kyphotic   Communication Communication Communication: No difficulties   Cognition Arousal/Alertness: Awake/alert Behavior During Therapy: WFL for tasks assessed/performed Overall Cognitive Status: History of cognitive impairments - at baseline  General Comments: Pt's daughter in room reports this is close to pt's baseline cognitively and physically. Pt is pleasantly confused; oriented to self and birthdate, knows she is at doctor office but doesn't reference hospital, unsure of date, unsure of children.   General Comments       Exercises General Exercises - Lower Extremity Ankle Circles/Pumps: Both;20 reps;Supine   Shoulder Instructions      Home Living Family/patient expects to be discharged to:: Private residence Living Arrangements: Children(1 daughter lives with her, 4 additional siblings rotate taking care of her) Available Help at Discharge: Family;Available 24 hours/day Type of Home: House Home Access: Stairs to enter [employer] of Steps: 6 Entrance Stairs-Rails: Right;Left;Can reach both Home Layout: One level     Bathroom Shower/Tub: Tub/shower unit;Curtain         Home Equipment: Environmental consultant - 2 wheels;Walker - 4 wheels;Bedside commode;Hospital bed          Prior Functioning/Environment Level of Independence: Needs assistance  Gait / Transfers Assistance Needed: Family reports they occasionally have to assist her out of bed, they steady her while walking with rollator. Family reports pt occasionally forgets how to asecend/descend steps but they are able to assist her slowly. ADL's / Homemaking Assistance Needed: Family reports they bathe and dress pt; pt has recently required feeding assistance.   Comments: Family reports pt has good and bad days, there are 5 children who assist taking care of pt so she is never alone (1 who lives wtih pt) and HH nursing that checks on pt weekly.        OT Problem List: Decreased strength;Impaired balance (sitting and/or standing);Decreased activity tolerance;Decreased safety awareness      OT Treatment/Interventions: Self-care/ADL training;Therapeutic activities;Patient/family education    OT Goals(Current goals can be found in the care plan section)  Acute Rehab OT Goals Patient Stated Goal: return home with family assisting OT Goal Formulation: With patient Time For Goal Achievement: 01/19/20 Potential to Achieve Goals: Good  OT Frequency: Min 2X/week    AM-PAC OT "6 Clicks" Daily Activity     Outcome Measure Help from another person eating meals?: A Little Help from another person taking care of personal grooming?: A Little Help from another person toileting, which includes using toliet, bedpan, or urinal?: A Little Help from another person bathing (including washing, rinsing, drying)?: A Little Help from another person to put on and taking off regular upper body clothing?: A Little Help from another person to put on and taking off regular lower body clothing?: A Lot 6 Click Score: 17   End of Session Nurse Communication: Mobility status  Activity Tolerance: Patient tolerated treatment well Patient left: in chair;with call bell/phone within reach;with chair alarm set;with family/visitor present  OT Visit Diagnosis: Unsteadiness on feet (R26.81);Muscle weakness (generalized) (M62.81);History of falling (Z91.81)                Time: 8338-2505 OT Time Calculation (min): 14 min Charges:  OT General Charges $OT Visit: 1 Visit OT Evaluation $OT Eval Moderate Complexity: 1 Mod  Lise Auer, OT Acute Rehabilitation Services [phone] Office- [phone]     Holle Sprick, Karin Golden D 01/12/2020, 2:57 PM

## 2020-01-13 ENCOUNTER — Other Ambulatory Visit: Payer: Self-pay

## 2020-01-13 DIAGNOSIS — K922 Gastrointestinal hemorrhage, unspecified: Secondary | ICD-10-CM | POA: Diagnosis not present

## 2020-01-13 LAB — CBC
HCT: 25.7 % — ABNORMAL LOW (ref 36.0–46.0)
Hemoglobin: 7.9 g/dL — ABNORMAL LOW (ref 12.0–15.0)
MCH: 28.6 pg (ref 26.0–34.0)
MCHC: 30.7 g/dL (ref 30.0–36.0)
MCV: 93.1 fL (ref 80.0–100.0)
Platelets: 299 10*3/uL (ref 150–400)
RBC: 2.76 MIL/uL — ABNORMAL LOW (ref 3.87–5.11)
RDW: 18.9 % — ABNORMAL HIGH (ref 11.5–15.5)
WBC: 7.4 10*3/uL (ref 4.0–10.5)
nRBC: 0 % (ref 0.0–0.2)

## 2020-01-13 LAB — SURGICAL PATHOLOGY

## 2020-01-13 NOTE — Discharge Instructions (Signed)
[name] [name],  You were in the hospital because of GI bleeding. You had an EGD which may have found the source. A biopsy was obtained. The GI physician recommends holding Eliquis and following up with him in his office.

## 2020-01-13 NOTE — Progress Notes (Signed)
Subjective: No acute events.  Objective: Vital signs in last 24 hours: Temp:  [98.1 F (36.7 C)-98.5 F (36.9 C)] 98.1 F (36.7 C) (04/20 0504) Pulse Rate:  [55-57] 55 (04/20 0504) Resp:  [16-17] 16 (04/20 0504) BP: (121-147)/(63-72) 139/72 (04/20 0504) SpO2:  [96 %-98 %] 98 % (04/20 0504) Last BM Date: 01/07/20  Intake/Output from previous day: 04/19 0701 - 04/20 0700 In: 939.8 [P.O.:720; I.V.:219.8] Out: 900 [Urine:900] Intake/Output this shift: No intake/output data recorded.  General appearance: no distress and sleeping GI: soft, non-tender; bowel sounds normal; no masses,  no organomegaly  Lab Results: Recent Labs    01/11/20 1048 01/12/20 0502 01/13/20 0427  WBC 7.5 7.2 7.4  HGB 7.8* 7.4* 7.9*  HCT 25.2* 23.7* 25.7*  PLT 304 274 299   BMET Recent Labs    01/11/20 1048  NA 141  K 3.5  CL 112*  CO2 22  GLUCOSE 195*  BUN 16  CREATININE 1.35*  CALCIUM 8.2*   LFT No results for input(s): PROT, ALBUMIN, AST, ALT, ALKPHOS, BILITOT, BILIDIR, IBILI in the last 72 hours. PT/INR No results for input(s): LABPROT, INR in the last 72 hours. Hepatitis Panel No results for input(s): HEPBSAG, HCVAB, HEPAIGM, HEPBIGM in the last 72 hours. C-Diff No results for input(s): CDIFFTOX in the last 72 hours. Fecal Lactopherrin No results for input(s): FECLLACTOFRN in the last 72 hours.  Studies/Results: No results found.  Medications:  Scheduled: . amLODipine  10 mg Oral Daily  . atorvastatin  20 mg Oral q1800  . donepezil  10 mg Oral Daily  . feeding supplement (ENSURE ENLIVE)  237 mL Oral BID BM  . mirtazapine  7.5 mg Oral QHS  . pantoprazole  40 mg Intravenous Q12H   Continuous:   Assessment/Plan: 1) GI bleed - presumed to be upper GI. 2) Gastric polyp.   The patient's HGB is stable.  There are no further reports of any melena.  The biopsy results for the gastric polyp are still pending.  There is a suspicion that this is the source of bleeding when on  Eliquis.  I spoke with the daughter the other day about the possibility of a colonoscopy if a bleeding site was not positively identified.  This would be a very difficult task to undertake as an NG tube would be required.  Plan: 1) Okay to D/C home. 2) I will follow up on the biopsy results and made a decision about resection.  The hope is to perform it this week as an outpatient. 3) Hold Eliquis for now.  LOS: 3 days   Broden Holt D 01/13/2020, 7:38 AM

## 2020-01-13 NOTE — TOC Transition Note (Signed)
Transition of Care [facility]) - CM/SW Discharge Note   Patient Details  Name: [name] [name] MRN: [id number] Date of Birth: 1935/06/30  Transition of Care [facility]) CM/SW Contact:  Lennart Pall, LCSW Phone Number: 01/13/2020, 9:53 AM   Clinical Narrative:   Met with pt/ daughter this morning to screen for any d/c needs.  Daughter confirms they are followed by [facility] at home and family provides 24/7 care.  Denies any needs at this time.  I did alert NP with [facility] of pt's d/c today.          Patient Goals and CMS Choice        Discharge Placement                       Discharge Plan and Services                                     Social Determinants of Health (SDOH) Interventions     Readmission Risk Interventions Readmission Risk Prevention Plan 01/13/2020  Post Dischage Appt Complete  Medication Screening Complete  Transportation Screening Complete  Some recent data might be hidden

## 2020-01-13 NOTE — Discharge Summary (Signed)
Physician Discharge Summary  [name] [name]:[id number] DOB: Jan 04, 1935 DOA: 01/09/2020  PCP: Andi Devon, MD  Admit date: 01/09/2020 Discharge date: 01/13/2020  Admitted From: Home Disposition: Home  Recommendations for Outpatient Follow-up:  1. Follow up with PCP in 1 week 2. Please obtain CBC in one week 3. Follow up with Gastroenterology 4. Please follow up on the following pending results: Biopsy results  Home Health: PT, OT Equipment/Devices: None  Discharge Condition: Stable CODE STATUS: Full code Diet recommendation: Regular   Brief/Interim Summary:  Admission HPI written by Mir Vergie Living, MD   Chief Complaint: Dark stools, abd pain   HPI: [name] [name] is a 85 y.o. female with medical history significant for history of DVT on Eliquis, dementia, hypertension be admitted to the hospital with a suspected upper GI bleed.  History is provided by the patient's daughter as the patient is severely demented and unable to give a history.  Daughter with whom the patient lives, says that for the last 9 days, she has noticed dark stools, for about the last week her mother has also complained of abdominal discomfort in the lower abdomen.  She has also had decreased appetite.  She has not had any bright red blood per rectum, no nausea or vomiting, she has no prior history of bleeding.  She has been on Eliquis for about 3 years in total, and is now on lifelong Eliquis due to multiple DVTs.  She came to the emergency department today after lab work with her PCP yesterday demonstrated hemoglobin of 5.  Note that patient is nonambulatory.   Hospital course:  Melena Concern for upper GI bleeding. GI consulted and plan EGD performed on 4/17 which was significant for a single gastric polyp (biopsied). Gi recommending outpatient follow-up. No recurrent bleeding. Biopsy pending on discharge.  Chronic anemia Acute blood loss anemia Baseline hemoglobin of about 10.  Acute anemia in setting of GI bleeding. Patient presented with a hemoglobin of 5.6 on admission and received 2 unit of PRBC. Hemoglobin of 7.2 post transfusion and remained stable.  Dementia Lives at home with family. Minimally ambulatory with a walker. Currently with a stable mental status. PT recommending SNF but family requesting discharge home. Home with PT/OT. Continue Aricept.  Recurrent DVT Patient is on lifelong anticoagulation. Current regimen includes Eliquis. Held secondary to active GI bleeding. Eliquis held on discharge. Restart per gastroenterology recommendations.  Essential hypertension Patient is on Coreg and amlodipine as an outpatient.  CKD stage IIIb Baseline creatinine is difficult to ascertain. Creatinine of 1.5 on admission. Likely at or close to baseline. Creatinine down to 1.35.  B12 deficiency Currently not on supplementation. Outpatient management.  Hyperlipidemia Continue Lipitor   Discharge Diagnoses:  Principal Problem:   Upper GI bleed Active Problems:   Dementia (HCC)   Essential hypertension   DVT, femoral, chronic (HCC)   Chronic anemia   B12 deficiency   CKD (chronic kidney disease) stage 3, GFR 30-59 ml/min    Discharge Instructions  Discharge Instructions    Increase activity slowly   Complete by: As directed      Allergies as of 01/13/2020   No Known Allergies     Medication List    STOP taking these medications   carvedilol 3.125 MG tablet Commonly known as: COREG   cyclobenzaprine 10 MG tablet Commonly known as: FLEXERIL   docusate sodium 100 MG capsule Commonly known as: COLACE   Eliquis DVT/PE Starter Pack 5 MG Tabs   ibuprofen  200 MG tablet Commonly known as: ADVIL   predniSONE 20 MG tablet Commonly known as: DELTASONE   traMADol 50 MG tablet Commonly known as: ULTRAM     TAKE these medications   acetaminophen-codeine 300-30 MG tablet Commonly known as: TYLENOL #3 Take 1-2 tablets by mouth every 6  (six) hours as needed for moderate pain.   amLODipine 10 MG tablet Commonly known as: NORVASC Take 1 tablet (10 mg total) by mouth daily.   atorvastatin 20 MG tablet Commonly known as: LIPITOR Take 20 mg by mouth daily at 6 PM.   CoQ10 100 MG Caps Take 1 tablet by mouth daily.   donepezil 10 MG tablet Commonly known as: ARICEPT Take 10 mg by mouth daily.   feeding supplement (ENSURE ENLIVE) Liqd Take 237 mLs by mouth 2 (two) times daily between meals. What changed: when to take this   lisinopril 5 MG tablet Commonly known as: ZESTRIL Take 7.5 mg by mouth daily.   mirtazapine 7.5 MG tablet Commonly known as: REMERON Take 7.5 mg by mouth at bedtime.      Follow-up Information    Willey Blade, MD. Schedule an appointment as soon as possible for a visit in 1 week(s).   Specialty: Internal Medicine Contact information: [address] [place] [address] [phone]        Carol Ada, MD. Schedule an appointment as soon as possible for a visit in 1 week(s).   Specialty: Gastroenterology Contact information: [address] [place] [address] [phone]          No Known Allergies  Consultations:  Gastroenterology   Procedures/Studies:  No results found.   EGD (01/10/2020) Impression: - Normal esophagus. - A single gastric polyp. Biopsied. - Normal examined duodenum. - The examined portion of the jejunum was normal. Recommendation: - Return patient to hospital ward for ongoing care. - Await pathology results. - Follow HGB and transfuse if necessary. - Continue to hold Elquis.   Subjective: No issues overnight  Discharge Exam: Vitals:   01/12/20 2155 01/13/20 0504  BP: (!) 147/64 139/72  Pulse: (!) 57 (!) 55  Resp: 17 16    Temp: 98.5 F (36.9 C) 98.1 F (36.7 C)  SpO2: 96% 98%   Vitals:   01/12/20 0452 01/12/20 1319 01/12/20 2155 01/13/20 0504  BP: 121/60 121/63 (!) 147/64 139/72  Pulse: (!) 45 (!) 56 (!) 57 (!) 55  Resp: 16 16 17 16   Temp: 98 F (36.7 C) 98.3 F (36.8 C) 98.5 F (36.9 C) 98.1 F (36.7 C)  TempSrc: Oral Oral Oral Oral  SpO2: 98% 98% 96% 98%  Weight:      Height:        General: Pt is alert, awake, not in acute distress    The results of significant diagnostics from this hospitalization (including imaging, microbiology, ancillary and laboratory) are listed below for reference.     Microbiology: Recent Results (from the past 240 hour(s))  Respiratory Panel by RT PCR (Flu A&B, Covid) - Nasopharyngeal Swab     Status: None   Collection Time: 01/09/20 12:31 PM   Specimen: Nasopharyngeal Swab  Result Value Ref Range Status   SARS Coronavirus 2 by RT PCR NEGATIVE NEGATIVE Final    Comment: (NOTE) SARS-CoV-2 target nucleic acids are NOT DETECTED. The SARS-CoV-2 RNA is generally detectable in upper respiratoy specimens during the acute phase of infection. The lowest concentration of SARS-CoV-2 viral copies this assay can detect is 131 copies/mL. A negative  result does not preclude SARS-Cov-2 infection and should not be used as the sole basis for treatment or other patient management decisions. A negative result may occur with  improper specimen collection/handling, submission of specimen other than nasopharyngeal swab, presence of viral mutation(s) within the areas targeted by this assay, and inadequate number of viral copies (<131 copies/mL). A negative result must be combined with clinical observations, patient history, and epidemiological information. The expected result is Negative. Fact Sheet for Patients:  https://www.moore.com/ Fact Sheet for Healthcare Providers:  https://www.young.biz/ This test is not yet ap proved or cleared  by the [place] FDA and  has been authorized for detection and/or diagnosis of SARS-CoV-2 by FDA under an Emergency Use Authorization (EUA). This EUA will remain  in effect (meaning this test can be used) for the duration of the COVID-19 declaration under Section 564(b)(1) of the Act, 21 U.S.C. section 360bbb-3(b)(1), unless the authorization is terminated or revoked sooner.    Influenza A by PCR NEGATIVE NEGATIVE Final   Influenza B by PCR NEGATIVE NEGATIVE Final    Comment: (NOTE) The Xpert Xpress SARS-CoV-2/FLU/RSV assay is intended as an aid in  the diagnosis of influenza from Nasopharyngeal swab specimens and  should not be used as a sole basis for treatment. Nasal washings and  aspirates are unacceptable for Xpert Xpress SARS-CoV-2/FLU/RSV  testing. Fact Sheet for Patients: https://www.moore.com/ Fact Sheet for Healthcare Providers: https://www.young.biz/ This test is not yet approved or cleared by the [place] FDA and  has been authorized for detection and/or diagnosis of SARS-CoV-2 by  FDA under an Emergency Use Authorization (EUA). This EUA will remain  in effect (meaning this test can be used) for the duration of the  Covid-19 declaration under Section 564(b)(1) of the Act, 21  U.S.C. section 360bbb-3(b)(1), unless the authorization is  terminated or revoked. Performed at [facility], [address] [place], [place] [address]      Labs: BNP (last 3 results) Recent Labs    01/28/19 0037  BNP 71.8   Basic Metabolic Panel: Recent Labs  Lab 01/09/20 1144 01/10/20 0507 01/11/20 1048  NA 142 147* 141  K 4.2 4.1 3.5  CL 112* 119* 112*  CO2 24 23 22   GLUCOSE 109* 89 195*  BUN 37* 26* 16  CREATININE 1.85* 1.50* 1.35*  CALCIUM 8.7* 8.2* 8.2*   Liver Function Tests: Recent Labs  Lab 01/09/20 1144  AST 16  ALT 8  ALKPHOS 64  BILITOT 0.4  PROT 7.1  ALBUMIN 3.3*   No results for input(s):  LIPASE, AMYLASE in the last 168 hours. No results for input(s): AMMONIA in the last 168 hours. CBC: Recent Labs  Lab 01/09/20 1144 01/10/20 0507 01/11/20 1048 01/12/20 0502 01/13/20 0427  WBC 6.0 7.7 7.5 7.2 7.4  HGB 5.6* 7.2* 7.8* 7.4* 7.9*  HCT 19.2* 23.2* 25.2* 23.7* 25.7*  MCV 93.2 93.5 92.3 93.7 93.1  PLT 340 274 304 274 299   Cardiac Enzymes: No results for input(s): CKTOTAL, CKMB, CKMBINDEX, TROPONINI in the last 168 hours. BNP: Invalid input(s): POCBNP CBG: No results for input(s): GLUCAP in the last 168 hours. D-Dimer No results for input(s): DDIMER in the last 72 hours. Hgb A1c No results for input(s): HGBA1C in the last 72 hours. Lipid Profile No results for input(s): CHOL, HDL, LDLCALC, TRIG, CHOLHDL, LDLDIRECT in the last 72 hours. Thyroid function studies No results for input(s): TSH, T4TOTAL, T3FREE, THYROIDAB in the last 72 hours.  Invalid input(s): FREET3 Anemia work  up No results for input(s): VITAMINB12, FOLATE, FERRITIN, TIBC, IRON, RETICCTPCT in the last 72 hours. Urinalysis    Component Value Date/Time   COLORURINE YELLOW 01/29/2019 0630   APPEARANCEUR HAZY (A) 01/29/2019 0630   LABSPEC 1.011 01/29/2019 0630   PHURINE 6.0 01/29/2019 0630   GLUCOSEU NEGATIVE 01/29/2019 0630   HGBUR NEGATIVE 01/29/2019 0630   BILIRUBINUR NEGATIVE 01/29/2019 0630   KETONESUR NEGATIVE 01/29/2019 0630   PROTEINUR 30 (A) 01/29/2019 0630   UROBILINOGEN 0.2 11/23/2018 1422   NITRITE NEGATIVE 01/29/2019 0630   LEUKOCYTESUR MODERATE (A) 01/29/2019 0630   Sepsis Labs Invalid input(s): PROCALCITONIN,  WBC,  LACTICIDVEN Microbiology Recent Results (from the past 240 hour(s))  Respiratory Panel by RT PCR (Flu A&B, Covid) - Nasopharyngeal Swab     Status: None   Collection Time: 01/09/20 12:31 PM   Specimen: Nasopharyngeal Swab  Result Value Ref Range Status   SARS Coronavirus 2 by RT PCR NEGATIVE NEGATIVE Final    Comment: (NOTE) SARS-CoV-2 target nucleic acids are  NOT DETECTED. The SARS-CoV-2 RNA is generally detectable in upper respiratoy specimens during the acute phase of infection. The lowest concentration of SARS-CoV-2 viral copies this assay can detect is 131 copies/mL. A negative result does not preclude SARS-Cov-2 infection and should not be used as the sole basis for treatment or other patient management decisions. A negative result may occur with  improper specimen collection/handling, submission of specimen other than nasopharyngeal swab, presence of viral mutation(s) within the areas targeted by this assay, and inadequate number of viral copies (<131 copies/mL). A negative result must be combined with clinical observations, patient history, and epidemiological information. The expected result is Negative. Fact Sheet for Patients:  https://www.moore.com/ Fact Sheet for Healthcare Providers:  https://www.young.biz/ This test is not yet ap proved or cleared by the [place] FDA and  has been authorized for detection and/or diagnosis of SARS-CoV-2 by FDA under an Emergency Use Authorization (EUA). This EUA will remain  in effect (meaning this test can be used) for the duration of the COVID-19 declaration under Section 564(b)(1) of the Act, 21 U.S.C. section 360bbb-3(b)(1), unless the authorization is terminated or revoked sooner.    Influenza A by PCR NEGATIVE NEGATIVE Final   Influenza B by PCR NEGATIVE NEGATIVE Final    Comment: (NOTE) The Xpert Xpress SARS-CoV-2/FLU/RSV assay is intended as an aid in  the diagnosis of influenza from Nasopharyngeal swab specimens and  should not be used as a sole basis for treatment. Nasal washings and  aspirates are unacceptable for Xpert Xpress SARS-CoV-2/FLU/RSV  testing. Fact Sheet for Patients: https://www.moore.com/ Fact Sheet for Healthcare Providers: https://www.young.biz/ This test is not yet approved or  cleared by the [place] FDA and  has been authorized for detection and/or diagnosis of SARS-CoV-2 by  FDA under an Emergency Use Authorization (EUA). This EUA will remain  in effect (meaning this test can be used) for the duration of the  Covid-19 declaration under Section 564(b)(1) of the Act, 21  U.S.C. section 360bbb-3(b)(1), unless the authorization is  terminated or revoked. Performed at [facility], [address], [place], [place] [address]      Time coordinating discharge: 35 minutes  SIGNED:   Jacquelin Hawking, MD [facility] 01/13/2020, 10:11 AM

## 2020-01-13 NOTE — Progress Notes (Signed)
Discharge instructions discussed with patient and daughter, verbalized agreement and understanding 

## 2020-01-29 ENCOUNTER — Other Ambulatory Visit: Payer: Medicare Other | Admitting: Hospice

## 2020-01-29 ENCOUNTER — Other Ambulatory Visit: Payer: Self-pay

## 2020-01-29 DIAGNOSIS — F0391 Unspecified dementia with behavioral disturbance: Secondary | ICD-10-CM

## 2020-01-29 DIAGNOSIS — Z515 Encounter for palliative care: Secondary | ICD-10-CM

## 2020-01-29 NOTE — Progress Notes (Signed)
Therapist, nutritional Palliative Care Consult Note Telephone: [phone]  Fax: [phone]  PATIENT NAME: [name] [name] DOB: [birth year] MRN: [id number]  PRIMARY CARE PROVIDER:   Andi Devon, MD  REFERRING PROVIDER: Andi Devon, MD  RESPONSIBLE PARTY:Extended Emergency Contact Information Primary Emergency Contact: [name] [name],[name] [name] [phone] cell at home Home Phone: [phone] Work Phone: [phone] Mobile Phone: [phone] Relation: Daughter [name] [name] - [phone]   RECOMMENDATIONS/PLAN:  Advance Care Planning/Goals of Care: Visit consisted of building trust and follow up on palliative care. After extensive discussions with family on dementia disease trajectory and goals of care clarification, [name] [name] elected DO NOT RESUSCITATE.  DNR form signed today and copy left with patient; same  uploaded to Epic patient is a full code.MOST form discussion today.  [name] [name] showed MOST form filled out by [facility] NP.  Selections include comfort care no feeding tube.  MOST form uploaded to epic today.  Goals of care include to maximize quality of life and symptom management.   [name] [name] indicated that when this NP advises that patient may be ready for hospice services, family will be interested in embracing hospice service.  Symptom management:Patient continues to decline in her functional status in line with dementia disease trajectory.  She is now fed by family members; she requires max assist for ADLs.  With worsening memory loss, worsening gait r/t Dementia; requiring more cueing;   appetite improving.  She is incontinent of bowel and bladder. FAST 6d. Not walking around as before d/t increasing weakness. Encouraged feeding patient with soft foods like oatmeal, pudding, finger foods; several small meals a day.  Patient denied pain/discomfort.  FLACC 0.  She is compliant with her medications - Lisinopril, Aricept, Amlodipine.  [name] [name]  reported that patient admitted in the hospital for GI bleed in April 2021. Eliquis subsequently reduced to 2.5mg  BID; also started with Omeprazole 40mg daily by GI.  Patient on long term Eliquis for history of multple DVTs. [facility]  provides nurse visits and SW visits monthly as needed.  She continues on mirtazapine 7.5mg  daily to boost appetite. Patient seeing Dr today -GI doctor- for follow up on previous GI bleed. No recent GI bleed.  Follow [address] care will continue to follow patient for goals of care clarification and symptom management. I spent1 hour [id number] providing this consultation; time includes chart review and documentation. More than 50% of the time in this consultation was spent on coordinating communication  HISTORY OF PRESENT ILLNESS:[name] W McCrayis a 85 y.o.year oldfemalewith multiple medical problems including dementia with behavioral disturbances, HTN, OA, Chronic back pain. Palliative Care was asked to help address goals of care. CODE STATUS: DNR PPS: 40% HOSPICE ELIGIBILITY/DIAGNOSIS: TBD  PAST MEDICAL HISTORY:  Past Medical History:  Diagnosis Date  . Arthritis   . Dementia (HCC)   . Dvt femoral (deep venous thrombosis) (HCC)   . Hypertension     SOCIAL HX:  Social History   Tobacco Use  . Smoking status: Former Smoker    Types: Cigarettes    Quit date: 09/25/1976    Years since quitting: 43.3  . Smokeless tobacco: Never Used  Substance Use Topics  . Alcohol use: No    ALLERGIES: No Known Allergies   PERTINENT MEDICATIONS:  Outpatient Encounter Medications as of 01/29/2020  Medication Sig  . acetaminophen-codeine (TYLENOL #3) 300-30 MG tablet Take 1-2 tablets by mouth every 6 (six) hours as needed for moderate pain.   03/30/2020 amLODipine (NORVASC) 10 MG tablet Take 1 tablet (10  mg total) by mouth daily.  Marland Kitchen atorvastatin (LIPITOR) 20 MG tablet Take 20 mg by mouth daily at 6 PM.   . Coenzyme Q10 (COQ10) 100 MG CAPS Take 1 tablet by mouth daily.   Marland Kitchen donepezil (ARICEPT) 10 MG tablet Take 10 mg by mouth daily.  . feeding supplement, ENSURE ENLIVE, (ENSURE ENLIVE) LIQD Take 237 mLs by mouth 2 (two) times daily between meals. (Patient taking differently: Take 237 mLs by mouth 3 (three) times daily between meals. )  . lisinopril (PRINIVIL,ZESTRIL) 5 MG tablet Take 7.5 mg by mouth daily.  . mirtazapine (REMERON) 7.5 MG tablet Take 7.5 mg by mouth at bedtime.   No facility-administered encounter medications on file as of 01/29/2020.    PHYSICAL EXAM/ROS  General: NAD, cooperative Cardiovascular: regular rate and rhythm; denies chest pain/discomfort Pulmonary: clear ant/post fields; normal respiratory effort Abdomen: soft, nontender, + bowel sounds GU: no suprapubic tenderness Extremities: Mild nonpitting edema to BLE, no joint deformities Skin: no rashes to exposed skin Neurological: Weakness but otherwise nonfocal  Teodoro Spray, NP

## 2020-04-07 ENCOUNTER — Other Ambulatory Visit: Payer: Self-pay

## 2020-04-07 ENCOUNTER — Other Ambulatory Visit: Payer: Medicare Other | Admitting: Hospice

## 2020-04-07 DIAGNOSIS — Z515 Encounter for palliative care: Secondary | ICD-10-CM

## 2020-04-07 DIAGNOSIS — F0391 Unspecified dementia with behavioral disturbance: Secondary | ICD-10-CM

## 2020-04-07 NOTE — Progress Notes (Signed)
Therapist, nutritional Palliative Care Consult Note Telephone: [phone]  Fax: [phone]  PATIENT NAME: [name] [name] DOB: [birth year] MRN: [id number]  PRIMARY CARE PROVIDER:   Andi Devon, MD  REFERRING PROVIDER: Andi Devon, MD  RESPONSIBLE PARTY:Extended Emergency Contact Information Primary Emergency Contact: [name],[name] [phone] cell at home Home Phone: [phone] Work Phone: [phone] Mobile Phone: [phone] Relation: Daughter Claudette - [phone]   RECOMMENDATIONS/PLAN:  Advance Care Planning/Goals of Care: Visit consisted of building trust and follow up on palliative care. Patient is a DO NOT RESUSCITATE.  DNR and MOST forms uploaded to epic.  MOST form selections include comfort care and no feeding tube. After extensive discussions with family on dementia disease trajectory and goals of care.  Visit consisted of counseling and education dealing with the complex and emotionally intense issues of symptom management and palliative care in the setting of serious and potentially life-threatening illness. Palliative care team will continue to support patient, patient's family, and medical team.  [name] affirmed that [facility] is now involved in patient's care.  NP informed family that [facility] will back off but happy to step in to ensure a smooth transition to hospice service when patient qualifies.  Contact information left with family.  Symptom management:Patient in no acute distress.  Ongoing memory loss related to dementia. She is incontinent of bowel and bladder.FAST 6d. Not walking around as before d/t increasing weakness.Encouraged feeding patient with soft foods like oatmeal, pudding, finger foods; several small meals a day.  Patient denied pain/discomfort.  FLACC 0.  She is compliant with her medications - Lisinopril, Aricept, Amlodipine.  She continues on mirtazapine 7.5mg  daily to boost  appetite/sleep.  Follow PJ:ASNKNLZJQB care will continue to follow patient for goals of care clarification and symptom management. I spent36minutes providing this consultation; time includes chart review and documentation.More than 50% of the time in this consultation was spent on coordinating communication  HISTORY OF PRESENT ILLNESS:[name] W McCrayis a 85 y.o.year oldfemalewith multiple medical problems including dementia with behavioral disturbances, HTN, OA, Chronic back pain. Palliative Care was asked to help address goals of care. CODE STATUS:DNR PPS: 40% HOSPICE ELIGIBILITY/DIAGNOSIS: TBD  PAST MEDICAL HISTORY:  Past Medical History:  Diagnosis Date  . Arthritis   . Dementia (HCC)   . Dvt femoral (deep venous thrombosis) (HCC)   . Hypertension     SOCIAL HX:  Social History   Tobacco Use  . Smoking status: Former Smoker    Types: Cigarettes    Quit date: 09/25/1976    Years since quitting: 43.5  . Smokeless tobacco: Never Used  Substance Use Topics  . Alcohol use: No    ALLERGIES: No Known Allergies   PERTINENT MEDICATIONS:  Outpatient Encounter Medications as of 04/07/2020  Medication Sig  . acetaminophen-codeine (TYLENOL #3) 300-30 MG tablet Take 1-2 tablets by mouth every 6 (six) hours as needed for moderate pain.   Marland Kitchen amLODipine (NORVASC) 10 MG tablet Take 1 tablet (10 mg total) by mouth daily.  Marland Kitchen atorvastatin (LIPITOR) 20 MG tablet Take 20 mg by mouth daily at 6 PM.   . Coenzyme Q10 (COQ10) 100 MG CAPS Take 1 tablet by mouth daily.  Marland Kitchen donepezil (ARICEPT) 10 MG tablet Take 10 mg by mouth daily.  . feeding supplement, ENSURE ENLIVE, (ENSURE ENLIVE) LIQD Take 237 mLs by mouth 2 (two) times daily between meals. (Patient taking differently: Take 237 mLs by mouth 3 (three) times daily between meals. )  . lisinopril (PRINIVIL,ZESTRIL) 5  MG tablet Take 7.5 mg by mouth daily.  . mirtazapine (REMERON) 7.5 MG tablet Take 7.5 mg by mouth at bedtime.   No  facility-administered encounter medications on file as of 04/07/2020.    PHYSICAL EXAM/ROS  General: NAD, cooperative Cardiovascular: regular rate and rhythm; denies chest pain/discomfort Pulmonary: clear ant/post fields; normal respiratory effort Abdomen: soft, nontender, + bowel sounds GU: no suprapubic tenderness Extremities: Mild nonpitting edema to BLE, no joint deformities Skin: no rashes to exposed skin Neurological: Weakness but otherwise nonfocal  [name] Carpenter, NP

## 2020-06-02 ENCOUNTER — Other Ambulatory Visit: Payer: Medicare Other | Admitting: Hospice

## 2020-06-02 DIAGNOSIS — Z515 Encounter for palliative care: Secondary | ICD-10-CM

## 2020-06-02 DIAGNOSIS — F0391 Unspecified dementia with behavioral disturbance: Secondary | ICD-10-CM

## 2020-06-02 NOTE — Progress Notes (Signed)
Therapist, nutritional Palliative Care Consult Note Telephone: [phone]  Fax: [phone]  PATIENT NAME: [name] [name] DOB: Sep 20, 1935 MRN: [id number]  PRIMARY CARE PROVIDER:   Andi Devon, MD Andi Devon, MD [address] [place],  [place] [address]  REFERRING PROVIDER: Andi Devon, MD Andi Devon, MD [address] [place],  [place] [address] PRIMARY CARE PROVIDER:Shelton, Cala Bradford, MD  REFERRING PROVIDER:Shelton, Cala Bradford, MD  RESPONSIBLE PARTY:Extended Emergency Contact Information Primary Emergency Contact: [name] [name] [phone] cell at home Home Phone: [phone] Work Phone: [phone] Mobile Phone: [phone] Relation: Daughter Claudette - [phone]  TELEHEALTH VISIT STATEMENT Due to the COVID-19 crisis, this visit was done via telephone from my office. It was initiated and consented to by this patient and/or family.    RECOMMENDATIONS/PLAN:  Advance Care Planning/Goals of Care: Visit consisted of building trust and follow up on palliative care. [name] [name] had requested a visit for assessment for possible hospice eligibility.   CODE STATUS: CODE STATUS reviewed.  Patient is a DO NOT RESUSCITATE.   Goals of care: Goals of care include to maximize quality of life and symptom management. MOST form selections include comfort care and no feeding tube.  Visit consisted of discussion dealing with the complex and emotionally intense issues of symptom management and palliative care in the setting of serious and potentially life-threatening illness. Palliative care team will continue to support patient, patient's family, and medical team.  Palliative care team will continue to support patient, patient's family, and medical team.  [name] [name] reported that no one from [facility] has called or visited since this NP's last visit. NP assured her that [facility] will continue to provide  support to patient/family and will be available to ensure a smooth transition to hospice service when patient qualifies for it.  Patient is not appropriate for hospice services at this time; remains appropriate for palliative services. Follow SN:KNLZJQBHAL care will continue to follow patient for goals of care clarification and symptom management.  Follow-up in 3 months/prn Symptom management:Patient is stable at this time with no acute concerns; no medical acuity or hospitalization since last visit.  Ongoing memory loss related to dementia.She is incontinent of bowel and bladder.FAST 6d. Not walking around as before d/t increasing weakness.Encouraged feeding patient with soft foods like oatmeal, pudding, finger foods; several small meals a day.She is compliant with her medications - Lisinopril, Aricept, Amlodipine.She continues on mirtazapine 7.5mg  daily to boost appetite/sleep.  Patient remains appropriate for palliative service.  NP encouraged ongoing care.  I spent82minutes providing this consultation; time includes time spent with patient/family, chart review and documentation.More than 50% of the time in this consultation was spent on coordinating communication  HISTORY OF PRESENT ILLNESS:[name] W McCrayis a 85 y.o.year oldfemalewith multiple medical problems including dementia with behavioral disturbances, HTN, OA, Chronic back pain. Palliative Care was asked to help address goals of care. CODE STATUS:DNR PPS:40% HOSPICE ELIGIBILITY/DIAGNOSIS: TBD  PAST MEDICAL HISTORY:  Past Medical History:  Diagnosis Date  . Arthritis   . Dementia (HCC)   . Dvt femoral (deep venous thrombosis) (HCC)   . Hypertension     SOCIAL HX:  Social History   Tobacco Use  . Smoking status: Former Smoker    Types: Cigarettes    Quit date: 09/25/1976    Years since quitting: 43.7  . Smokeless tobacco: Never Used  Substance Use Topics  . Alcohol use: No    ALLERGIES: No Known Allergies    PERTINENT MEDICATIONS:  Outpatient  Encounter Medications as of 06/02/2020  Medication Sig  . acetaminophen-codeine (TYLENOL #3) 300-30 MG tablet Take 1-2 tablets by mouth every 6 (six) hours as needed for moderate pain.   Marland Kitchen amLODipine (NORVASC) 10 MG tablet Take 1 tablet (10 mg total) by mouth daily.  Marland Kitchen atorvastatin (LIPITOR) 20 MG tablet Take 20 mg by mouth daily at 6 PM.   . Coenzyme Q10 (COQ10) 100 MG CAPS Take 1 tablet by mouth daily.  Marland Kitchen donepezil (ARICEPT) 10 MG tablet Take 10 mg by mouth daily.  . feeding supplement, ENSURE ENLIVE, (ENSURE ENLIVE) LIQD Take 237 mLs by mouth 2 (two) times daily between meals. (Patient taking differently: Take 237 mLs by mouth 3 (three) times daily between meals. )  . lisinopril (PRINIVIL,ZESTRIL) 5 MG tablet Take 7.5 mg by mouth daily.  . mirtazapine (REMERON) 7.5 MG tablet Take 7.5 mg by mouth at bedtime.   No facility-administered encounter medications on file as of 06/02/2020.    PHYSICAL EXAM/ROS:  Rosaura Carpenter, NP

## 2020-06-03 ENCOUNTER — Other Ambulatory Visit: Payer: Self-pay

## 2020-07-07 ENCOUNTER — Emergency Department (HOSPITAL_COMMUNITY): Payer: Medicare Other

## 2020-07-07 ENCOUNTER — Inpatient Hospital Stay (HOSPITAL_COMMUNITY)
Admission: EM | Admit: 2020-07-07 | Discharge: 2020-07-13 | DRG: 291 | Disposition: A | Discharge status: Hospice | Payer: Medicare Other | Attending: Internal Medicine | Admitting: Internal Medicine

## 2020-07-07 ENCOUNTER — Encounter (HOSPITAL_COMMUNITY): Payer: Self-pay | Admitting: Emergency Medicine

## 2020-07-07 ENCOUNTER — Other Ambulatory Visit: Payer: Self-pay

## 2020-07-07 DIAGNOSIS — R531 Weakness: Secondary | ICD-10-CM

## 2020-07-07 DIAGNOSIS — R112 Nausea with vomiting, unspecified: Secondary | ICD-10-CM

## 2020-07-07 DIAGNOSIS — Z66 Do not resuscitate: Secondary | ICD-10-CM | POA: Diagnosis present

## 2020-07-07 DIAGNOSIS — F0391 Unspecified dementia with behavioral disturbance: Secondary | ICD-10-CM | POA: Diagnosis present

## 2020-07-07 DIAGNOSIS — E876 Hypokalemia: Secondary | ICD-10-CM | POA: Diagnosis present

## 2020-07-07 DIAGNOSIS — I130 Hypertensive heart and chronic kidney disease with heart failure and stage 1 through stage 4 chronic kidney disease, or unspecified chronic kidney disease: Secondary | ICD-10-CM | POA: Diagnosis not present

## 2020-07-07 DIAGNOSIS — Z8249 Family history of ischemic heart disease and other diseases of the circulatory system: Secondary | ICD-10-CM

## 2020-07-07 DIAGNOSIS — I5023 Acute on chronic systolic (congestive) heart failure: Secondary | ICD-10-CM | POA: Diagnosis present

## 2020-07-07 DIAGNOSIS — E878 Other disorders of electrolyte and fluid balance, not elsewhere classified: Secondary | ICD-10-CM | POA: Diagnosis present

## 2020-07-07 DIAGNOSIS — Z20822 Contact with and (suspected) exposure to covid-19: Secondary | ICD-10-CM | POA: Diagnosis present

## 2020-07-07 DIAGNOSIS — F0390 Unspecified dementia without behavioral disturbance: Secondary | ICD-10-CM | POA: Diagnosis present

## 2020-07-07 DIAGNOSIS — Z86718 Personal history of other venous thrombosis and embolism: Secondary | ICD-10-CM

## 2020-07-07 DIAGNOSIS — Z87891 Personal history of nicotine dependence: Secondary | ICD-10-CM

## 2020-07-07 DIAGNOSIS — D631 Anemia in chronic kidney disease: Secondary | ICD-10-CM | POA: Diagnosis present

## 2020-07-07 DIAGNOSIS — Z7189 Other specified counseling: Secondary | ICD-10-CM

## 2020-07-07 DIAGNOSIS — R7989 Other specified abnormal findings of blood chemistry: Secondary | ICD-10-CM | POA: Diagnosis present

## 2020-07-07 DIAGNOSIS — Z8719 Personal history of other diseases of the digestive system: Secondary | ICD-10-CM

## 2020-07-07 DIAGNOSIS — M1990 Unspecified osteoarthritis, unspecified site: Secondary | ICD-10-CM | POA: Diagnosis present

## 2020-07-07 DIAGNOSIS — J810 Acute pulmonary edema: Secondary | ICD-10-CM | POA: Diagnosis present

## 2020-07-07 DIAGNOSIS — I161 Hypertensive emergency: Secondary | ICD-10-CM | POA: Diagnosis present

## 2020-07-07 DIAGNOSIS — N1832 Chronic kidney disease, stage 3b: Secondary | ICD-10-CM | POA: Diagnosis present

## 2020-07-07 DIAGNOSIS — I160 Hypertensive urgency: Secondary | ICD-10-CM | POA: Diagnosis present

## 2020-07-07 DIAGNOSIS — N183 Chronic kidney disease, stage 3 unspecified: Secondary | ICD-10-CM | POA: Diagnosis present

## 2020-07-07 DIAGNOSIS — R1110 Vomiting, unspecified: Secondary | ICD-10-CM | POA: Diagnosis not present

## 2020-07-07 DIAGNOSIS — Z23 Encounter for immunization: Secondary | ICD-10-CM

## 2020-07-07 DIAGNOSIS — D649 Anemia, unspecified: Secondary | ICD-10-CM | POA: Diagnosis present

## 2020-07-07 DIAGNOSIS — Z7901 Long term (current) use of anticoagulants: Secondary | ICD-10-CM

## 2020-07-07 DIAGNOSIS — R0602 Shortness of breath: Secondary | ICD-10-CM | POA: Diagnosis not present

## 2020-07-07 DIAGNOSIS — Z79899 Other long term (current) drug therapy: Secondary | ICD-10-CM

## 2020-07-07 DIAGNOSIS — Z90710 Acquired absence of both cervix and uterus: Secondary | ICD-10-CM

## 2020-07-07 LAB — CBC WITH DIFFERENTIAL/PLATELET
Abs Immature Granulocytes: 0.02 10*3/uL (ref 0.00–0.07)
Basophils Absolute: 0 10*3/uL (ref 0.0–0.1)
Basophils Relative: 0 %
Eosinophils Absolute: 0.2 10*3/uL (ref 0.0–0.5)
Eosinophils Relative: 3 %
HCT: 34.1 % — ABNORMAL LOW (ref 36.0–46.0)
Hemoglobin: 10.2 g/dL — ABNORMAL LOW (ref 12.0–15.0)
Immature Granulocytes: 0 %
Lymphocytes Relative: 24 %
Lymphs Abs: 1.5 10*3/uL (ref 0.7–4.0)
MCH: 26 pg (ref 26.0–34.0)
MCHC: 29.9 g/dL — ABNORMAL LOW (ref 30.0–36.0)
MCV: 86.8 fL (ref 80.0–100.0)
Monocytes Absolute: 0.6 10*3/uL (ref 0.1–1.0)
Monocytes Relative: 9 %
Neutro Abs: 4 10*3/uL (ref 1.7–7.7)
Neutrophils Relative %: 64 %
Platelets: 226 10*3/uL (ref 150–400)
RBC: 3.93 MIL/uL (ref 3.87–5.11)
RDW: 17.7 % — ABNORMAL HIGH (ref 11.5–15.5)
WBC: 6.2 10*3/uL (ref 4.0–10.5)
nRBC: 0 % (ref 0.0–0.2)

## 2020-07-07 LAB — COMPREHENSIVE METABOLIC PANEL
ALT: 15 U/L (ref 0–44)
AST: 17 U/L (ref 15–41)
Albumin: 3.5 g/dL (ref 3.5–5.0)
Alkaline Phosphatase: 79 U/L (ref 38–126)
Anion gap: 11 (ref 5–15)
BUN: 16 mg/dL (ref 8–23)
CO2: 24 mmol/L (ref 22–32)
Calcium: 9.4 mg/dL (ref 8.9–10.3)
Chloride: 109 mmol/L (ref 98–111)
Creatinine, Ser: 1.3 mg/dL — ABNORMAL HIGH (ref 0.44–1.00)
GFR, Estimated: 38 mL/min — ABNORMAL LOW (ref >60)
Glucose, Bld: 133 mg/dL — ABNORMAL HIGH (ref 70–99)
Potassium: 4.3 mmol/L (ref 3.5–5.1)
Sodium: 144 mmol/L (ref 135–145)
Total Bilirubin: 0.8 mg/dL (ref 0.3–1.2)
Total Protein: 8.5 g/dL — ABNORMAL HIGH (ref 6.5–8.1)

## 2020-07-07 LAB — RESP PANEL BY RT PCR (RSV, FLU A&B, COVID)
Influenza A by PCR: NEGATIVE
Influenza B by PCR: NEGATIVE
Respiratory Syncytial Virus by PCR: NEGATIVE
SARS Coronavirus 2 by RT PCR: NEGATIVE

## 2020-07-07 LAB — BRAIN NATRIURETIC PEPTIDE: B Natriuretic Peptide: 763.3 pg/mL — ABNORMAL HIGH (ref 0.0–100.0)

## 2020-07-07 MED ORDER — HALOPERIDOL LACTATE 5 MG/ML IJ SOLN: 3.0000 mg | Freq: Once | INTRAMUSCULAR | Status: DC

## 2020-07-07 MED ORDER — FUROSEMIDE 10 MG/ML IJ SOLN
20.0000 mg | Freq: Once | INTRAMUSCULAR | Status: AC
  Administered 2020-07-07: 20 mg via INTRAVENOUS
  Filled 2020-07-07: qty 4

## 2020-07-07 MED ORDER — IOHEXOL 350 MG/ML SOLN
100.0000 mL | Freq: Once | INTRAVENOUS | Status: AC | PRN
  Administered 2020-07-07: 75 mL via INTRAVENOUS

## 2020-07-07 MED ORDER — HALOPERIDOL LACTATE 5 MG/ML IJ SOLN
2.0000 mg | Freq: Once | INTRAMUSCULAR | Status: AC
  Administered 2020-07-07: 2 mg via INTRAVENOUS
  Filled 2020-07-07: qty 1

## 2020-07-07 MED ORDER — NITROGLYCERIN IN D5W 200-5 MCG/ML-% IV SOLN
0.0000 ug/min | INTRAVENOUS | Status: DC
  Administered 2020-07-08: 80 ug/min via INTRAVENOUS
  Administered 2020-07-08: 100 ug/min via INTRAVENOUS
  Administered 2020-07-08: 16.6667 ug/min via INTRAVENOUS
  Administered 2020-07-09: 90 ug/min via INTRAVENOUS
  Administered 2020-07-09: 80 ug/min via INTRAVENOUS
  Administered 2020-07-10: 70 ug/min via INTRAVENOUS
  Filled 2020-07-07 (×6): qty 250

## 2020-07-07 MED ORDER — HYDRALAZINE HCL 20 MG/ML IJ SOLN: 10.0000 mg | Freq: Once | INTRAMUSCULAR | Status: DC

## 2020-07-07 MED ORDER — AEROCHAMBER Z-STAT PLUS/MEDIUM MISC
1.0000 | Freq: Once | Status: DC
  Administered 2020-07-07: 1
  Filled 2020-07-07: qty 1

## 2020-07-07 MED ORDER — ALBUTEROL SULFATE HFA 108 (90 BASE) MCG/ACT IN AERS
6.0000 | INHALATION_SPRAY | Freq: Once | RESPIRATORY_TRACT | Status: DC
  Administered 2020-07-07: 6 via RESPIRATORY_TRACT
  Filled 2020-07-07: qty 6.7

## 2020-07-07 MED ORDER — LORAZEPAM 2 MG/ML IJ SOLN
0.5000 mg | Freq: Once | INTRAMUSCULAR | Status: AC
  Administered 2020-07-07: 0.5 mg via INTRAVENOUS
  Filled 2020-07-07: qty 1

## 2020-07-07 MED ORDER — IPRATROPIUM BROMIDE HFA 17 MCG/ACT IN AERS: 2.0000 | INHALATION_SPRAY | Freq: Once | RESPIRATORY_TRACT | Status: DC

## 2020-07-07 MED ORDER — NITROGLYCERIN 0.4 MG SL SUBL
0.4000 mg | SUBLINGUAL_TABLET | SUBLINGUAL | Status: DC | PRN
  Administered 2020-07-07: 0.4 mg via SUBLINGUAL
  Filled 2020-07-07: qty 1

## 2020-07-07 NOTE — Progress Notes (Signed)
Pt. Just back from CT which was not completed due to agitation, this RT attempted to place BiPAP on pt. after RN gave pt. Haldol but unsuccessful with pt. becoming more agitated/combative, currently has safety mittens on both hands, RN made aware to notify when/if pt. becomes more calm for BiPAP to be attempted to be placed again.

## 2020-07-07 NOTE — ED Provider Notes (Signed)
00:00: Assumed care of patient from [place] Robinson PA-C & Dr. Dalene Seltzer @ change of shift pending CTA & consult for admission.   Please see prior provider note for full H&P.  Briefly patient is a 85 yo female with a hx of hyeprtension, CHF, dementia, and prior DVT not anticoagulated who presented to the ED with complaints of acute onset shortness of breath today. Has had associated wheezing.   Physical Exam  BP (!) 196/105   Pulse 73   Temp 99.2 F (37.3 C) (Oral)   Resp (!) 26   Ht 5' (1.524 m)   Wt 68 kg   SpO2 100%   BMI 29.29 kg/m   Physical Exam Cardiovascular:     Rate and Rhythm: Normal rate and regular rhythm.  Pulmonary:     Comments: Saturating well on 2 L via nasal cannula.  No significant tachypnea.  Decreased breath sounds at the bases.   ED Course/Procedures   Results for orders placed or performed during the hospital encounter of 07/07/20  Resp Panel by RT PCR (RSV, Flu A&B, Covid) - Nasopharyngeal Swab   Specimen: Nasopharyngeal Swab  Result Value Ref Range   SARS Coronavirus 2 by RT PCR NEGATIVE NEGATIVE   Influenza A by PCR NEGATIVE NEGATIVE   Influenza B by PCR NEGATIVE NEGATIVE   Respiratory Syncytial Virus by PCR NEGATIVE NEGATIVE  Comprehensive metabolic panel  Result Value Ref Range   Sodium 144 135 - 145 mmol/L   Potassium 4.3 3.5 - 5.1 mmol/L   Chloride 109 98 - 111 mmol/L   CO2 24 22 - 32 mmol/L   Glucose, Bld 133 (H) 70 - 99 mg/dL   BUN 16 8 - 23 mg/dL   Creatinine, Ser 1.77 (H) 0.44 - 1.00 mg/dL   Calcium 9.4 8.9 - 93.9 mg/dL   Total Protein 8.5 (H) 6.5 - 8.1 g/dL   Albumin 3.5 3.5 - 5.0 g/dL   AST 17 15 - 41 U/L   ALT 15 0 - 44 U/L   Alkaline Phosphatase 79 38 - 126 U/L   Total Bilirubin 0.8 0.3 - 1.2 mg/dL   GFR, Estimated 38 (L) >60 mL/min   Anion gap 11 5 - 15  CBC with Differential  Result Value Ref Range   WBC 6.2 4.0 - 10.5 K/uL   RBC 3.93 3.87 - 5.11 MIL/uL   Hemoglobin 10.2 (L) 12.0 - 15.0 g/dL   HCT 03.0 (L) 36 - 46 %    MCV 86.8 80.0 - 100.0 fL   MCH 26.0 26.0 - 34.0 pg   MCHC 29.9 (L) 30.0 - 36.0 g/dL   RDW 09.2 (H) 33.0 - 07.6 %   Platelets 226 150 - 400 K/uL   nRBC 0.0 0.0 - 0.2 %   Neutrophils Relative % 64 %   Neutro Abs 4.0 1.7 - 7.7 K/uL   Lymphocytes Relative 24 %   Lymphs Abs 1.5 0.7 - 4.0 K/uL   Monocytes Relative 9 %   Monocytes Absolute 0.6 0.1 - 1.0 K/uL   Eosinophils Relative 3 %   Eosinophils Absolute 0.2 0.0 - 0.5 K/uL   Basophils Relative 0 %   Basophils Absolute 0.0 0.0 - 0.1 K/uL   Immature Granulocytes 0 %   Abs Immature Granulocytes 0.02 0.00 - 0.07 K/uL  Brain natriuretic peptide  Result Value Ref Range   B Natriuretic Peptide 763.3 (H) 0.0 - 100.0 pg/mL   DG Chest 2 View  Result Date: 07/07/2020 CLINICAL DATA:  Dyspnea EXAM:  CHEST - 2 VIEW COMPARISON:  01/28/2019 FINDINGS: Lung volumes are extremely small and pulmonary insufflation has diminished when compared to prior examination. Left basilar atelectasis or infiltrate is present. A right hilar opacity has developed, possibly representing a hilar mass or hilar adenopathy. Small right pleural effusion has developed. No definite pneumothorax. Cardiac size within normal limits. The pulmonary vascularity is normal when accounting for poor pulmonary insufflation. IMPRESSION: Interval development of right hilar opacity suspicious for a hilar mass or hilar adenopathy. This would be better assessed with contrast enhanced CT examination if indicated. Marked pulmonary hypoinflation. Superimposed mild left basilar atelectasis or infiltrate. Tiny right pleural effusion. Electronically Signed   By: Helyn Numbers MD   On: 07/07/2020 19:27   CT Angio Chest PE W/Cm &/Or Wo Cm  Result Date: 07/08/2020 CLINICAL DATA:  PE suspected, high prob sudden onset SOB with audible wheezing, hilar adenopathy EXAM: CT ANGIOGRAPHY CHEST WITH CONTRAST TECHNIQUE: Multidetector CT imaging of the chest was performed using the standard protocol during bolus  administration of intravenous contrast. Multiplanar CT image reconstructions and MIPs were obtained to evaluate the vascular anatomy. CONTRAST:  26mL OMNIPAQUE IOHEXOL 350 MG/ML SOLN COMPARISON:  Chest x-ray 07/07/2020, CT abdomen pelvis 11/23/2018, CT abdomen pelvis 01/28/2019. FINDINGS: Cardiovascular: Satisfactory opacification of the pulmonary arteries to the segmental level. No evidence of pulmonary embolism. Left ventricular hypertrophy. Left atrial enlargement. No significant pericardial effusion. Aortic root calcifications. Mild mitral annular calcifications. Mild coronary artery calcifications. Atherosclerotic plaque of the thoracic aorta. Thoracic aorta is normal in caliber. Mediastinum/Nodes: Prominent mediastinal lymph nodes with suggestion of a 1.1 cm subcarinal lymph node (5:43). No enlarged hilar or axillary lymph nodes. Thyroid gland, trachea, and esophagus demonstrate no significant findings. Lungs/Pleura: Expiratory phase of respiration. No pneumothorax. Diffuse peribronchovascular ground-glass and consolidative opacities. Small to moderate right and trace to small left pleural effusions. No pneumothorax. Upper Abdomen: Retrograde reflux of intravenous contrast within the inferior cava and hepatic veins. No acute abnormality. Musculoskeletal: Diffusely decreased bone density. Multilevel severe degenerative changes of the thoracic spine. Limited evaluation for acute fracture. No definite displaced acute fracture. Review of the MIP images confirms the above findings. IMPRESSION: 1. Pulmonary edema with small to moderate right and trace to small left pleural effusion. Superimposed infection/inflammation not excluded. 2. Left ventricular hypertrophy and left atrial enlargement. 3. Diffusely decreased bone density with severe degenerative changes of the spine. Electronically Signed   By: Tish Frederickson M.D.   On: 07/08/2020 02:12    Clinical Course as of Jul 07 2220  Wed Jul 07, 2020  2836 Dr.  Dalene Seltzer evaluating patient.  Nitroglycerin ordered for blood pressure management concern for flash pulmonary edema.   [JR]    Clinical Course User Index [JR] Robinson, [place] N, PA-C    Procedures  MDM   Additional history obtained:  Additional history obtained from chart review:  Last EF 30-35% on echo 11/2017  Patient presentation concerning for acute pulmonary edema to prior team, patient was started on nitroglycerin drip, given Lasix, and plan was for BiPAP.  Her chest x-ray showed suspicious hilar mass or hilar adenopathy, ultimately plan at change of shift is for CT angio-this was attempted once but patient unable to tolerate therefore Haldol and Ativan was ordered per prior team.  Per nursing staff respiratory evaluated for BiPAP, patient was too agitated at that time, she received her Haldol and Ativan with improvement and has returned from CTA, her respiratory rate has significantly improved, she is doing well on her nitroglycerin  drip with improved blood pressures and good 600 cc urine output status post Lasix.  CT angio with findings of pulmonary edema with small to moderate right and trace to small left pleural effusion.  Superimposed infection/inflammation not excluded per radiology.  She has LVH and left atrial enlargement.  02:47: CONSULT: Discussed w/ hospitalist Dr. Toniann Fail- accepts admission.        Cherly Anderson, PA-C 07/08/20 [id number]    Alvira Monday, MD 07/11/20 [id number]

## 2020-07-07 NOTE — ED Provider Notes (Signed)
[facility]-EMERGENCY DEPT Provider Note   CSN: [id number] Arrival date & time: 07/07/20  1808     History No chief complaint on file.   [name] [name] is a 85 y.o. female past medical history of dementia, CKD, CHF, hypertension, chronic left femoral DVT, to the emergency department by daughter for sudden shortness of breath began this morning.  Patient's daughter states about 1 hour prior to arrival she began having sudden wheezing with increased work of breathing.  She has not had any recent illness, has not been coughing or had fever.  Patient states she does not feel ill she just feels short of breath.  She denies chest pain.  Patient's daughter believes her shortness of breath is worse on laying down because she refuses to do so.  She is compliant with her blood pressure medications, had her normal doses this morning.  She states she may have slight worsening of her lower extremity edema from baseline though her left leg is chronically edematous worse than the right.  No known Covid exposures, she is vaccinated against Covid. Patient was at her baseline dementia per daughter.  History provided by: Daughter/caregiver. The history is limited by the condition of the patient.       Past Medical History:  Diagnosis Date  . Arthritis   . Dementia (HCC)   . Dvt femoral (deep venous thrombosis) (HCC)   . Hypertension     Patient Active Problem List   Diagnosis Date Noted  . Upper GI bleed 01/09/2020  . CKD (chronic kidney disease) stage 3, GFR 30-59 ml/min (HCC) 01/09/2020  . DVT (deep venous thrombosis) (HCC) 01/28/2019  . Abdominal pain 01/28/2019  . AKI (acute kidney injury) (HCC) 01/28/2019  . Chronic anemia 01/28/2019  . B12 deficiency 01/28/2019  . DVT, lower extremity (HCC) 01/28/2019  . Memory loss 07/02/2018  . Insomnia 07/02/2018  . Palliative care encounter 07/02/2018  . Hypertensive urgency   . Chest pain 11/23/2017  . Elevated troponin  11/23/2017  . DVT, femoral, chronic (HCC) 11/23/2017  . Acute on chronic renal insufficiency 11/23/2017  . ARF (acute renal failure) (HCC) 09/13/2017  . Dementia (HCC) 09/13/2017  . Essential hypertension 09/13/2017  . Arthritis 09/13/2017  . Chronic back pain 09/13/2017  . Confusion 06/05/2012    Past Surgical History:  Procedure Laterality Date  . ABDOMINAL HYSTERECTOMY    . BACK SURGERY    . BIOPSY  01/10/2020   Procedure: BIOPSY;  Surgeon: Jeani HawkingHung, Patrick, MD;  Location: [facility];  Service: Endoscopy;;  . ESOPHAGOGASTRODUODENOSCOPY (EGD) WITH PROPOFOL N/A 01/10/2020   Procedure: ESOPHAGOGASTRODUODENOSCOPY (EGD) WITH PROPOFOL;  Surgeon: Jeani HawkingHung, Patrick, MD;  Location: [facility];  Service: Endoscopy;  Laterality: N/A;     OB History   No obstetric history on file.     Family History  Problem Relation Age of Onset  . Heart disease Father   . Heart disease Brother     Social History   Tobacco Use  . Smoking status: Former Smoker    Types: Cigarettes    Quit date: 09/25/1976    Years since quitting: 43.8  . Smokeless tobacco: Never Used  Vaping Use  . Vaping Use: Never used  Substance Use Topics  . Alcohol use: No  . Drug use: No    Home Medications Prior to Admission medications   Medication Sig Start Date End Date Taking? Authorizing Provider  acetaminophen-codeine (TYLENOL #3) 300-30 MG tablet Take 1-2 tablets by mouth every 6 (six) hours  as needed for moderate pain.     [provider]  amLODipine (NORVASC) 10 MG tablet Take 1 tablet (10 mg total) by mouth daily. 11/28/17   Joseph Art, DO  atorvastatin (LIPITOR) 20 MG tablet Take 20 mg by mouth daily at 6 PM.     [provider]  Coenzyme Q10 (COQ10) 100 MG CAPS Take 1 tablet by mouth daily.    [provider]  donepezil (ARICEPT) 10 MG tablet Take 10 mg by mouth daily. 12/30/19   [provider]  feeding supplement, ENSURE ENLIVE, (ENSURE ENLIVE) LIQD Take 237 mLs by mouth  2 (two) times daily between meals. Patient taking differently: Take 237 mLs by mouth 3 (three) times daily between meals.  09/15/17   Noralee Stain, DO  lisinopril (PRINIVIL,ZESTRIL) 5 MG tablet Take 7.5 mg by mouth daily.    [provider]  mirtazapine (REMERON) 7.5 MG tablet Take 7.5 mg by mouth at bedtime. 12/06/19   [provider]    Allergies    Patient has no known allergies.  Review of Systems   Review of Systems  Unable to perform ROS: Dementia  Respiratory: Positive for shortness of breath.   Cardiovascular: Positive for leg swelling.    Physical Exam Updated Vital Signs BP (!) 196/105   Pulse 73   Temp 99.2 F (37.3 C) (Oral)   Resp (!) 26   Ht 5' (1.524 m)   Wt 68 kg   SpO2 100%   BMI 29.29 kg/m   Physical Exam Vitals and nursing note reviewed.  Constitutional:      Appearance: She is well-developed. She is diaphoretic.  HENT:     Head: Normocephalic and atraumatic.  Eyes:     Conjunctiva/sclera: Conjunctivae normal.  Cardiovascular:     Rate and Rhythm: Normal rate and regular rhythm.  Pulmonary:     Effort: Respiratory distress present.     Comments: Audible wheezes with prolonged expiratory phase, breath sounds are overall diminished bilaterally.  Increased work of breathing.  Normal O2 saturation Abdominal:     Palpations: Abdomen is soft.  Musculoskeletal:     Right lower leg: Edema present.     Left lower leg: Edema present.     Comments: Bilateral lower extremity edema, left greater than right  Skin:    General: Skin is warm.  Neurological:     Mental Status: She is alert.  Psychiatric:        Behavior: Behavior normal.     ED Results / Procedures / Treatments   Labs (all labs ordered are listed, but only abnormal results are displayed) Labs Reviewed  COMPREHENSIVE METABOLIC PANEL - Abnormal; Notable for the following components:      Result Value   Glucose, Bld 133 (*)    Creatinine, Ser 1.30 (*)    Total Protein  8.5 (*)    GFR, Estimated 38 (*)    All other components within normal limits  CBC WITH DIFFERENTIAL/PLATELET - Abnormal; Notable for the following components:   Hemoglobin 10.2 (*)    HCT 34.1 (*)    MCHC 29.9 (*)    RDW 17.7 (*)    All other components within normal limits  BRAIN NATRIURETIC PEPTIDE - Abnormal; Notable for the following components:   B Natriuretic Peptide 763.3 (*)    All other components within normal limits  RESP PANEL BY RT PCR (RSV, FLU A&B, COVID)    EKG EKG Interpretation  Date/Time:  Wednesday July 07 2020  18:26:09 EDT Ventricular Rate:  69 PR Interval:    QRS Duration: 99 QT Interval:  459 QTC Calculation: 492 R Axis:   63 Text Interpretation: Sinus rhythm Borderline prolonged QT interval 12 Lead; Mason-Likar No significant change since last tracing Confirmed by Alvira Monday ([id number]) on 07/07/2020 7:56:09 PM   Radiology DG Chest 2 View  Result Date: 07/07/2020 CLINICAL DATA:  Dyspnea EXAM: CHEST - 2 VIEW COMPARISON:  01/28/2019 FINDINGS: Lung volumes are extremely small and pulmonary insufflation has diminished when compared to prior examination. Left basilar atelectasis or infiltrate is present. A right hilar opacity has developed, possibly representing a hilar mass or hilar adenopathy. Small right pleural effusion has developed. No definite pneumothorax. Cardiac size within normal limits. The pulmonary vascularity is normal when accounting for poor pulmonary insufflation. IMPRESSION: Interval development of right hilar opacity suspicious for a hilar mass or hilar adenopathy. This would be better assessed with contrast enhanced CT examination if indicated. Marked pulmonary hypoinflation. Superimposed mild left basilar atelectasis or infiltrate. Tiny right pleural effusion. Electronically Signed   By: Helyn Numbers MD   On: 07/07/2020 19:27    Procedures .Critical Care Performed by: Oren Barella, [place] N, PA-C Authorized by: Hardie Veltre, [place] N,  PA-C   Critical care provider statement:    Critical care time (minutes):  60   Critical care time was exclusive of:  Teaching time and separately billable procedures and treating other patients   Critical care was necessary to treat or prevent imminent or life-threatening deterioration of the following conditions:  Respiratory failure (hypertensive emergency)   Critical care was time spent personally by me on the following activities:  Discussions with consultants, evaluation of patient's response to treatment, examination of patient, ordering and performing treatments and interventions, ordering and review of laboratory studies, ordering and review of radiographic studies, pulse oximetry, re-evaluation of patient's condition, obtaining history from patient or surrogate and review of old charts   I assumed direction of critical care for this patient from another provider in my specialty: no     (including critical care time)  Medications Ordered in ED Medications  nitroGLYCERIN (NITROSTAT) SL tablet 0.4 mg (0.4 mg Sublingual Given 07/07/20 2114)  LORazepam (ATIVAN) injection 0.5 mg (has no administration in time range)  haloperidol lactate (HALDOL) injection 2 mg (has no administration in time range)  nitroGLYCERIN 50 mg in dextrose 5 % 250 mL (0.2 mg/mL) infusion (has no administration in time range)  furosemide (LASIX) injection 20 mg (has no administration in time range)  iohexol (OMNIPAQUE) 350 MG/ML injection 100 mL (75 mLs Intravenous Contrast Given 07/07/20 2127)  haloperidol lactate (HALDOL) injection 2 mg (2 mg Intravenous Given 07/07/20 2155)    ED Course  I have reviewed the triage vital signs and the nursing notes.  Pertinent labs & imaging results that were available during my care of the patient were reviewed by me and considered in my medical decision making (see chart for details).  Clinical Course as of Jul 07 2300  Wed Jul 07, 2020  1027 Dr. Dalene Seltzer evaluating  patient.  Nitroglycerin ordered for blood pressure management concern for flash pulmonary edema.   [JR]    Clinical Course User Index [JR] Jose Corvin, [place] N, PA-C   MDM Rules/Calculators/A&P                          Patient with history of CHF, hypertension, brought into the ED by daughter for sudden onset of shortness of  breath this morning followed by sudden onset of wheezing about an hour prior to arrival.  She has known history of CHF, may have some increased edema in her lower extremities from baseline.  She has had no recent illness, has not been coughing or febrile.  She is at her mental baseline per patient.  On evaluation however patient is in respiratory distress with increased work of breathing, audible wheezing.  O2 saturation is excellent on room air, however patient placed on 2 L nasal cannula supplementation.  She is also significantly hypertensive at 204/101.  Given patient presentation, concerned for flash pulmonary edema in the setting of hypertensive emergency versus the ED versus respiratory illness.  Dr. Dalene Seltzer, attending provider, alerted of patient's concerning presentation and evaluated patient at bedside.  Will order sublingual nitroglycerin for blood pressure management, while awaiting chest x-ray and laboratory work-up. Bipap ordered by Dr. Dalene Seltzer for respiratory distress.  Covid swab is negative.  Chest x-ray with development of right hilar opacity. Discussed with attending provider.  Will obtain CTA of the chest to evaluate for PE, which will also better assess patient's abnormal findings on chest x-ray.  Labs reveal normal white blood cell count, BNP slightly elevated at 760. Metabolic panel unremarkable for acute significant findings. COVID negative.  Care assumed at shift change BY Dr. Dalene Seltzer pending CTA. Pt will require admission for acute respiratory failure.  Final Clinical Impression(s) / ED Diagnoses Final diagnoses:  Hypertensive emergency  Acute  pulmonary edema [facility])    Rx / DC Orders ED Discharge Orders    None       Ishaaq Penna, [place] N, PA-C 07/07/20 2301    Alvira Monday, MD 07/10/20 [phone]

## 2020-07-07 NOTE — ED Triage Notes (Signed)
Patient BIB daughter, her daughter noticed SOB this morning, getting worse in the last hour, auditory inspiratory wheezes heard. Hx of CHF.

## 2020-07-07 NOTE — ED Notes (Signed)
Pt transported to xray 

## 2020-07-08 ENCOUNTER — Emergency Department (HOSPITAL_COMMUNITY): Payer: Medicare Other

## 2020-07-08 ENCOUNTER — Other Ambulatory Visit (HOSPITAL_COMMUNITY): Payer: Medicare Other

## 2020-07-08 ENCOUNTER — Encounter (HOSPITAL_COMMUNITY): Payer: Self-pay

## 2020-07-08 DIAGNOSIS — Z20822 Contact with and (suspected) exposure to covid-19: Secondary | ICD-10-CM | POA: Diagnosis present

## 2020-07-08 DIAGNOSIS — N1832 Chronic kidney disease, stage 3b: Secondary | ICD-10-CM | POA: Diagnosis present

## 2020-07-08 DIAGNOSIS — R531 Weakness: Secondary | ICD-10-CM | POA: Diagnosis present

## 2020-07-08 DIAGNOSIS — E876 Hypokalemia: Secondary | ICD-10-CM | POA: Diagnosis present

## 2020-07-08 DIAGNOSIS — Z87891 Personal history of nicotine dependence: Secondary | ICD-10-CM | POA: Diagnosis not present

## 2020-07-08 DIAGNOSIS — F0391 Unspecified dementia with behavioral disturbance: Secondary | ICD-10-CM | POA: Diagnosis present

## 2020-07-08 DIAGNOSIS — D631 Anemia in chronic kidney disease: Secondary | ICD-10-CM | POA: Diagnosis present

## 2020-07-08 DIAGNOSIS — I161 Hypertensive emergency: Secondary | ICD-10-CM | POA: Insufficient documentation

## 2020-07-08 DIAGNOSIS — E878 Other disorders of electrolyte and fluid balance, not elsewhere classified: Secondary | ICD-10-CM | POA: Diagnosis present

## 2020-07-08 DIAGNOSIS — R7989 Other specified abnormal findings of blood chemistry: Secondary | ICD-10-CM | POA: Diagnosis present

## 2020-07-08 DIAGNOSIS — R0602 Shortness of breath: Secondary | ICD-10-CM | POA: Diagnosis present

## 2020-07-08 DIAGNOSIS — Z90710 Acquired absence of both cervix and uterus: Secondary | ICD-10-CM | POA: Diagnosis not present

## 2020-07-08 DIAGNOSIS — Z79899 Other long term (current) drug therapy: Secondary | ICD-10-CM | POA: Diagnosis not present

## 2020-07-08 DIAGNOSIS — J810 Acute pulmonary edema: Secondary | ICD-10-CM

## 2020-07-08 DIAGNOSIS — I5023 Acute on chronic systolic (congestive) heart failure: Secondary | ICD-10-CM | POA: Diagnosis present

## 2020-07-08 DIAGNOSIS — Z86718 Personal history of other venous thrombosis and embolism: Secondary | ICD-10-CM | POA: Diagnosis not present

## 2020-07-08 DIAGNOSIS — Z66 Do not resuscitate: Secondary | ICD-10-CM | POA: Diagnosis present

## 2020-07-08 DIAGNOSIS — Z8719 Personal history of other diseases of the digestive system: Secondary | ICD-10-CM | POA: Diagnosis not present

## 2020-07-08 DIAGNOSIS — Z8249 Family history of ischemic heart disease and other diseases of the circulatory system: Secondary | ICD-10-CM | POA: Diagnosis not present

## 2020-07-08 DIAGNOSIS — M1990 Unspecified osteoarthritis, unspecified site: Secondary | ICD-10-CM | POA: Diagnosis present

## 2020-07-08 DIAGNOSIS — Z7189 Other specified counseling: Secondary | ICD-10-CM | POA: Diagnosis not present

## 2020-07-08 DIAGNOSIS — R1110 Vomiting, unspecified: Secondary | ICD-10-CM | POA: Diagnosis not present

## 2020-07-08 DIAGNOSIS — Z23 Encounter for immunization: Secondary | ICD-10-CM | POA: Diagnosis present

## 2020-07-08 DIAGNOSIS — I130 Hypertensive heart and chronic kidney disease with heart failure and stage 1 through stage 4 chronic kidney disease, or unspecified chronic kidney disease: Secondary | ICD-10-CM | POA: Diagnosis present

## 2020-07-08 DIAGNOSIS — Z7901 Long term (current) use of anticoagulants: Secondary | ICD-10-CM | POA: Diagnosis not present

## 2020-07-08 DIAGNOSIS — Z515 Encounter for palliative care: Secondary | ICD-10-CM | POA: Diagnosis not present

## 2020-07-08 LAB — TROPONIN I (HIGH SENSITIVITY)
Troponin I (High Sensitivity): 133 ng/L (ref <18)
Troponin I (High Sensitivity): 150 ng/L (ref <18)

## 2020-07-08 LAB — COMPREHENSIVE METABOLIC PANEL
ALT: 13 U/L (ref 0–44)
AST: 22 U/L (ref 15–41)
Albumin: 3.4 g/dL — ABNORMAL LOW (ref 3.5–5.0)
Alkaline Phosphatase: 77 U/L (ref 38–126)
Anion gap: 9 (ref 5–15)
BUN: 18 mg/dL (ref 8–23)
CO2: 24 mmol/L (ref 22–32)
Calcium: 8.6 mg/dL — ABNORMAL LOW (ref 8.9–10.3)
Chloride: 106 mmol/L (ref 98–111)
Creatinine, Ser: 1.28 mg/dL — ABNORMAL HIGH (ref 0.44–1.00)
GFR, Estimated: 39 mL/min — ABNORMAL LOW (ref >60)
Glucose, Bld: 139 mg/dL — ABNORMAL HIGH (ref 70–99)
Potassium: 4.4 mmol/L (ref 3.5–5.1)
Sodium: 139 mmol/L (ref 135–145)
Total Bilirubin: 0.6 mg/dL (ref 0.3–1.2)
Total Protein: 7.8 g/dL (ref 6.5–8.1)

## 2020-07-08 LAB — CBC WITH DIFFERENTIAL/PLATELET
Abs Immature Granulocytes: 0.02 10*3/uL (ref 0.00–0.07)
Basophils Absolute: 0 10*3/uL (ref 0.0–0.1)
Basophils Relative: 0 %
Eosinophils Absolute: 0 10*3/uL (ref 0.0–0.5)
Eosinophils Relative: 0 %
HCT: 32.5 % — ABNORMAL LOW (ref 36.0–46.0)
Hemoglobin: 9.8 g/dL — ABNORMAL LOW (ref 12.0–15.0)
Immature Granulocytes: 0 %
Lymphocytes Relative: 17 %
Lymphs Abs: 1.6 10*3/uL (ref 0.7–4.0)
MCH: 25.9 pg — ABNORMAL LOW (ref 26.0–34.0)
MCHC: 30.2 g/dL (ref 30.0–36.0)
MCV: 85.8 fL (ref 80.0–100.0)
Monocytes Absolute: 0.7 10*3/uL (ref 0.1–1.0)
Monocytes Relative: 8 %
Neutro Abs: 7.1 10*3/uL (ref 1.7–7.7)
Neutrophils Relative %: 75 %
Platelets: 230 10*3/uL (ref 150–400)
RBC: 3.79 MIL/uL — ABNORMAL LOW (ref 3.87–5.11)
RDW: 17.5 % — ABNORMAL HIGH (ref 11.5–15.5)
WBC: 9.4 10*3/uL (ref 4.0–10.5)
nRBC: 0 % (ref 0.0–0.2)

## 2020-07-08 LAB — TSH: TSH: 3.475 u[IU]/mL (ref 0.350–4.500)

## 2020-07-08 LAB — MRSA PCR SCREENING: MRSA by PCR: NEGATIVE

## 2020-07-08 MED ORDER — FUROSEMIDE 10 MG/ML IJ SOLN
20.0000 mg | Freq: Two times a day (BID) | INTRAMUSCULAR | Status: DC
  Administered 2020-07-08 – 2020-07-11 (×8): 20 mg via INTRAVENOUS
  Filled 2020-07-08 (×8): qty 2

## 2020-07-08 MED ORDER — AMLODIPINE BESYLATE 10 MG PO TABS
10.0000 mg | ORAL_TABLET | Freq: Every day | ORAL | Status: DC
  Administered 2020-07-08 – 2020-07-13 (×6): 10 mg via ORAL
  Filled 2020-07-08: qty 1
  Filled 2020-07-08: qty 2
  Filled 2020-07-08 (×4): qty 1

## 2020-07-08 MED ORDER — PROCHLORPERAZINE EDISYLATE 10 MG/2ML IJ SOLN
10.0000 mg | Freq: Four times a day (QID) | INTRAMUSCULAR | Status: DC | PRN
  Administered 2020-07-08: 10 mg via INTRAVENOUS
  Filled 2020-07-08: qty 2

## 2020-07-08 MED ORDER — CHLORHEXIDINE GLUCONATE CLOTH 2 % EX PADS
6.0000 | MEDICATED_PAD | Freq: Every day | CUTANEOUS | Status: DC
  Administered 2020-07-08 – 2020-07-13 (×6): 6 via TOPICAL

## 2020-07-08 MED ORDER — ATORVASTATIN CALCIUM 10 MG PO TABS
20.0000 mg | ORAL_TABLET | Freq: Every day | ORAL | Status: DC
  Administered 2020-07-08 – 2020-07-12 (×4): 20 mg via ORAL
  Filled 2020-07-08 (×4): qty 2

## 2020-07-08 MED ORDER — APIXABAN 2.5 MG PO TABS
2.5000 mg | ORAL_TABLET | Freq: Two times a day (BID) | ORAL | Status: DC
  Administered 2020-07-08 – 2020-07-13 (×11): 2.5 mg via ORAL
  Filled 2020-07-08 (×12): qty 1

## 2020-07-08 MED ORDER — MIRTAZAPINE 15 MG PO TABS
7.5000 mg | ORAL_TABLET | Freq: Every day | ORAL | Status: DC
  Administered 2020-07-08 – 2020-07-12 (×5): 7.5 mg via ORAL
  Filled 2020-07-08 (×5): qty 1

## 2020-07-08 MED ORDER — ACETAMINOPHEN 325 MG PO TABS: 650.0000 mg | ORAL_TABLET | Freq: Four times a day (QID) | ORAL | Status: DC | PRN

## 2020-07-08 MED ORDER — ORAL CARE MOUTH RINSE
15.0000 mL | Freq: Two times a day (BID) | OROMUCOSAL | Status: DC
  Administered 2020-07-08 – 2020-07-13 (×9): 15 mL via OROMUCOSAL

## 2020-07-08 MED ORDER — LISINOPRIL 2.5 MG PO TABS
7.5000 mg | ORAL_TABLET | Freq: Every day | ORAL | Status: DC
  Administered 2020-07-08: 7.5 mg via ORAL
  Filled 2020-07-08: qty 1

## 2020-07-08 MED ORDER — INFLUENZA VAC A&B SA ADJ QUAD 0.5 ML IM PRSY
0.5000 mL | PREFILLED_SYRINGE | INTRAMUSCULAR | Status: AC
  Administered 2020-07-12: 0.5 mL via INTRAMUSCULAR
  Filled 2020-07-08: qty 0.5

## 2020-07-08 MED ORDER — LABETALOL HCL 5 MG/ML IV SOLN: 5.0000 mg | Freq: Three times a day (TID) | INTRAVENOUS | Status: DC

## 2020-07-08 MED ORDER — METOPROLOL TARTRATE 5 MG/5ML IV SOLN
2.5000 mg | Freq: Four times a day (QID) | INTRAVENOUS | Status: DC
  Administered 2020-07-08 – 2020-07-10 (×7): 2.5 mg via INTRAVENOUS
  Filled 2020-07-08 (×7): qty 5

## 2020-07-08 MED ORDER — DONEPEZIL HCL 10 MG PO TABS
10.0000 mg | ORAL_TABLET | Freq: Every day | ORAL | Status: DC
  Administered 2020-07-08 – 2020-07-13 (×4): 10 mg via ORAL
  Filled 2020-07-08 (×4): qty 1

## 2020-07-08 MED ORDER — CARVEDILOL 3.125 MG PO TABS
3.1250 mg | ORAL_TABLET | Freq: Two times a day (BID) | ORAL | Status: DC
  Administered 2020-07-08: 3.125 mg via ORAL
  Filled 2020-07-08 (×2): qty 1

## 2020-07-08 MED ORDER — ACETAMINOPHEN 650 MG RE SUPP: 650.0000 mg | Freq: Four times a day (QID) | RECTAL | Status: DC | PRN

## 2020-07-08 NOTE — H&P (Signed)
History and Physical    [name] GammonSusie W Kratzke UJW:[id number] DOB: 03-Sep-1935 DOA: 07/07/2020  PCP: Andi DevonShelton, Kimberly, MD    Patient coming from: Home.  History obtained from patient's daughter as patient has dementia.  Chief Complaint: Shortness of breath.  HPI: [name] [name] is a 85 y.o. 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  Did not have any chest pain no fever chills or productive cough.  ED Course: In the ER patient was found to be hypertensive with systolic blood pressure more than 200 and CT angiogram of the chest shows pulmonary edema.  Labs are significant for BNP of 763 creatinine 1.3 hemoglobin 10.2 Covid test negative.  EKG shows normal sinus rhythm.  Patient was given Lasix 20 mg IV and also was placed on nitroglycerin infusion for blood pressure control.  Patient was slightly agitated in the ER and had to be given Haldol.  Review of Systems: As per HPI, rest all negative.   Past Medical History:  Diagnosis Date  . Arthritis   . Dementia (HCC)   . Dvt femoral (deep venous thrombosis) (HCC)   . Hypertension     Past Surgical History:  Procedure Laterality Date  . ABDOMINAL HYSTERECTOMY    . BACK SURGERY    . BIOPSY  01/10/2020   Procedure: BIOPSY;  Surgeon: Jeani HawkingHung, Patrick, MD;  Location: [facility];  Service: Endoscopy;;  . ESOPHAGOGASTRODUODENOSCOPY (EGD) WITH PROPOFOL N/A 01/10/2020   Procedure: ESOPHAGOGASTRODUODENOSCOPY (EGD) WITH PROPOFOL;  Surgeon: Jeani HawkingHung, Patrick, MD;  Location: [facility];  Service: Endoscopy;  Laterality: N/A;     reports that she quit smoking about 43 years ago. Her smoking use included cigarettes. She has never used smokeless tobacco. She reports that she does not drink alcohol and does not use drugs.  No  Known Allergies  Family History  Problem Relation Age of Onset  . Heart disease Father   . Heart disease Brother     Prior to Admission medications   Medication Sig Start Date End Date Taking? Authorizing Provider  acetaminophen-codeine (TYLENOL #3) 300-30 MG tablet Take 1-2 tablets by mouth every 6 (six) hours as needed for moderate pain.    Yes [provider]  amLODipine (NORVASC) 10 MG tablet Take 1 tablet (10 mg total) by mouth daily. 11/28/17  Yes Joseph ArtVann, Jessica U, DO  apixaban (ELIQUIS) 5 MG TABS tablet Take 2.5 mg by mouth 2 (two) times daily.   Yes [provider]  atorvastatin (LIPITOR) 20 MG tablet Take 20 mg by mouth daily at 6 PM.    Yes [provider]  Coenzyme Q10 (COQ10) 100 MG CAPS Take 1 tablet by mouth daily.   Yes [provider]  donepezil (ARICEPT) 10 MG tablet Take 10 mg by mouth daily. 12/30/19  Yes [provider]  feeding supplement, ENSURE ENLIVE, (ENSURE ENLIVE) LIQD Take 237 mLs by mouth 2 (two) times daily between meals. Patient taking differently: Take 237 mLs by mouth 3 (three) times daily between meals.  09/15/17  Yes Noralee Stainhoi, Jennifer, DO  lisinopril (PRINIVIL,ZESTRIL) 5 MG tablet Take 7.5 mg by mouth daily.   Yes [provider]  mirtazapine (REMERON) 7.5 MG tablet Take 7.5 mg by mouth at bedtime. 12/06/19  Yes [provider]    Physical Exam:  Constitutional: Moderately built and nourished. Vitals:   07/08/20 0115 07/08/20 0200 07/08/20 0248 07/08/20 0500  BP: (!) 165/98 (!) 167/91 (!) 169/95 (!) 164/93  Pulse: 74 73 78 77  Resp: 20 (!) 36 17 (!) 21  Temp:      TempSrc:      SpO2: 96% 96% 96% 99%  Weight:      Height:       Eyes: Anicteric no pallor. ENMT: No discharge from the ears eyes nose or mouth. Neck: No mass felt.  No neck rigidity.  JVD elevated. Respiratory: No rhonchi or crepitations. Cardiovascular: S1-S2 heard. Abdomen: Soft nontender bowel sounds present. Musculoskeletal:  No edema. Skin: No rash. Neurologic: Alert awake but does not follow commands. Psychiatric: Has dementia.   Labs on Admission: I have personally reviewed following labs and imaging studies  CBC: Recent Labs  Lab 07/07/20 1931  WBC 6.2  NEUTROABS 4.0  HGB 10.2*  HCT 34.1*  MCV 86.8  PLT 226   Basic Metabolic Panel: Recent Labs  Lab 07/07/20 1931  NA 144  K 4.3  CL 109  CO2 24  GLUCOSE 133*  BUN 16  CREATININE 1.30*  CALCIUM 9.4   GFR: Estimated Creatinine Clearance: 28.2 mL/min (A) (by C-G formula based on SCr of 1.3 mg/dL (H)). Liver Function Tests: Recent Labs  Lab 07/07/20 1931  AST 17  ALT 15  ALKPHOS 79  BILITOT 0.8  PROT 8.5*  ALBUMIN 3.5   No results for input(s): LIPASE, AMYLASE in the last 168 hours. No results for input(s): AMMONIA in the last 168 hours. Coagulation Profile: No results for input(s): INR, PROTIME in the last 168 hours. Cardiac Enzymes: No results for input(s): CKTOTAL, CKMB, CKMBINDEX, TROPONINI in the last 168 hours. BNP (last 3 results) No results for input(s): PROBNP in the last 8760 hours. HbA1C: No results for input(s): HGBA1C in the last 72 hours. CBG: No results for input(s): GLUCAP in the last 168 hours. Lipid Profile: No results for input(s): CHOL, HDL, LDLCALC, TRIG, CHOLHDL, LDLDIRECT in the last 72 hours. Thyroid Function Tests: No results for input(s): TSH, T4TOTAL, FREET4, T3FREE, THYROIDAB in the last 72 hours. Anemia Panel: No results for input(s): VITAMINB12, FOLATE, FERRITIN, TIBC, IRON, RETICCTPCT in the last 72 hours. Urine analysis:    Component Value Date/Time   COLORURINE YELLOW 01/29/2019 0630   APPEARANCEUR HAZY (A) 01/29/2019 0630   LABSPEC 1.011 01/29/2019 0630   PHURINE 6.0 01/29/2019 0630   GLUCOSEU NEGATIVE 01/29/2019 0630   HGBUR NEGATIVE 01/29/2019 0630   BILIRUBINUR NEGATIVE 01/29/2019 0630   KETONESUR NEGATIVE 01/29/2019 0630   PROTEINUR 30 (A) 01/29/2019 0630   UROBILINOGEN 0.2  11/23/2018 1422   NITRITE NEGATIVE 01/29/2019 0630   LEUKOCYTESUR MODERATE (A) 01/29/2019 0630   Sepsis Labs: @LABRCNTIP (procalcitonin:4,lacticidven:4) ) Recent Results (from the past 240 hour(s))  Resp Panel by RT PCR (RSV, Flu A&B, Covid) - Nasopharyngeal Swab     Status: None   Collection Time: 07/07/20  7:31 PM   Specimen: Nasopharyngeal Swab  Result Value Ref Range Status   SARS Coronavirus 2 by RT PCR NEGATIVE NEGATIVE Final    Comment: (NOTE) SARS-CoV-2 target nucleic acids are NOT DETECTED.  The SARS-CoV-2 RNA is generally detectable in upper respiratoy specimens during the acute phase of infection. The lowest concentration of SARS-CoV-2 viral copies this assay can detect is 131 copies/mL. A negative result does not preclude SARS-Cov-2 infection and should not be used as the sole basis for treatment or other patient management  decisions. A negative result may occur with  improper specimen collection/handling, submission of specimen other than nasopharyngeal swab, presence of viral mutation(s) within the areas targeted by this assay, and inadequate number of viral copies (<131 copies/mL). A negative result must be combined with clinical observations, patient history, and epidemiological information. The expected result is Negative.  Fact Sheet for Patients:  https://www.moore.com/  Fact Sheet for Healthcare Providers:  https://www.young.biz/  This test is no t yet approved or cleared by the [place] FDA and  has been authorized for detection and/or diagnosis of SARS-CoV-2 by FDA under an Emergency Use Authorization (EUA). This EUA will remain  in effect (meaning this test can be used) for the duration of the COVID-19 declaration under Section 564(b)(1) of the Act, 21 U.S.C. section 360bbb-3(b)(1), unless the authorization is terminated or revoked sooner.     Influenza A by PCR NEGATIVE NEGATIVE Final   Influenza B by PCR  NEGATIVE NEGATIVE Final    Comment: (NOTE) The Xpert Xpress SARS-CoV-2/FLU/RSV assay is intended as an aid in  the diagnosis of influenza from Nasopharyngeal swab specimens and  should not be used as a sole basis for treatment. Nasal washings and  aspirates are unacceptable for Xpert Xpress SARS-CoV-2/FLU/RSV  testing.  Fact Sheet for Patients: https://www.moore.com/  Fact Sheet for Healthcare Providers: https://www.young.biz/  This test is not yet approved or cleared by the [place] FDA and  has been authorized for detection and/or diagnosis of SARS-CoV-2 by  FDA under an Emergency Use Authorization (EUA). This EUA will remain  in effect (meaning this test can be used) for the duration of the  Covid-19 declaration under Section 564(b)(1) of the Act, 21  U.S.C. section 360bbb-3(b)(1), unless the authorization is  terminated or revoked.    Respiratory Syncytial Virus by PCR NEGATIVE NEGATIVE Final    Comment: (NOTE) Fact Sheet for Patients: https://www.moore.com/  Fact Sheet for Healthcare Providers: https://www.young.biz/  This test is not yet approved or cleared by the [place] FDA and  has been authorized for detection and/or diagnosis of SARS-CoV-2 by  FDA under an Emergency Use Authorization (EUA). This EUA will remain  in effect (meaning this test can be used) for the duration of the  COVID-19 declaration under Section 564(b)(1) of the Act, 21 U.S.C.  section 360bbb-3(b)(1), unless the authorization is terminated or  revoked. Performed at [facility], [address] [place], [place] [address]      Radiological Exams on Admission: DG Chest 2 View  Result Date: 07/07/2020 CLINICAL DATA:  Dyspnea EXAM: CHEST - 2 VIEW COMPARISON:  01/28/2019 FINDINGS: Lung volumes are extremely small and pulmonary insufflation has diminished when compared to prior examination. Left  basilar atelectasis or infiltrate is present. A right hilar opacity has developed, possibly representing a hilar mass or hilar adenopathy. Small right pleural effusion has developed. No definite pneumothorax. Cardiac size within normal limits. The pulmonary vascularity is normal when accounting for poor pulmonary insufflation. IMPRESSION: Interval development of right hilar opacity suspicious for a hilar mass or hilar adenopathy. This would be better assessed with contrast enhanced CT examination if indicated. Marked pulmonary hypoinflation. Superimposed mild left basilar atelectasis or infiltrate. Tiny right pleural effusion. Electronically Signed   By: Helyn Numbers MD   On: 07/07/2020 19:27   CT Angio Chest PE W/Cm &/Or Wo Cm  Result Date: 07/08/2020 CLINICAL DATA:  PE suspected, high prob sudden onset SOB with audible wheezing, hilar adenopathy EXAM: CT ANGIOGRAPHY CHEST WITH CONTRAST TECHNIQUE: Multidetector  CT imaging of the chest was performed using the standard protocol during bolus administration of intravenous contrast. Multiplanar CT image reconstructions and MIPs were obtained to evaluate the vascular anatomy. CONTRAST:  35mL OMNIPAQUE IOHEXOL 350 MG/ML SOLN COMPARISON:  Chest x-ray 07/07/2020, CT abdomen pelvis 11/23/2018, CT abdomen pelvis 01/28/2019. FINDINGS: Cardiovascular: Satisfactory opacification of the pulmonary arteries to the segmental level. No evidence of pulmonary embolism. Left ventricular hypertrophy. Left atrial enlargement. No significant pericardial effusion. Aortic root calcifications. Mild mitral annular calcifications. Mild coronary artery calcifications. Atherosclerotic plaque of the thoracic aorta. Thoracic aorta is normal in caliber. Mediastinum/Nodes: Prominent mediastinal lymph nodes with suggestion of a 1.1 cm subcarinal lymph node (5:43). No enlarged hilar or axillary lymph nodes. Thyroid gland, trachea, and esophagus demonstrate no significant findings. Lungs/Pleura:  Expiratory phase of respiration. No pneumothorax. Diffuse peribronchovascular ground-glass and consolidative opacities. Small to moderate right and trace to small left pleural effusions. No pneumothorax. Upper Abdomen: Retrograde reflux of intravenous contrast within the inferior cava and hepatic veins. No acute abnormality. Musculoskeletal: Diffusely decreased bone density. Multilevel severe degenerative changes of the thoracic spine. Limited evaluation for acute fracture. No definite displaced acute fracture. Review of the MIP images confirms the above findings. IMPRESSION: 1. Pulmonary edema with small to moderate right and trace to small left pleural effusion. Superimposed infection/inflammation not excluded. 2. Left ventricular hypertrophy and left atrial enlargement. 3. Diffusely decreased bone density with severe degenerative changes of the spine. Electronically Signed   By: Tish Frederickson M.D.   On: 07/08/2020 02:12    EKG: Independently reviewed.  Normal sinus rhythm.  Assessment/Plan Principal Problem:   Acute pulmonary edema (HCC) Active Problems:   Dementia (HCC)   Hypertensive urgency   Chronic anemia   CKD (chronic kidney disease) stage 3, GFR 30-59 ml/min (HCC)    1. Acute pulmonary edema secondary to acute decompensated systolic CHF last EF measured was 30 to 35% in 2019.  Presently on nitroglycerin infusion and Lasix 20 mg IV every 12 on lisinopril.  Follow intake output metabolic panel and daily weights.  Troponin is pending.  Note that patient has chronic renal disease and if creatinine worsens may need to hold lisinopril. 2. Hypertensive urgency likely contributing to patient's symptoms.  Patient is on amlodipine lisinopril.  Also nitroglycerin infusion.  We will try to wean off patient's nitroglycerin infusion of the patient getting her home dose of lisinopril and amlodipine.  Follow blood pressure trends. 3. Recurrent DVT on apixaban. 4. Chronic kidney disease stage III  creatinine appears to be at baseline.  Note that patient is on Lasix and also on lisinopril.  If creatinine worsens may have to hold lisinopril. 5. Chronic anemia follow CBC. 6. History of GI bleed in early part of this year. 7. Dementia with behavioral disturbances on Aricept.  Had required to be given Haldol because of agitation.  Since patient has CHF with hypertensive urgency will need close monitoring for any further worsening in inpatient status.   DVT prophylaxis: Apixaban. Code Status: DNR confirmed with patient's daughter. Family Communication: Patient's daughter. Disposition Plan: Home when stable. Consults called: None. Admission status: Inpatient.   Eduard Clos MD [facility] Pager [phone].  If 7PM-7AM, please contact night-coverage [email or web address] Password [email or web address]  07/08/2020, 5:42 AM

## 2020-07-08 NOTE — Progress Notes (Signed)
Patient admitted early this morning for hypertension urgency and acute heart failure, she is tolerating nitro drips and IV Lasix, she has advanced dementia, but pleasantly confused, does not appear in acute distress. Family updated at bedside and over the phone.

## 2020-07-08 NOTE — Progress Notes (Signed)
RT in to see pt. again for possible BiPAP placement after prior attempt was unsuccessful due to pt. being agitated, (see prior progress note) daughter remains at bedside, pt. remains restless and would not be able to be adequately ventilated due to current status, BP noted to be <'d from earlier reading, RT to monitor.

## 2020-07-08 NOTE — Progress Notes (Signed)
CRITICAL VALUE ALERT  Critical Value:  Troponin 133  Date & Time Notied:  07/08/2020 0840  Provider Notified: Albertine Grates  Orders Received/Actions taken: None at this time.

## 2020-07-08 NOTE — Progress Notes (Signed)
CRITICAL VALUE ALERT  Critical Value:  Troponin 150  Date & Time Notied:  07/08/2020 10:10  Provider Notified: Albertine Grates  Orders Received/Actions taken: None at this time.

## 2020-07-08 NOTE — ED Notes (Signed)
Increased nitroglycerin drip to 70mcg/min

## 2020-07-08 NOTE — Progress Notes (Signed)
Multiple attempts to give morning medications PO. Patient refusing to take pills, water, or food by mouth at this time. Refuses to answer to this RN and both daughter at bedside and per phone encouraging medications. MD Albertine Grates notified. Will continue to encourage.

## 2020-07-08 NOTE — ED Notes (Signed)
Increased nitroglycerin drip to 67mcg/min.

## 2020-07-08 NOTE — Progress Notes (Signed)
RT Note: Pt. seen for follow up, currently on n/c, Daughter remains @ bedside, held head up upon my entering room, BiPAP remains if/when pt. able to tolerate if needed for >'d WOB.

## 2020-07-09 ENCOUNTER — Inpatient Hospital Stay (HOSPITAL_COMMUNITY): Payer: Medicare Other

## 2020-07-09 DIAGNOSIS — Z7189 Other specified counseling: Secondary | ICD-10-CM | POA: Diagnosis not present

## 2020-07-09 DIAGNOSIS — Z515 Encounter for palliative care: Secondary | ICD-10-CM | POA: Diagnosis not present

## 2020-07-09 DIAGNOSIS — R531 Weakness: Secondary | ICD-10-CM | POA: Diagnosis not present

## 2020-07-09 DIAGNOSIS — F0390 Unspecified dementia without behavioral disturbance: Secondary | ICD-10-CM

## 2020-07-09 DIAGNOSIS — J810 Acute pulmonary edema: Secondary | ICD-10-CM | POA: Diagnosis not present

## 2020-07-09 LAB — CBC WITH DIFFERENTIAL/PLATELET
Abs Immature Granulocytes: 0.02 10*3/uL (ref 0.00–0.07)
Basophils Absolute: 0 10*3/uL (ref 0.0–0.1)
Basophils Relative: 0 %
Eosinophils Absolute: 0 10*3/uL (ref 0.0–0.5)
Eosinophils Relative: 0 %
HCT: 27.8 % — ABNORMAL LOW (ref 36.0–46.0)
Hemoglobin: 8.6 g/dL — ABNORMAL LOW (ref 12.0–15.0)
Immature Granulocytes: 0 %
Lymphocytes Relative: 15 %
Lymphs Abs: 1.2 10*3/uL (ref 0.7–4.0)
MCH: 25.8 pg — ABNORMAL LOW (ref 26.0–34.0)
MCHC: 30.9 g/dL (ref 30.0–36.0)
MCV: 83.5 fL (ref 80.0–100.0)
Monocytes Absolute: 0.8 10*3/uL (ref 0.1–1.0)
Monocytes Relative: 10 %
Neutro Abs: 5.9 10*3/uL (ref 1.7–7.7)
Neutrophils Relative %: 75 %
Platelets: 208 10*3/uL (ref 150–400)
RBC: 3.33 MIL/uL — ABNORMAL LOW (ref 3.87–5.11)
RDW: 17.2 % — ABNORMAL HIGH (ref 11.5–15.5)
WBC: 7.9 10*3/uL (ref 4.0–10.5)
nRBC: 0 % (ref 0.0–0.2)

## 2020-07-09 LAB — MAGNESIUM: Magnesium: 1.9 mg/dL (ref 1.7–2.4)

## 2020-07-09 LAB — BASIC METABOLIC PANEL
Anion gap: 9 (ref 5–15)
BUN: 19 mg/dL (ref 8–23)
CO2: 26 mmol/L (ref 22–32)
Calcium: 8.6 mg/dL — ABNORMAL LOW (ref 8.9–10.3)
Chloride: 104 mmol/L (ref 98–111)
Creatinine, Ser: 1.56 mg/dL — ABNORMAL HIGH (ref 0.44–1.00)
GFR, Estimated: 30 mL/min — ABNORMAL LOW (ref >60)
Glucose, Bld: 136 mg/dL — ABNORMAL HIGH (ref 70–99)
Potassium: 3.9 mmol/L (ref 3.5–5.1)
Sodium: 139 mmol/L (ref 135–145)

## 2020-07-09 MED ORDER — PROMETHAZINE HCL 25 MG/ML IJ SOLN
12.5000 mg | Freq: Four times a day (QID) | INTRAMUSCULAR | Status: AC | PRN
  Administered 2020-07-09: 12.5 mg via INTRAVENOUS
  Filled 2020-07-09: qty 1

## 2020-07-09 NOTE — TOC Initial Note (Signed)
Transition of Care [facility]) - Initial/Assessment Note    Patient Details  Name: [name] [name] MRN: [id number] Date of Birth: 04-27-1935  [facility] CM/SW Contact:    Golda Acre, RN Phone Number: 07/09/2020, 1:24 PM  Clinical Narrative:                  85 yo patient with dementia, HTN, DVT, III CKD. In April 2021, the patient was admitted for GI bleed, EGD showed gastric polyp. The patient has had home based palliative care, according to her daughter [name] who is at the bedside and is the primary historian.   Patient has been admitted to step down unit at [facility] in [facility] for acute heart failure, acute pulmonary edema and HTN urgency.  DNR DNI Time limited trial of current interventions ( cardiac medications, diuretics) for the next 24-48 hours or so. If the patient rallies/has some stabilization, then would recommend home with hospice. If the patient has ongoing decline, no chance of meaningful recovery, then would recommend residential hospice, after the end of the time trial.  Expected Discharge Plan: Home w Hospice Care Barriers to Discharge: Continued Medical Work up   Patient Goals and CMS Choice Patient states their goals for this hospitalization and ongoing recovery are:: daughter is stating to go home      Expected Discharge Plan and Services Expected Discharge Plan: Home w Hospice Care       Living arrangements for the past 2 months: Single Family Home                                      Prior Living Arrangements/Services Living arrangements for the past 2 months: Single Family Home Lives with:: Self Patient language and need for interpreter reviewed:: Yes Do you feel safe going back to the place where you live?: Yes      Need for Family Participation in Patient Care: Yes (Comment) Care giver support system in place?: Yes (comment)   Criminal Activity/Legal Involvement Pertinent to Current  Situation/Hospitalization: No - Comment as needed  Activities of Daily Living Home Assistive Devices/Equipment: Dan Humphreys (specify type), Eyeglasses, Hospital bed ADL Screening (condition at time of admission) Patient's cognitive ability adequate to safely complete daily activities?: No Is the patient deaf or have difficulty hearing?: No Does the patient have difficulty seeing, even when wearing glasses/contacts?: Yes Does the patient have difficulty concentrating, remembering, or making decisions?: Yes Patient able to express need for assistance with ADLs?: No Does the patient have difficulty dressing or bathing?: Yes Independently performs ADLs?: No Communication: Dependent Is this a change from baseline?: Pre-admission baseline Dressing (OT): Needs assistance Is this a change from baseline?: Pre-admission baseline Grooming: Needs assistance Is this a change from baseline?: Pre-admission baseline Feeding: Needs assistance Is this a change from baseline?: Pre-admission baseline Bathing: Needs assistance Is this a change from baseline?: Pre-admission baseline Toileting: Needs assistance Is this a change from baseline?: Pre-admission baseline In/Out Bed: Needs assistance Is this a change from baseline?: Pre-admission baseline Walks in Home: Dependent Is this a change from baseline?: Pre-admission baseline Does the patient have difficulty walking or climbing stairs?: Yes Weakness of Legs: Both Weakness of Arms/Hands: Both  Permission Sought/Granted                  Emotional Assessment Appearance:: Appears stated age Attitude/Demeanor/Rapport: Unable to Assess Affect (typically observed): Unable  to Assess Orientation: : Fluctuating Orientation (Suspected and/or reported Sundowners) Alcohol / Substance Use: Not Applicable Psych Involvement: No (comment)  Admission diagnosis:  Acute pulmonary edema (HCC) [J81.0] Hypertensive emergency [I16.1] Patient Active Problem List    Diagnosis Date Noted  . Acute pulmonary edema (HCC) 07/08/2020  . Hypertensive emergency   . Upper GI bleed 01/09/2020  . CKD (chronic kidney disease) stage 3, GFR 30-59 ml/min (HCC) 01/09/2020  . DVT (deep venous thrombosis) (HCC) 01/28/2019  . Abdominal pain 01/28/2019  . AKI (acute kidney injury) (HCC) 01/28/2019  . Chronic anemia 01/28/2019  . B12 deficiency 01/28/2019  . DVT, lower extremity (HCC) 01/28/2019  . Memory loss 07/02/2018  . Insomnia 07/02/2018  . Palliative care encounter 07/02/2018  . Hypertensive urgency   . Chest pain 11/23/2017  . Elevated troponin 11/23/2017  . DVT, femoral, chronic (HCC) 11/23/2017  . Acute on chronic renal insufficiency 11/23/2017  . ARF (acute renal failure) (HCC) 09/13/2017  . Dementia (HCC) 09/13/2017  . Essential hypertension 09/13/2017  . Arthritis 09/13/2017  . Chronic back pain 09/13/2017  . Confusion 06/05/2012   PCP:  Andi Devon, MD Pharmacy:   [facility] [place] [employer] [place], [place] - [address] [place] [address] Phone: [phone] Fax: [phone]  [id number] [place], [place] - [address] [place] [address] Phone: [phone] Fax: [phone]     Social Determinants of Health (SDOH) Interventions    Readmission Risk Interventions Readmission Risk Prevention Plan 01/13/2020  Post Dischage Appt Complete  Medication Screening Complete  Transportation Screening Complete  Some recent data might be hidden

## 2020-07-09 NOTE — Progress Notes (Signed)
PROGRESS NOTE    [name]  ZWC:[id number] DOB: Feb 20, 1935 DOA: 07/07/2020 PCP: Andi Devon, MD    No chief complaint on file.   Brief Narrative:  Chief Complaint: Shortness of breath.  HPI: [name] [name] is a 85 y.o. 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  Did not have any chest pain no fever chills or productive cough.  ED Course: In the ER patient was found to be hypertensive with systolic blood pressure more than 200 and CT angiogram of the chest shows pulmonary edema.  Labs are significant for BNP of 763 creatinine 1.3 hemoglobin 10.2 Covid test negative.  EKG shows normal sinus rhythm.  Patient was given Lasix 20 mg IV and also was placed on nitroglycerin infusion for blood pressure control.  Patient was slightly agitated in the ER and had to be given Haldol.  Subjective:   She vomited last night, She is not waking up this am Daughter at bedside , she would like to talk to palliative care, she does not want see her mother suffer  Assessment & Plan:   Principal Problem:   Acute pulmonary edema (HCC) Active Problems:   Dementia (HCC)   Hypertensive urgency   Chronic anemia   CKD (chronic kidney disease) stage 3, GFR 30-59 ml/min (HCC)   Acute on chronic combined heart failure with acute pulmonary edema and hypertension urgency on presentation -She is started on nitro drip, she does not take oral meds consistently, added on IV Lopressor in the hope of able to wean off nitro drip -Continue IV Lasix   Vomiting: order KUB  History of recurrent DVT, on apixaban  CKD 3B/anemia of chronic disease Creatinine and hemoglobin appear close to baseline Renal dosing meds  History of GI bleed early 2021, currently no sign of  bleeding, monitor  Progressive dementia, at baseline only oriented to self, recognizes family intermittently  Goals of care discussion: Daughter does not want mother to suffer, she would like to talk to palliative care, patent care consulted  DVT prophylaxis: apixaban (ELIQUIS) tablet 2.5 mg Start: 07/08/20 1000 apixaban (ELIQUIS) tablet 2.5 mg   Code Status: DNR Family Communication: Daughter at bedside Disposition:   Status is: Inpatient  Dispo: The patient is from: Home              Anticipated d/c is to: To be determined              Anticipated d/c date is: To be determined              Patient currently  not medically stable to discharge  Consultants:   Palliative care  Procedures:   None  Antimicrobials:   None     Objective: Vitals:   07/09/20 0430 07/09/20 0440 07/09/20 0445 07/09/20 0830  BP: (!) 144/83  (!) 167/85   Pulse: 83  75   Resp: 19  18   Temp:    99.1 F (37.3 C)  TempSrc:    Axillary  SpO2: 98%  98%   Weight:  66.8 kg    Height:        Intake/Output Summary (Last 24 hours) at 07/09/2020 0929 Last data filed at 07/09/2020 0400 Gross per 24 hour  Intake 518.48 ml  Output 1050 ml  Net -531.52 ml   Filed Weights   07/07/20 1826 07/09/20 0440  Weight: 68 kg 66.8 kg    Examination:  General exam: Not waking up Respiratory system: Diminished , no wheezing , no rales , poor respiratory effort , no respiratory distress . Cardiovascular system: S1 & S2 heard, RRR.  Gastrointestinal system: Abdomen is nondistended, soft and nontender. No organomegaly or masses felt. Normal bowel sounds heard. Central nervous system: Alert and oriented. No focal neurological deficits. Extremities: Symmetric 5 x 5 power. Skin: No rashes, lesions or ulcers Psychiatry: Judgement and insight appear normal. Mood & affect appropriate.     Data Reviewed: I have personally reviewed following labs and imaging studies  CBC: Recent Labs  Lab 07/07/20 1931  07/08/20 0607 07/09/20 0302  WBC 6.2 9.4 7.9  NEUTROABS 4.0 7.1 5.9  HGB 10.2* 9.8* 8.6*  HCT 34.1* 32.5* 27.8*  MCV 86.8 85.8 83.5  PLT 226 230 208    Basic Metabolic Panel: Recent Labs  Lab 07/07/20 1931 07/08/20 0607 07/09/20 0302  NA 144 139 139  K 4.3 4.4 3.9  CL 109 106 104  CO2 24 24 26   GLUCOSE 133* 139* 136*  BUN 16 18 19   CREATININE 1.30* 1.28* 1.56*  CALCIUM 9.4 8.6* 8.6*  MG  --   --  1.9    GFR: Estimated Creatinine Clearance: 23.3 mL/min (A) (by C-G formula based on SCr of 1.56 mg/dL (H)).  Liver Function Tests: Recent Labs  Lab 07/07/20 1931 07/08/20 0607  AST 17 22  ALT 15 13  ALKPHOS 79 77  BILITOT 0.8 0.6  PROT 8.5* 7.8  ALBUMIN 3.5 3.4*    CBG: No results for input(s): GLUCAP in the last 168 hours.   Recent Results (from the past 240 hour(s))  Resp Panel by RT PCR (RSV, Flu A&B, Covid) - Nasopharyngeal Swab     Status: None   Collection Time: 07/07/20  7:31 PM   Specimen: Nasopharyngeal Swab  Result Value Ref Range Status   SARS Coronavirus 2 by RT PCR NEGATIVE NEGATIVE Final    Comment: (NOTE) SARS-CoV-2 target nucleic acids are NOT DETECTED.  The SARS-CoV-2 RNA is generally detectable in upper respiratoy specimens during the acute phase of infection. The lowest concentration of SARS-CoV-2 viral copies this assay can detect is 131 copies/mL. A negative result does not preclude SARS-Cov-2 infection and should not be used as the sole basis for treatment or other patient management decisions. A negative result may occur with  improper specimen collection/handling, submission of specimen other than nasopharyngeal swab, presence of viral mutation(s) within the areas targeted by this assay, and inadequate number of viral copies (<131 copies/mL). A negative result must be combined with clinical observations, patient history, and epidemiological information. The expected result is Negative.  Fact Sheet for Patients:    07/10/20  Fact Sheet for Healthcare Providers:  07/09/20  This test is no t yet approved or cleared by the https://www.moore.com/ FDA and  has been authorized for detection and/or diagnosis of SARS-CoV-2 by FDA under an Emergency Use Authorization (EUA). This EUA will remain  in effect (meaning this test can be used) for the duration of the COVID-19 declaration under Section 564(b)(1) of the Act, 21 U.S.C. section 360bbb-3(b)(1), unless the authorization is terminated or revoked sooner.     Influenza A by PCR NEGATIVE NEGATIVE Final   Influenza B by PCR NEGATIVE NEGATIVE Final    Comment: (NOTE) The Xpert Xpress SARS-CoV-2/FLU/RSV assay is intended as an  aid in  the diagnosis of influenza from Nasopharyngeal swab specimens and  should not be used as a sole basis for treatment. Nasal washings and  aspirates are unacceptable for Xpert Xpress SARS-CoV-2/FLU/RSV  testing.  Fact Sheet for Patients: https://www.moore.com/  Fact Sheet for Healthcare Providers: https://www.young.biz/  This test is not yet approved or cleared by the [place] FDA and  has been authorized for detection and/or diagnosis of SARS-CoV-2 by  FDA under an Emergency Use Authorization (EUA). This EUA will remain  in effect (meaning this test can be used) for the duration of the  Covid-19 declaration under Section 564(b)(1) of the Act, 21  U.S.C. section 360bbb-3(b)(1), unless the authorization is  terminated or revoked.    Respiratory Syncytial Virus by PCR NEGATIVE NEGATIVE Final    Comment: (NOTE) Fact Sheet for Patients: https://www.moore.com/  Fact Sheet for Healthcare Providers: https://www.young.biz/  This test is not yet approved or cleared by the [place] FDA and  has been authorized for detection and/or diagnosis of SARS-CoV-2 by  FDA under an Emergency  Use Authorization (EUA). This EUA will remain  in effect (meaning this test can be used) for the duration of the  COVID-19 declaration under Section 564(b)(1) of the Act, 21 U.S.C.  section 360bbb-3(b)(1), unless the authorization is terminated or  revoked. Performed at [facility], [address] [place], [place] [address]   MRSA PCR Screening     Status: None   Collection Time: 07/08/20  8:18 AM   Specimen: Nasal Mucosa; Nasopharyngeal  Result Value Ref Range Status   MRSA by PCR NEGATIVE NEGATIVE Final    Comment:        The GeneXpert MRSA Assay (FDA approved for NASAL specimens only), is one component of a comprehensive MRSA colonization surveillance program. It is not intended to diagnose MRSA infection nor to guide or monitor treatment for MRSA infections. Performed at [facility], [address], [place], [place] [address]          Radiology Studies: DG Chest 2 View  Result Date: 07/07/2020 CLINICAL DATA:  Dyspnea EXAM: CHEST - 2 VIEW COMPARISON:  01/28/2019 FINDINGS: Lung volumes are extremely small and pulmonary insufflation has diminished when compared to prior examination. Left basilar atelectasis or infiltrate is present. A right hilar opacity has developed, possibly representing a hilar mass or hilar adenopathy. Small right pleural effusion has developed. No definite pneumothorax. Cardiac size within normal limits. The pulmonary vascularity is normal when accounting for poor pulmonary insufflation. IMPRESSION: Interval development of right hilar opacity suspicious for a hilar mass or hilar adenopathy. This would be better assessed with contrast enhanced CT examination if indicated. Marked pulmonary hypoinflation. Superimposed mild left basilar atelectasis or infiltrate. Tiny right pleural effusion. Electronically Signed   By: Helyn Numbers MD   On: 07/07/2020 19:27   CT Angio Chest PE W/Cm &/Or Wo Cm  Result Date:  07/08/2020 CLINICAL DATA:  PE suspected, high prob sudden onset SOB with audible wheezing, hilar adenopathy EXAM: CT ANGIOGRAPHY CHEST WITH CONTRAST TECHNIQUE: Multidetector CT imaging of the chest was performed using the standard protocol during bolus administration of intravenous contrast. Multiplanar CT image reconstructions and MIPs were obtained to evaluate the vascular anatomy. CONTRAST:  30mL OMNIPAQUE IOHEXOL 350 MG/ML SOLN COMPARISON:  Chest x-ray 07/07/2020, CT abdomen pelvis 11/23/2018, CT abdomen pelvis 01/28/2019. FINDINGS: Cardiovascular: Satisfactory opacification of the pulmonary arteries to the segmental level. No evidence of pulmonary embolism. Left ventricular hypertrophy. Left atrial enlargement. No  significant pericardial effusion. Aortic root calcifications. Mild mitral annular calcifications. Mild coronary artery calcifications. Atherosclerotic plaque of the thoracic aorta. Thoracic aorta is normal in caliber. Mediastinum/Nodes: Prominent mediastinal lymph nodes with suggestion of a 1.1 cm subcarinal lymph node (5:43). No enlarged hilar or axillary lymph nodes. Thyroid gland, trachea, and esophagus demonstrate no significant findings. Lungs/Pleura: Expiratory phase of respiration. No pneumothorax. Diffuse peribronchovascular ground-glass and consolidative opacities. Small to moderate right and trace to small left pleural effusions. No pneumothorax. Upper Abdomen: Retrograde reflux of intravenous contrast within the inferior cava and hepatic veins. No acute abnormality. Musculoskeletal: Diffusely decreased bone density. Multilevel severe degenerative changes of the thoracic spine. Limited evaluation for acute fracture. No definite displaced acute fracture. Review of the MIP images confirms the above findings. IMPRESSION: 1. Pulmonary edema with small to moderate right and trace to small left pleural effusion. Superimposed infection/inflammation not excluded. 2. Left ventricular hypertrophy and  left atrial enlargement. 3. Diffusely decreased bone density with severe degenerative changes of the spine. Electronically Signed   By: Tish FredericksonMorgane  Naveau M.D.   On: 07/08/2020 02:12        Scheduled Meds: . amLODipine  10 mg Oral Daily  . apixaban  2.5 mg Oral BID  . atorvastatin  20 mg Oral q1800  . Chlorhexidine Gluconate Cloth  6 each Topical Daily  . donepezil  10 mg Oral Daily  . furosemide  20 mg Intravenous Q12H  . influenza vaccine adjuvanted  0.5 mL Intramuscular Tomorrow-1000  . mouth rinse  15 mL Mouth Rinse BID  . metoprolol tartrate  2.5 mg Intravenous Q6H  . mirtazapine  7.5 mg Oral QHS   Continuous Infusions: . nitroGLYCERIN 90 mcg/min (07/09/20 0718)     LOS: 1 day   Time spent: 35mins Greater than 50% of this time was spent in counseling, explanation of diagnosis, planning of further management, and coordination of care.  I have personally reviewed and interpreted on  07/09/2020 daily labs, tele strips, imagings as discussed above under date review session and assessment and plans.  I reviewed all nursing notes, pharmacy notes, consultant notes,  vitals, pertinent old records  I have discussed plan of care as described above with RN , patient and family on 07/09/2020  Voice Recognition /Dragon dictation system was used to create this note, attempts have been made to correct errors. Please contact the author with questions and/or clarifications.   Albertine GratesFang Jalal Rauch, MD PhD FACP [facility]  Available via Epic secure chat 7am-7pm for nonurgent issues Please page for urgent issues To page the attending provider between 7A-7P or the covering provider during after hours 7P-7A, please log into the web site [email or web address] and access using universal [place] password for that web site. If you do not have the password, please call the hospital operator.    07/09/2020, 9:29 AM

## 2020-07-09 NOTE — Consult Note (Signed)
Consultation Note Date: 07/09/2020   Patient Name: [name] [name]  DOB: 01/20/1935  MRN: [id number]  Age / Sex: 85 y.o., female  PCP: Andi Devon, MD Referring Physician: Albertine Grates, MD  Reason for Consultation: Establishing goals of care  HPI/Patient Profile: 85 y.o. female  admitted on 07/07/2020    Clinical Assessment and Goals of Care:  85 yo patient with dementia, HTN, DVT, III CKD. In April 2021, the patient was admitted for GI bleed, EGD showed gastric polyp. The patient has had home based palliative care, according to her daughter [name] [name] who is at the bedside and is the primary historian.   Patient has been admitted to step down unit at [facility] in [facility] for acute heart failure, acute pulmonary edema and HTN urgency.   I introduced myself and palliative care as follows: Palliative medicine is specialized medical care for people living with serious illness. It focuses on providing relief from the symptoms and stress of a serious illness. The goal is to improve quality of life for both the patient and the family. Goals of care: Broad aims of medical therapy in relation to the patient's values and preferences. Our aim is to provide medical care aimed at enabling patients to achieve the goals that matter most to them, given the circumstances of their particular medical situation and their constraints.   Brief life review performed, the patient has been living with her daughter [name] [name]. The patient, since her April hospitalization has had a gradual progressive decline, she usually stares off, has been living in her childhood, no longer recognizes her children anymore, is incontinent, uses a walker, eats soft chopped up food and is still able to feed herself.   We discussed about scope of current hospitalization, goals, wishes and values attempted to be explored, talked about  different levels of hospice support, difference between time limited trial and full comfort care was discussed. We talked about irreversible conditions of heart failure and dementia and their decline trajectory.   See below.   NEXT OF KIN  lives with daughter [name] [name] who is the patient's primary caregiver. [phone].  She has 5 kids.   SUMMARY OF RECOMMENDATIONS    DNR DNI Time limited trial of current interventions ( cardiac medications, diuretics) for the next 24-48 hours or so. If the patient rallies/has some stabilization, then would recommend home with hospice. If the patient has ongoing decline, no chance of meaningful recovery, then would recommend residential hospice, after the end of the time trial.  Thank you for the consult.   Code Status/Advance Care Planning:  DNR    Symptom Management:    as above.   Palliative Prophylaxis:   Delirium Protocol   Psycho-social/Spiritual:   Desire for further Chaplaincy support:yes  Additional Recommendations: Education on Hospice  Prognosis:   Unable to determine  Discharge Planning: To Be Determined      Primary Diagnoses: Present on Admission: . Acute pulmonary edema (HCC) . Dementia (HCC) . Hypertensive urgency . Chronic anemia .  CKD (chronic kidney disease) stage 3, GFR 30-59 ml/min (HCC)   I have reviewed the medical record, interviewed the patient and family, and examined the patient. The following aspects are pertinent.  Past Medical History:  Diagnosis Date  . Arthritis   . Dementia (HCC)   . Dvt femoral (deep venous thrombosis) (HCC)   . Hypertension    Social History   Socioeconomic History  . Marital status: Widowed    Spouse name: Not on file  . Number of children: Not on file  . Years of education: Not on file  . Highest education level: Not on file  Occupational History  . Not on file  Tobacco Use  . Smoking status: Former Smoker    Types: Cigarettes    Quit date: 09/25/1976    Years  since quitting: 43.8  . Smokeless tobacco: Never Used  Vaping Use  . Vaping Use: Never used  Substance and Sexual Activity  . Alcohol use: No  . Drug use: No  . Sexual activity: Never  Other Topics Concern  . Not on file  Social History Narrative  . Not on file   Social Determinants of Health   Financial Resource Strain:   . Difficulty of Paying Living Expenses: Not on file  Food Insecurity:   . Worried About Programme researcher, broadcasting/film/video in the Last Year: Not on file  . Ran Out of Food in the Last Year: Not on file  Transportation Needs:   . Lack of Transportation (Medical): Not on file  . Lack of Transportation (Non-Medical): Not on file  Physical Activity:   . Days of Exercise per Week: Not on file  . Minutes of Exercise per Session: Not on file  Stress:   . Feeling of Stress : Not on file  Social Connections:   . Frequency of Communication with Friends and Family: Not on file  . Frequency of Social Gatherings with Friends and Family: Not on file  . Attends Religious Services: Not on file  . Active Member of Clubs or Organizations: Not on file  . Attends Banker Meetings: Not on file  . Marital Status: Not on file   Family History  Problem Relation Age of Onset  . Heart disease Father   . Heart disease Brother    Scheduled Meds: . amLODipine  10 mg Oral Daily  . apixaban  2.5 mg Oral BID  . atorvastatin  20 mg Oral q1800  . Chlorhexidine Gluconate Cloth  6 each Topical Daily  . donepezil  10 mg Oral Daily  . furosemide  20 mg Intravenous Q12H  . influenza vaccine adjuvanted  0.5 mL Intramuscular Tomorrow-1000  . mouth rinse  15 mL Mouth Rinse BID  . metoprolol tartrate  2.5 mg Intravenous Q6H  . mirtazapine  7.5 mg Oral QHS   Continuous Infusions: . nitroGLYCERIN 90 mcg/min (07/09/20 1106)   PRN Meds:.acetaminophen **OR** acetaminophen, prochlorperazine Medications Prior to Admission:  Prior to Admission medications   Medication Sig Start Date End Date  Taking? Authorizing Provider  acetaminophen-codeine (TYLENOL #3) 300-30 MG tablet Take 1-2 tablets by mouth every 6 (six) hours as needed for moderate pain.    Yes [provider]  amLODipine (NORVASC) 10 MG tablet Take 1 tablet (10 mg total) by mouth daily. 11/28/17  Yes Joseph Art, DO  apixaban (ELIQUIS) 5 MG TABS tablet Take 2.5 mg by mouth 2 (two) times daily.   Yes [provider]  atorvastatin (LIPITOR) 20 MG tablet  Take 20 mg by mouth daily at 6 PM.    Yes [provider]  Coenzyme Q10 (COQ10) 100 MG CAPS Take 1 tablet by mouth daily.   Yes [provider]  donepezil (ARICEPT) 10 MG tablet Take 10 mg by mouth daily. 12/30/19  Yes [provider]  feeding supplement, ENSURE ENLIVE, (ENSURE ENLIVE) LIQD Take 237 mLs by mouth 2 (two) times daily between meals. Patient taking differently: Take 237 mLs by mouth 3 (three) times daily between meals.  09/15/17  Yes Noralee Stain, DO  lisinopril (PRINIVIL,ZESTRIL) 5 MG tablet Take 7.5 mg by mouth daily.   Yes [provider]  mirtazapine (REMERON) 7.5 MG tablet Take 7.5 mg by mouth at bedtime. 12/06/19  Yes [provider]   No Known Allergies Review of Systems +denies pain.   Physical Exam Awakens and interacts some Appears weak and with generalized fatigue Diminished breath sounds S 1 S 2  Abdomen not distended Has some edema Has baseline dementia.   Vital Signs: BP (!) 179/79   Pulse 81   Temp 99.1 F (37.3 C) (Axillary)   Resp 16   Ht 5' (1.524 m)   Wt 66.8 kg   SpO2 99%   BMI 28.76 kg/m  Pain Scale: PAINAD   Pain Score: 0-No pain   SpO2: SpO2: 99 % O2 Device:SpO2: 99 % O2 Flow Rate: .O2 Flow Rate (L/min): 2 L/min  IO: Intake/output summary:   Intake/Output Summary (Last 24 hours) at 07/09/2020 1135 Last data filed at 07/09/2020 1106 Gross per 24 hour  Intake 676.59 ml  Output 1050 ml  Net -373.41 ml    LBM: Last BM Date:  (Prior to  admission) Baseline Weight: Weight: 68 kg Most recent weight: Weight: 66.8 kg     Palliative Assessment/Data:   PPS 30%  Time In:  10 Time Out:  11 Time Total:  60  Greater than 50%  of this time was spent counseling and coordinating care related to the above assessment and plan.  Signed by: Rosalin Hawking, MD   Please contact Palliative Medicine Team phone at [phone] for questions and concerns.  For individual provider: See Loretha Stapler

## 2020-07-10 DIAGNOSIS — J810 Acute pulmonary edema: Secondary | ICD-10-CM | POA: Diagnosis not present

## 2020-07-10 DIAGNOSIS — Z7189 Other specified counseling: Secondary | ICD-10-CM | POA: Diagnosis not present

## 2020-07-10 DIAGNOSIS — Z515 Encounter for palliative care: Secondary | ICD-10-CM | POA: Diagnosis not present

## 2020-07-10 DIAGNOSIS — R531 Weakness: Secondary | ICD-10-CM | POA: Diagnosis not present

## 2020-07-10 MED ORDER — METOPROLOL TARTRATE 5 MG/5ML IV SOLN
5.0000 mg | Freq: Four times a day (QID) | INTRAVENOUS | Status: DC
  Administered 2020-07-10 – 2020-07-12 (×8): 5 mg via INTRAVENOUS
  Filled 2020-07-10 (×9): qty 5

## 2020-07-10 NOTE — Progress Notes (Signed)
Daily Progress Note   Patient Name: [name] [name]       Date: 07/10/2020 DOB: Oct 19, 1934  Age: 85 y.o. MRN#: [id number] Attending Physician: Albertine Grates, MD Primary Care Physician: Andi Devon, MD Admit Date: 07/07/2020  Reason for Consultation/Follow-up: Establishing goals of care  Subjective:  much more awake alert, daughter at bedside, patient is more back to her baseline according to her daughter.  She is with baseline dementia, she is able to answer few yes/no questions, patient is able to eat yogurt fed by her daughter.   Length of Stay: 2  Current Medications: Scheduled Meds:  . amLODipine  10 mg Oral Daily  . apixaban  2.5 mg Oral BID  . atorvastatin  20 mg Oral q1800  . Chlorhexidine Gluconate Cloth  6 each Topical Daily  . donepezil  10 mg Oral Daily  . furosemide  20 mg Intravenous Q12H  . influenza vaccine adjuvanted  0.5 mL Intramuscular Tomorrow-1000  . mouth rinse  15 mL Mouth Rinse BID  . metoprolol tartrate  5 mg Intravenous Q6H  . mirtazapine  7.5 mg Oral QHS    Continuous Infusions: . nitroGLYCERIN 70 mcg/min (07/10/20 0454)    PRN Meds: acetaminophen **OR** acetaminophen, prochlorperazine  Physical Exam         Awake alert No distress Baseline dementia Regular work of breathing Abdomen not distended Less edema LE S1 S 2  Vital Signs: BP 134/70   Pulse 72   Temp 98.9 F (37.2 C) (Axillary)   Resp 12   Ht 5' (1.524 m)   Wt 65 kg   SpO2 99%   BMI 27.99 kg/m  SpO2: SpO2: 99 % O2 Device: O2 Device: Nasal Cannula O2 Flow Rate: O2 Flow Rate (L/min): 2 L/min  Intake/output summary:   Intake/Output Summary (Last 24 hours) at 07/10/2020 1037 Last data filed at 07/10/2020 0454 Gross per 24 hour  Intake 318.02 ml  Output 1250 ml  Net  -931.98 ml   LBM: Last BM Date:  (Prior to admission) Baseline Weight: Weight: 68 kg Most recent weight: Weight: 65 kg       Palliative Assessment/Data: PPS 40%     Patient Active Problem List   Diagnosis Date Noted  . Acute pulmonary edema (HCC) 07/08/2020  . Hypertensive emergency   . Upper GI bleed  01/09/2020  . CKD (chronic kidney disease) stage 3, GFR 30-59 ml/min (HCC) 01/09/2020  . DVT (deep venous thrombosis) (HCC) 01/28/2019  . Abdominal pain 01/28/2019  . AKI (acute kidney injury) (HCC) 01/28/2019  . Chronic anemia 01/28/2019  . B12 deficiency 01/28/2019  . DVT, lower extremity (HCC) 01/28/2019  . Memory loss 07/02/2018  . Insomnia 07/02/2018  . Palliative care encounter 07/02/2018  . Hypertensive urgency   . Chest pain 11/23/2017  . Elevated troponin 11/23/2017  . DVT, femoral, chronic (HCC) 11/23/2017  . Acute on chronic renal insufficiency 11/23/2017  . ARF (acute renal failure) (HCC) 09/13/2017  . Dementia (HCC) 09/13/2017  . Essential hypertension 09/13/2017  . Arthritis 09/13/2017  . Chronic back pain 09/13/2017  . Confusion 06/05/2012    Palliative Care Assessment & Plan   Patient Profile:  85 yo patient with dementia, HTN, DVT, III CKD. In April 2021, the patient was admitted for GI bleed, EGD showed gastric polyp. The patient has had home based palliative care, according to her daughter [name] who is at the bedside and is the primary historian.   Patient has been admitted to step down unit at [facility] in [facility] for acute heart failure, acute pulmonary edema and HTN urgency.   Assessment: Generalized weakness Functional and cognitive decline LE edema   Recommendations/Plan:  Continue current mode of care for now  DNR DNI  Likely more appropriate for home with hospice, towards the end of this hospitalization, discussed with daughter at bedside today, also discussed with TRH MD briefly today.    Code Status:    Code  Status Orders  (From admission, onward)         Start     Ordered   07/08/20 0540  Do not attempt resuscitation (DNR)  Continuous       Question Answer Comment  In the event of cardiac or respiratory ARREST Do not call a "code blue"   In the event of cardiac or respiratory ARREST Do not perform Intubation, CPR, defibrillation or ACLS   In the event of cardiac or respiratory ARREST Use medication by any route, position, wound care, and other measures to relive pain and suffering. May use oxygen, suction and manual treatment of airway obstruction as needed for comfort.      07/08/20 0540        Code Status History    Date Active Date Inactive Code Status Order ID Comments User Context   01/09/2020 1321 01/13/2020 1554 Full Code [id number]  Colon Branch, MD ED   01/28/2019 0356 01/29/2019 1609 Full Code [id number]  John Giovanni, MD ED   11/23/2017 1641 11/27/2017 2128 Full Code [id number]  Meredeth Ide, MD Inpatient   09/13/2017 0643 09/15/2017 1528 Full Code [id number]  Marcos Eke, PA-C Inpatient   Advance Care Planning Activity       Prognosis:   Unable to determine Guarded  Discharge Planning:  To Be Determined Possibly home with hospice.  Care plan was discussed with  Patient, daughter.   Thank you for allowing the Palliative Medicine Team to assist in the care of this patient.   Time In:  10 Time Out: 10.25 Total Time 25 Prolonged Time Billed  no       Greater than 50%  of this time was spent counseling and coordinating care related to the above assessment and plan.  Rosalin Hawking, MD  Please contact Palliative Medicine Team phone at [phone] for questions and concerns.

## 2020-07-10 NOTE — Progress Notes (Addendum)
PROGRESS NOTE    [name] [name]  TFT:[id number] DOB: [birth year] DOA: 07/07/2020 PCP: Andi Devon, MD    No chief complaint on file.   Brief Narrative:  Chief Complaint: Shortness of breath.  HPI: [name] [name] is a 85 y.o. 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  Did not have any chest pain no fever chills or productive cough.  ED Course: In the ER patient was found to be hypertensive with systolic blood pressure more than 200 and CT angiogram of the chest shows pulmonary edema.  Labs are significant for BNP of 763 creatinine 1.3 hemoglobin 10.2 Covid test negative.  EKG shows normal sinus rhythm.  Patient was given Lasix 20 mg IV and also was placed on nitroglycerin infusion for blood pressure control.  Patient was slightly agitated in the ER and had to be given Haldol.  Subjective:  Per RN patient had a uneventful night This morning she is awake and conversant, she denies pain, does not appear in acute distress, she is pleasantly confused She remains on nitrodrip, bp improved 1.25liter urine output documented last 24hrs, bilateral lower extremity edema is improving  Assessment & Plan:   Principal Problem:   Acute pulmonary edema (HCC) Active Problems:   Dementia (HCC)   Hypertensive urgency   Chronic anemia   CKD (chronic kidney disease) stage 3, GFR 30-59 ml/min (HCC)   Acute on chronic combined heart failure with acute pulmonary edema and hypertension urgency on presentation -She is started on nitro drip, she does not take oral meds consistently, increase IV Lopressor in the hope of able to wean off nitro drip -Continue IV Lasix -bp improving, good urine output   Vomiting:  KUB unremarkable, appear resolved, possible from gi  congestion in the setting of heart failure  History of recurrent DVT, on apixaban  CKD 3B/anemia of chronic disease Creatinine and hemoglobin appear close to baseline Renal dosing meds  History of GI bleed early 2021, currently no sign of bleeding, monitor  Progressive dementia, at baseline only oriented to self, recognizes family intermittently  Goals of care discussion: Daughter does not want mother to suffer, she would like to talk to palliative care, patent care consulted  DVT prophylaxis: apixaban (ELIQUIS) tablet 2.5 mg Start: 07/08/20 1000 apixaban (ELIQUIS) tablet 2.5 mg   Code Status: DNR Family Communication: Daughter not at bedside, will call daughter later, addendum: daughter updated over the phone in the afternoon Disposition:   Status is: Inpatient  Dispo: The patient is from: Home              Anticipated d/c is to: To be determined              Anticipated d/c date is: To be determined              Patient currently  not medically stable to discharge  Consultants:   Palliative care  Procedures:   None  Antimicrobials:   None     Objective: Vitals:   07/10/20 0400 07/10/20 0456 07/10/20 0500 07/10/20 0645  BP: (!) 110/54  (!) 120/58 134/70  Pulse: 66  (!) 59 72  Resp: 19  18 12   Temp:      TempSrc:      SpO2: 99%  100% 99%  Weight:  65 kg    Height:        Intake/Output Summary (Last 24 hours) at 07/10/2020 0806 Last data filed at 07/10/2020 0454 Gross per 24 hour  Intake 484.26 ml  Output 1250 ml  Net -765.74 ml   Filed Weights   07/07/20 1826 07/09/20 0440 07/10/20 0456  Weight: 68 kg 66.8 kg 65 kg    Examination:  General exam: awake, interactive, pleasantly confused Respiratory system: Diminished , no wheezing , no rales , poor respiratory effort , no respiratory distress . Cardiovascular system: S1 & S2 heard, RRR.  Gastrointestinal system: Abdomen is nondistended, soft and nontender.  Normal bowel sounds heard. Central  nervous system: Alert and oriented to self, confused Extremities: generalized weakness, bilateral lower extremity edema is improving Skin: No rashes, lesions or ulcers Psychiatry: pleasantly confused    Data Reviewed: I have personally reviewed following labs and imaging studies  CBC: Recent Labs  Lab 07/07/20 1931 07/08/20 0607 07/09/20 0302  WBC 6.2 9.4 7.9  NEUTROABS 4.0 7.1 5.9  HGB 10.2* 9.8* 8.6*  HCT 34.1* 32.5* 27.8*  MCV 86.8 85.8 83.5  PLT 226 230 208    Basic Metabolic Panel: Recent Labs  Lab 07/07/20 1931 07/08/20 0607 07/09/20 0302  NA 144 139 139  K 4.3 4.4 3.9  CL 109 106 104  CO2 24 24 26   GLUCOSE 133* 139* 136*  BUN 16 18 19   CREATININE 1.30* 1.28* 1.56*  CALCIUM 9.4 8.6* 8.6*  MG  --   --  1.9    GFR: Estimated Creatinine Clearance: 23 mL/min (A) (by C-G formula based on SCr of 1.56 mg/dL (H)).  Liver Function Tests: Recent Labs  Lab 07/07/20 1931 07/08/20 0607  AST 17 22  ALT 15 13  ALKPHOS 79 77  BILITOT 0.8 0.6  PROT 8.5* 7.8  ALBUMIN 3.5 3.4*    CBG: No results for input(s): GLUCAP in the last 168 hours.   Recent Results (from the past 240 hour(s))  Resp Panel by RT PCR (RSV, Flu A&B, Covid) - Nasopharyngeal Swab     Status: None   Collection Time: 07/07/20  7:31 PM   Specimen: Nasopharyngeal Swab  Result Value Ref Range Status   SARS Coronavirus 2 by RT PCR NEGATIVE NEGATIVE Final    Comment: (NOTE) SARS-CoV-2 target nucleic acids are NOT DETECTED.  The SARS-CoV-2 RNA is generally detectable in upper respiratoy specimens during the acute phase of infection. The lowest concentration of SARS-CoV-2 viral copies this assay can detect is 131 copies/mL. A negative result does not preclude SARS-Cov-2 infection and should not be used as the sole basis for treatment or other patient management decisions. A negative result may occur with  improper specimen collection/handling, submission of specimen other than nasopharyngeal  swab, presence of viral mutation(s) within the areas targeted by this assay, and inadequate number of viral copies (<131 copies/mL). A negative result must be combined with clinical observations, patient history, and epidemiological information. The expected result is Negative.  Fact Sheet for Patients:  https://www.moore.com/https://www.fda.gov/media/142436/download  Fact Sheet for Healthcare Providers:  https://www.young.biz/https://www.fda.gov/media/142435/download  This test is no t yet approved or cleared by the [place] FDA and  has been authorized for detection and/or diagnosis of SARS-CoV-2 by FDA under an Emergency Use Authorization (EUA). This EUA will remain  in effect (meaning this test can be used) for the duration of the COVID-19 declaration under Section 564(b)(1) of the Act, 21 U.S.C. section 360bbb-3(b)(1), unless the authorization is terminated or  revoked sooner.     Influenza A by PCR NEGATIVE NEGATIVE Final   Influenza B by PCR NEGATIVE NEGATIVE Final    Comment: (NOTE) The Xpert Xpress SARS-CoV-2/FLU/RSV assay is intended as an aid in  the diagnosis of influenza from Nasopharyngeal swab specimens and  should not be used as a sole basis for treatment. Nasal washings and  aspirates are unacceptable for Xpert Xpress SARS-CoV-2/FLU/RSV  testing.  Fact Sheet for Patients: https://www.moore.com/  Fact Sheet for Healthcare Providers: https://www.young.biz/  This test is not yet approved or cleared by the [place] FDA and  has been authorized for detection and/or diagnosis of SARS-CoV-2 by  FDA under an Emergency Use Authorization (EUA). This EUA will remain  in effect (meaning this test can be used) for the duration of the  Covid-19 declaration under Section 564(b)(1) of the Act, 21  U.S.C. section 360bbb-3(b)(1), unless the authorization is  terminated or revoked.    Respiratory Syncytial Virus by PCR NEGATIVE NEGATIVE Final    Comment: (NOTE) Fact  Sheet for Patients: https://www.moore.com/  Fact Sheet for Healthcare Providers: https://www.young.biz/  This test is not yet approved or cleared by the [place] FDA and  has been authorized for detection and/or diagnosis of SARS-CoV-2 by  FDA under an Emergency Use Authorization (EUA). This EUA will remain  in effect (meaning this test can be used) for the duration of the  COVID-19 declaration under Section 564(b)(1) of the Act, 21 U.S.C.  section 360bbb-3(b)(1), unless the authorization is terminated or  revoked. Performed at [facility], [address] [place], [place] [address]   MRSA PCR Screening     Status: None   Collection Time: 07/08/20  8:18 AM   Specimen: Nasal Mucosa; Nasopharyngeal  Result Value Ref Range Status   MRSA by PCR NEGATIVE NEGATIVE Final    Comment:        The GeneXpert MRSA Assay (FDA approved for NASAL specimens only), is one component of a comprehensive MRSA colonization surveillance program. It is not intended to diagnose MRSA infection nor to guide or monitor treatment for MRSA infections. Performed at [facility], [address], [place], [place] [address]          Radiology Studies: DG Abd 1 View  Result Date: 07/09/2020 CLINICAL DATA:  Nausea and vomiting EXAM: ABDOMEN - 1 VIEW COMPARISON:  None. FINDINGS: Unremarkable bowel gas pattern. No significant stool burden. Decreased osseous mineralization. Degenerative changes of the lumbar spine. IMPRESSION: Unremarkable bowel gas pattern. Electronically Signed   By: Guadlupe Spanish M.D.   On: 07/09/2020 09:35        Scheduled Meds: . amLODipine  10 mg Oral Daily  . apixaban  2.5 mg Oral BID  . atorvastatin  20 mg Oral q1800  . Chlorhexidine Gluconate Cloth  6 each Topical Daily  . donepezil  10 mg Oral Daily  . furosemide  20 mg Intravenous Q12H  . influenza vaccine adjuvanted  0.5 mL Intramuscular  Tomorrow-1000  . mouth rinse  15 mL Mouth Rinse BID  . metoprolol tartrate  2.5 mg Intravenous Q6H  . mirtazapine  7.5 mg Oral QHS   Continuous Infusions: . nitroGLYCERIN 70 mcg/min (07/10/20 0454)     LOS: 2 days   Time spent: Greater than 50% of this time was spent in counseling, explanation of diagnosis, planning of further management, and coordination of care.  I have personally reviewed and interpreted on  07/10/2020 daily labs, tele strips,I reviewed all  nursing notes, pharmacy notes, consultant notes,  vitals, pertinent old records  I have discussed plan of care as described above with RN , patient and family on 07/10/2020  Voice Recognition /Dragon dictation system was used to create this note, attempts have been made to correct errors. Please contact the author with questions and/or clarifications.   Albertine Grates, MD PhD FACP [facility]  Available via Epic secure chat 7am-7pm for nonurgent issues Please page for urgent issues To page the attending provider between 7A-7P or the covering provider during after hours 7P-7A, please log into the web site [email or web address] and access using universal [place] password for that web site. If you do not have the password, please call the hospital operator.    07/10/2020, 8:06 AM

## 2020-07-11 DIAGNOSIS — Z7189 Other specified counseling: Secondary | ICD-10-CM | POA: Diagnosis not present

## 2020-07-11 DIAGNOSIS — Z515 Encounter for palliative care: Secondary | ICD-10-CM | POA: Diagnosis not present

## 2020-07-11 DIAGNOSIS — J810 Acute pulmonary edema: Secondary | ICD-10-CM | POA: Diagnosis not present

## 2020-07-11 DIAGNOSIS — R531 Weakness: Secondary | ICD-10-CM

## 2020-07-11 LAB — BASIC METABOLIC PANEL
Anion gap: 12 (ref 5–15)
BUN: 23 mg/dL (ref 8–23)
CO2: 28 mmol/L (ref 22–32)
Calcium: 8.7 mg/dL — ABNORMAL LOW (ref 8.9–10.3)
Chloride: 100 mmol/L (ref 98–111)
Creatinine, Ser: 1.38 mg/dL — ABNORMAL HIGH (ref 0.44–1.00)
GFR, Estimated: 35 mL/min — ABNORMAL LOW (ref >60)
Glucose, Bld: 104 mg/dL — ABNORMAL HIGH (ref 70–99)
Potassium: 3.4 mmol/L — ABNORMAL LOW (ref 3.5–5.1)
Sodium: 140 mmol/L (ref 135–145)

## 2020-07-11 LAB — CBC
HCT: 30 % — ABNORMAL LOW (ref 36.0–46.0)
Hemoglobin: 9.6 g/dL — ABNORMAL LOW (ref 12.0–15.0)
MCH: 26.4 pg (ref 26.0–34.0)
MCHC: 32 g/dL (ref 30.0–36.0)
MCV: 82.6 fL (ref 80.0–100.0)
Platelets: 196 10*3/uL (ref 150–400)
RBC: 3.63 MIL/uL — ABNORMAL LOW (ref 3.87–5.11)
RDW: 16.9 % — ABNORMAL HIGH (ref 11.5–15.5)
WBC: 7.5 10*3/uL (ref 4.0–10.5)
nRBC: 0 % (ref 0.0–0.2)

## 2020-07-11 LAB — MAGNESIUM: Magnesium: 1.8 mg/dL (ref 1.7–2.4)

## 2020-07-11 MED ORDER — MAGNESIUM SULFATE 2 GM/50ML IV SOLN
2.0000 g | Freq: Once | INTRAVENOUS | Status: AC
  Administered 2020-07-11: 2 g via INTRAVENOUS
  Filled 2020-07-11: qty 50

## 2020-07-11 MED ORDER — LABETALOL HCL 5 MG/ML IV SOLN: 5.0000 mg | INTRAVENOUS | Status: DC | PRN

## 2020-07-11 MED ORDER — POTASSIUM CHLORIDE 20 MEQ PO PACK
40.0000 meq | PACK | Freq: Once | ORAL | Status: AC
  Administered 2020-07-11: 40 meq via ORAL
  Filled 2020-07-11: qty 2

## 2020-07-11 MED ORDER — ISOSORBIDE MONONITRATE ER 60 MG PO TB24
30.0000 mg | ORAL_TABLET | Freq: Every day | ORAL | Status: DC
  Administered 2020-07-11 – 2020-07-13 (×3): 30 mg via ORAL
  Filled 2020-07-11 (×3): qty 1

## 2020-07-11 NOTE — Progress Notes (Addendum)
PROGRESS NOTE    [name] [name]  JSE:[id number] DOB: 25-Jan-1935 DOA: 07/07/2020 PCP: Andi Devon, MD    No chief complaint on file.   Brief Narrative:  Chief Complaint: Shortness of breath.  HPI: [name] [name] is a 85 y.o. 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  Did not have any chest pain no fever chills or productive cough.  ED Course: In the ER patient was found to be hypertensive with systolic blood pressure more than 200 and CT angiogram of the chest shows pulmonary edema.  Labs are significant for BNP of 763 creatinine 1.3 hemoglobin 10.2 Covid test negative.  EKG shows normal sinus rhythm.  Patient was given Lasix 20 mg IV and also was placed on nitroglycerin infusion for blood pressure control.  Patient was slightly agitated in the ER and had to be given Haldol.  Subjective:  Per RN patient had a uneventful night, this morning she is drowsy, not interactive She is weaned off nitrodrip, bp improved, however she does not take oral meds unless daughter is present, very poor oral intake 1.9liter urine output documented last 24hrs, bilateral lower extremity edema is resolving, now started to wrinkle   Assessment & Plan:   Principal Problem:   Acute pulmonary edema (HCC) Active Problems:   Dementia (HCC)   Hypertensive urgency   Goals of care, counseling/discussion   Chronic anemia   CKD (chronic kidney disease) stage 3, GFR 30-59 ml/min (HCC)   General weakness   Acute on chronic combined heart failure with acute pulmonary edema and hypertension urgency on presentation -She is started on nitro drip on admission, now weaned off nitrodrip -she does not take oral meds consistently, continue IV Lopressor , plan to change to coreg if  she starts to take oral meds reliably , plan to start imdur -Continue IV Lasix for another 24hrs, she is approching euvolemic, bp improving, good urine output, can likely transition to oral lasix tomorrow -transfer out of icu to med surg bed  Hypokalemia/hypomagnesemia: Replace k/mag   Vomiting:  KUB unremarkable, appear resolved, possible from gi congestion in the setting of heart failure  History of recurrent DVT, on apixaban  CKD 3B/anemia of chronic disease Creatinine and hemoglobin appear close to baseline Renal dosing meds  History of GI bleed early 2021, currently no sign of bleeding, monitor  Progressive dementia, at baseline only oriented to self, recognizes family intermittently  Goals of care discussion: palliative care input appreciated, home with home hospice vs residential hospice  DVT prophylaxis: apixaban (ELIQUIS) tablet 2.5 mg Start: 07/08/20 1000 apixaban (ELIQUIS) tablet 2.5 mg   Code Status: DNR Family Communication: Daughter updated on 10/15, 10/16 and 10/17 Disposition:   Status is: Inpatient  Dispo: The patient is from: Home              Anticipated d/c is to: home hospice vs reisidential hospice              Anticipated d/c date is: 24-48hrs              pending mental status improvement /if patient is able to take oral meds/diet   Consultants:   Palliative care  Procedures:   None  Antimicrobials:   None     Objective: Vitals:  07/11/20 0325 07/11/20 0400 07/11/20 0500 07/11/20 0600  BP: (!) 157/67 (!) 156/70 (!) 146/77 (!) 142/54  Pulse: 67 63 62 62  Resp: (!) 27 14 (!) 21 (!) 21  Temp:  99.2 F (37.3 C)    TempSrc:  Oral    SpO2: 97% 95% 97% 94%  Weight:   62.8 kg   Height:        Intake/Output Summary (Last 24 hours) at 07/11/2020 0803 Last data filed at 07/11/2020 0539 Gross per 24 hour  Intake 135.42 ml  Output 1900 ml  Net -1764.58 ml   Filed Weights   07/09/20 0440 07/10/20 0456 07/11/20 0500  Weight: 66.8 kg  65 kg 62.8 kg    Examination:  General exam: drowsy, not interactive Respiratory system: Diminished , no wheezing , no rales , poor respiratory effort , no respiratory distress . Cardiovascular system: S1 & S2 heard, RRR.  Gastrointestinal system: Abdomen is nondistended, soft and nontender.  Normal bowel sounds heard. Central nervous system: drowsy, not interactive Extremities: generalized weakness, bilateral lower extremity edema is improving/resolving, started to wrinkle  Skin: No rashes, lesions or ulcers Psychiatry: drowsy    Data Reviewed: I have personally reviewed following labs and imaging studies  CBC: Recent Labs  Lab 07/07/20 1931 07/08/20 0607 07/09/20 0302 07/11/20 0310  WBC 6.2 9.4 7.9 7.5  NEUTROABS 4.0 7.1 5.9  --   HGB 10.2* 9.8* 8.6* 9.6*  HCT 34.1* 32.5* 27.8* 30.0*  MCV 86.8 85.8 83.5 82.6  PLT 226 230 208 196    Basic Metabolic Panel: Recent Labs  Lab 07/07/20 1931 07/08/20 0607 07/09/20 0302 07/11/20 0310  NA 144 139 139 140  K 4.3 4.4 3.9 3.4*  CL 109 106 104 100  CO2 24 24 26 28   GLUCOSE 133* 139* 136* 104*  BUN 16 18 19 23   CREATININE 1.30* 1.28* 1.56* 1.38*  CALCIUM 9.4 8.6* 8.6* 8.7*  MG  --   --  1.9 1.8    GFR: Estimated Creatinine Clearance: 25.6 mL/min (A) (by C-G formula based on SCr of 1.38 mg/dL (H)).  Liver Function Tests: Recent Labs  Lab 07/07/20 1931 07/08/20 0607  AST 17 22  ALT 15 13  ALKPHOS 79 77  BILITOT 0.8 0.6  PROT 8.5* 7.8  ALBUMIN 3.5 3.4*    CBG: No results for input(s): GLUCAP in the last 168 hours.   Recent Results (from the past 240 hour(s))  Resp Panel by RT PCR (RSV, Flu A&B, Covid) - Nasopharyngeal Swab     Status: None   Collection Time: 07/07/20  7:31 PM   Specimen: Nasopharyngeal Swab  Result Value Ref Range Status   SARS Coronavirus 2 by RT PCR NEGATIVE NEGATIVE Final    Comment: (NOTE) SARS-CoV-2 target nucleic acids are NOT DETECTED.  The SARS-CoV-2 RNA is generally detectable  in upper respiratoy specimens during the acute phase of infection. The lowest concentration of SARS-CoV-2 viral copies this assay can detect is 131 copies/mL. A negative result does not preclude SARS-Cov-2 infection and should not be used as the sole basis for treatment or other patient management decisions. A negative result may occur with  improper specimen collection/handling, submission of specimen other than nasopharyngeal swab, presence of viral mutation(s) within the areas targeted by this assay, and inadequate number of viral copies (<131 copies/mL). A negative result must be combined with clinical observations, patient history, and epidemiological information. The expected result is Negative.  Fact Sheet for Patients:  https://www.moore.com/https://www.fda.gov/media/142436/download  Fact Sheet  for Healthcare Providers:  https://www.young.biz/  This test is no t yet approved or cleared by the [place] and  has been authorized for detection and/or diagnosis of SARS-CoV-2 by FDA under an Emergency Use Authorization (EUA). This EUA will remain  in effect (meaning this test can be used) for the duration of the COVID-19 declaration under Section 564(b)(1) of the Act, 21 U.S.C. section 360bbb-3(b)(1), unless the authorization is terminated or revoked sooner.     Influenza A by PCR NEGATIVE NEGATIVE Final   Influenza B by PCR NEGATIVE NEGATIVE Final    Comment: (NOTE) The Xpert Xpress SARS-CoV-2/FLU/RSV assay is intended as an aid in  the diagnosis of influenza from Nasopharyngeal swab specimens and  should not be used as a sole basis for treatment. Nasal washings and  aspirates are unacceptable for Xpert Xpress SARS-CoV-2/FLU/RSV  testing.  Fact Sheet for Patients: https://www.moore.com/  Fact Sheet for Healthcare Providers: https://www.young.biz/  This test is not yet approved or cleared by the [place] FDA and  has been  authorized for detection and/or diagnosis of SARS-CoV-2 by  FDA under an Emergency Use Authorization (EUA). This EUA will remain  in effect (meaning this test can be used) for the duration of the  Covid-19 declaration under Section 564(b)(1) of the Act, 21  U.S.C. section 360bbb-3(b)(1), unless the authorization is  terminated or revoked.    Respiratory Syncytial Virus by PCR NEGATIVE NEGATIVE Final    Comment: (NOTE) Fact Sheet for Patients: https://www.moore.com/  Fact Sheet for Healthcare Providers: https://www.young.biz/  This test is not yet approved or cleared by the [place] FDA and  has been authorized for detection and/or diagnosis of SARS-CoV-2 by  FDA under an Emergency Use Authorization (EUA). This EUA will remain  in effect (meaning this test can be used) for the duration of the  COVID-19 declaration under Section 564(b)(1) of the Act, 21 U.S.C.  section 360bbb-3(b)(1), unless the authorization is terminated or  revoked. Performed at [facility], [address], [place], [place] [address]   MRSA PCR Screening     Status: None   Collection Time: 07/08/20  8:18 AM   Specimen: Nasal Mucosa; Nasopharyngeal  Result Value Ref Range Status   MRSA by PCR NEGATIVE NEGATIVE Final    Comment:        The GeneXpert MRSA Assay (FDA approved for NASAL specimens only), is one component of a comprehensive MRSA colonization surveillance program. It is not intended to diagnose MRSA infection nor to guide or monitor treatment for MRSA infections. Performed at [facility], [address] [place], [place] [address]          Radiology Studies: DG Abd 1 View  Result Date: 07/09/2020 CLINICAL DATA:  Nausea and vomiting EXAM: ABDOMEN - 1 VIEW COMPARISON:  None. FINDINGS: Unremarkable bowel gas pattern. No significant stool burden. Decreased osseous mineralization. Degenerative changes of the  lumbar spine. IMPRESSION: Unremarkable bowel gas pattern. Electronically Signed   By: Guadlupe Spanish M.D.   On: 07/09/2020 09:35        Scheduled Meds: . amLODipine  10 mg Oral Daily  . apixaban  2.5 mg Oral BID  . atorvastatin  20 mg Oral q1800  . Chlorhexidine Gluconate Cloth  6 each Topical Daily  . donepezil  10 mg Oral Daily  . furosemide  20 mg Intravenous Q12H  . influenza vaccine adjuvanted  0.5 mL Intramuscular Tomorrow-1000  . mouth rinse  15 mL Mouth Rinse BID  .  metoprolol tartrate  5 mg Intravenous Q6H  . mirtazapine  7.5 mg Oral QHS   Continuous Infusions: . nitroGLYCERIN Stopped (07/10/20 1327)     LOS: 3 days   Time spent: Greater than 50% of this time was spent in counseling, explanation of diagnosis, planning of further management, and coordination of care.  I have personally reviewed and interpreted on  07/11/2020 daily labs, tele strips,I reviewed all nursing notes, pharmacy notes, consultant notes,  vitals, pertinent old records  I have discussed plan of care as described above with RN , patient and family on 07/11/2020  Voice Recognition /Dragon dictation system was used to create this note, attempts have been made to correct errors. Please contact the author with questions and/or clarifications.   Albertine Grates, MD PhD FACP [facility]  Available via Epic secure chat 7am-7pm for nonurgent issues Please page for urgent issues To page the attending provider between 7A-7P or the covering provider during after hours 7P-7A, please log into the web site [email or web address] and access using universal [place] password for that web site. If you do not have the password, please call the hospital operator.    07/11/2020, 8:03 AM

## 2020-07-12 DIAGNOSIS — R531 Weakness: Secondary | ICD-10-CM | POA: Diagnosis not present

## 2020-07-12 DIAGNOSIS — Z7189 Other specified counseling: Secondary | ICD-10-CM | POA: Diagnosis not present

## 2020-07-12 DIAGNOSIS — J810 Acute pulmonary edema: Secondary | ICD-10-CM | POA: Diagnosis not present

## 2020-07-12 LAB — BASIC METABOLIC PANEL
Anion gap: 10 (ref 5–15)
BUN: 23 mg/dL (ref 8–23)
CO2: 27 mmol/L (ref 22–32)
Calcium: 8.4 mg/dL — ABNORMAL LOW (ref 8.9–10.3)
Chloride: 99 mmol/L (ref 98–111)
Creatinine, Ser: 1.45 mg/dL — ABNORMAL HIGH (ref 0.44–1.00)
GFR, Estimated: 33 mL/min — ABNORMAL LOW (ref >60)
Glucose, Bld: 134 mg/dL — ABNORMAL HIGH (ref 70–99)
Potassium: 4 mmol/L (ref 3.5–5.1)
Sodium: 136 mmol/L (ref 135–145)

## 2020-07-12 LAB — GLUCOSE, CAPILLARY
Glucose-Capillary: 98 mg/dL (ref 70–99)
Glucose-Capillary: 158 mg/dL — ABNORMAL HIGH (ref 70–99)

## 2020-07-12 MED ORDER — FUROSEMIDE 40 MG PO TABS
40.0000 mg | ORAL_TABLET | Freq: Every day | ORAL | Status: DC
  Administered 2020-07-12 – 2020-07-13 (×2): 40 mg via ORAL
  Filled 2020-07-12 (×2): qty 1

## 2020-07-12 MED ORDER — METOPROLOL TARTRATE 12.5 MG HALF TABLET
12.5000 mg | ORAL_TABLET | Freq: Two times a day (BID) | ORAL | Status: DC
  Administered 2020-07-12 – 2020-07-13 (×2): 12.5 mg via ORAL
  Filled 2020-07-12 (×2): qty 1

## 2020-07-12 NOTE — Progress Notes (Signed)
Daily Progress Note   Patient Name: [name] [name]       Date: 07/12/2020 DOB: Jun 20, 1935  Age: 85 y.o. MRN#: [id number] Attending Physician: Burnadette Pop, MD Primary Care Physician: Andi Devon, MD Admit Date: 07/07/2020  Reason for Consultation/Follow-up: Establishing goals of care  Subjective:  much more awake alert, other daughter at bedside, patient is more back to her baseline   She is with baseline dementia, she is able to answer few yes/no questions, patient is able to eat yogurt fed by her daughter.   Length of Stay: 4  Current Medications: Scheduled Meds:   amLODipine  10 mg Oral Daily   apixaban  2.5 mg Oral BID   atorvastatin  20 mg Oral q1800   Chlorhexidine Gluconate Cloth  6 each Topical Daily   donepezil  10 mg Oral Daily   furosemide  40 mg Oral Daily   influenza vaccine adjuvanted  0.5 mL Intramuscular Tomorrow-1000   isosorbide mononitrate  30 mg Oral Daily   mouth rinse  15 mL Mouth Rinse BID   metoprolol tartrate  5 mg Intravenous Q6H   mirtazapine  7.5 mg Oral QHS    Continuous Infusions:   PRN Meds: acetaminophen **OR** acetaminophen, labetalol, prochlorperazine  Physical Exam         Awake alert No distress Baseline dementia Regular work of breathing Abdomen not distended Less edema LE S1 S 2  Vital Signs: BP (!) 171/63    Pulse 81    Temp 98.4 F (36.9 C) (Axillary)    Resp 15    Ht 5' (1.524 m)    Wt 62 kg    SpO2 97%    BMI 26.69 kg/m  SpO2: SpO2: 97 % O2 Device: O2 Device: Room Air O2 Flow Rate: O2 Flow Rate (L/min): 2 L/min  Intake/output summary:   Intake/Output Summary (Last 24 hours) at 07/12/2020 1157 Last data filed at 07/11/2020 2200 Gross per 24 hour  Intake 120 ml  Output 850 ml  Net -730 ml   LBM:  Last BM Date:  (pta) Baseline Weight: Weight: 68 kg Most recent weight: Weight: 62 kg       Palliative Assessment/Data: PPS 40%     Patient Active Problem List   Diagnosis Date Noted   General weakness    Acute pulmonary edema (  HCC) 07/08/2020   Hypertensive emergency    Upper GI bleed 01/09/2020   CKD (chronic kidney disease) stage 3, GFR 30-59 ml/min (HCC) 01/09/2020   DVT (deep venous thrombosis) (HCC) 01/28/2019   Abdominal pain 01/28/2019   AKI (acute kidney injury) (HCC) 01/28/2019   Chronic anemia 01/28/2019   B12 deficiency 01/28/2019   DVT, lower extremity (HCC) 01/28/2019   Memory loss 07/02/2018   Insomnia 07/02/2018   Goals of care, counseling/discussion 07/02/2018   Hypertensive urgency    Chest pain 11/23/2017   Elevated troponin 11/23/2017   DVT, femoral, chronic (HCC) 11/23/2017   Acute on chronic renal insufficiency 11/23/2017   ARF (acute renal failure) (HCC) 09/13/2017   Dementia (HCC) 09/13/2017   Essential hypertension 09/13/2017   Arthritis 09/13/2017   Chronic back pain 09/13/2017   Confusion 06/05/2012    Palliative Care Assessment & Plan   Patient Profile:  85 yo patient with dementia, HTN, DVT, III CKD. In April 2021, the patient was admitted for GI bleed, EGD showed gastric polyp. The patient has had home based palliative care, according to her daughter [name] [name] who is at the bedside and is the primary historian.   Patient has been admitted to step down unit at [facility] in [facility] for acute heart failure, acute pulmonary edema and HTN urgency.   Assessment: Generalized weakness Functional and cognitive decline LE edema   Recommendations/Plan:  Continue current mode of care for now  DNR DNI Recommend home with hospice  Code Status:    Code Status Orders  (From admission, onward)         Start     Ordered   07/08/20 0540  Do not attempt resuscitation (DNR)  Continuous       Question  Answer Comment  In the event of cardiac or respiratory ARREST Do not call a code blue   In the event of cardiac or respiratory ARREST Do not perform Intubation, CPR, defibrillation or ACLS   In the event of cardiac or respiratory ARREST Use medication by any route, position, wound care, and other measures to relive pain and suffering. May use oxygen, suction and manual treatment of airway obstruction as needed for comfort.      07/08/20 0540        Code Status History    Date Active Date Inactive Code Status Order ID Comments User Context   01/09/2020 1321 01/13/2020 1554 Full Code [id number]  Colon Branch, MD ED   01/28/2019 0356 01/29/2019 1609 Full Code [id number]  John Giovanni, MD ED   11/23/2017 1641 11/27/2017 2128 Full Code [id number]  Meredeth Ide, MD Inpatient   09/13/2017 0643 09/15/2017 1528 Full Code [id number]  Marcos Eke, PA-C Inpatient   Advance Care Planning Activity       Prognosis:   ? Less than 6 months.  Guarded  Discharge Planning:     home with hospice.  Care plan was discussed with  Patient, daughter.   Thank you for allowing the Palliative Medicine Team to assist in the care of this patient.   Time In:  10 Time Out: 10.25 Total Time 25 Prolonged Time Billed  no       Greater than 50%  of this time was spent counseling and coordinating care related to the above assessment and plan.  Rosalin Hawking, MD  Please contact Palliative Medicine Team phone at [phone] for questions and concerns.

## 2020-07-12 NOTE — TOC Progression Note (Signed)
Transition of Care [facility]) - Progression Note    Patient Details  Name: [name] [name] MRN: [id number] Date of Birth: 1935-02-09  Transition of [facility]) CM/SW Contact  Golda Acre, RN Phone Number: 07/12/2020, 8:04 AM  Clinical Narrative:    She is weaned off nitrodrip, bp improved, however she does not take oral meds unless daughter is present, very poor oral intake 1.9liter urine output documented last 24hrs, bilateral lower extremity edema is resolving, now started to wrinkle   Assessment & Plan:   Principal Problem:   Acute pulmonary edema (HCC) Active Problems:   Dementia (HCC)   Hypertensive urgency   Goals of care, counseling/discussion   Chronic anemia   CKD (chronic kidney disease) stage 3, GFR 30-59 ml/min (HCC)   General weakness   Acute on chronic combined heart failure with acute pulmonary edema and hypertension urgency on presentation -She is started on nitro drip on admission, now weaned off nitrodrip -she does not take oral meds consistently, continue IV Lopressor , plan to change to coreg if she starts to take oral meds reliably , plan to start imdur -Continue IV Lasix for another 24hrs, she is approching euvolemic, bp improving, good urine output, can likely transition to oral lasix tomorrow -transfer out of icu to med surg bed Temp-100.8, room air, no other changes, labs wnl, Following for progression  Following for plan of care-home  Expected Discharge Plan: Home w Hospice Care Barriers to Discharge: Continued Medical Work up  Expected Discharge Plan and Services Expected Discharge Plan: Home w Hospice Care       Living arrangements for the past 2 months: Single Family Home                                       Social Determinants of Health (SDOH) Interventions    Readmission Risk Interventions Readmission Risk Prevention Plan 01/13/2020  Post Dischage Appt Complete  Medication Screening Complete  Transportation Screening  Complete  Some recent data might be hidden

## 2020-07-12 NOTE — Progress Notes (Signed)
PROGRESS NOTE    [name] [name]  NWG:[id number] DOB: April 05, 1935 DOA: 07/07/2020 PCP: Andi Devon, MD   Chief Complain: Shortness of breath  Brief Narrative:  Patient is 85 year old female with history of dementia, DVT on Eliquis, hypertension, CKD stage III, systolic heart failure was brought to the emergency room with complaints of shortness of breath.  On presentation, she was found to be severely hypertensive with blood pressure more than 200.  Chest imaging showed pulmonary edema.  Had elevated BNP of 763.  Patient was admitted for the management of pulmonary edema secondary to severe hypertension and was started on nitroglycerin drip.  Due to her multiple comorbidities and advanced age and poor prognosis, palliative  care was consulted.  Current plan is to discharge her home with home hospice tomorrow..  Assessment & Plan:   Principal Problem:   Acute pulmonary edema (HCC) Active Problems:   Dementia (HCC)   Hypertensive urgency   Goals of care, counseling/discussion   Chronic anemia   CKD (chronic kidney disease) stage 3, GFR 30-59 ml/min (HCC)   General weakness   Hypertensive emergency: Presented with pulmonary edema, severe hypertension.  Started on nitroglycerin drip on presentation.  Currently blood pressure more stable.  Continue current regimen.    Pulmonary edema/acute on chronic combined heart failure: Presented with elevated BNP .she was treated with IV Lasix, changed to p.o.  Continue to monitor weight, input/output.  Routine euvolemic status.  Blood pressure has improved.  Her echocardiogram on 11/2017 showed ejection fraction of 30 to 35%, diffuse hypokinesis, grade 1 diastolic dysfunction.  History of recurrent DVT: On Eliquis for anticoagulation  Vomiting: Resolved.  KUB did not show any significant pathology.  Electrolyte abnormalities: Being monitored and supplemented.  CKD stage IIIb: Currently kidney function at baseline. continue to monitor  BMP  Normocytic anemia: Associated with chronic kidney disease.  Currently hemoglobin stable.  Dementia: Oriented to self at baseline.  She presented with increased confusion.  Recognizes family members.  Delirium precautions.  Frequent reorienting.  Continue supportive care  Goals of care: Elderly patient with multiple comorbidities.  Palliative care has been involved.  Goal is to discharge her home with home health/hospice          DVT prophylaxis:Eliquis Code Status: DnR Family Communication: daughter at bedside Status is: Inpatient  Remains inpatient appropriate because:Unsafe d/c plan   Dispo: The patient is from: Home              Anticipated d/c is to: Home              Anticipated d/c date is: 1 day              Patient currently is not medically stable to d/c.  Needs to arrange home hospice before discharge.  Still in ICU, needs to be monitored 24 hours more on the floor.   Consultants: None  Procedures:None  Antimicrobials:  Anti-infectives (From admission, onward)   None      Subjective: Patient seen and examined at the bedside this morning.  Daughter was at the bedside.  Blood pressure stable.  She was not in any kind of distress, looks comfortable.  All questions of the daughter were answered and she is in agreement with discharge to home with hospice tomorrow.  Objective: Vitals:   07/11/20 2000 07/11/20 2200 07/12/20 0000 07/12/20 0418  BP: 128/61 136/65 (!) 128/46   Pulse:      Resp: (!) 22 (!) 22 (!) 25   Temp:  100.2 F (37.9 C)  (!) 100.8 F (38.2 C)   TempSrc: Axillary  Axillary   SpO2: 96%     Weight:    62 kg  Height:        Intake/Output Summary (Last 24 hours) at 07/12/2020 0815 Last data filed at 07/11/2020 2200 Gross per 24 hour  Intake 120 ml  Output 850 ml  Net -730 ml   Filed Weights   07/10/20 0456 07/11/20 0500 07/12/20 0418  Weight: 65 kg 62.8 kg 62 kg    Examination:  General exam: Deconditioned, debilitated,  chronically ill looking  HEENT:PERRL,Oral mucosa moist, Ear/Nose normal on gross exam Respiratory system: Bilateral equal air entry, normal vesicular breath sounds, no wheezes or crackles  Cardiovascular system: S1 & S2 heard, RRR. No JVD, murmurs, rubs, gallops or clicks. No pedal edema. Gastrointestinal system: Abdomen is nondistended, soft and nontender. No organomegaly or masses felt. Normal bowel sounds heard. Central nervous system: Alert and awake but not oriented.  Extremities: Trace bilateral lower extremity  edema, no clubbing ,no cyanosis Skin: No rashes, lesions or ulcers,no icterus ,no pallor    Data Reviewed: I have personally reviewed following labs and imaging studies  CBC: Recent Labs  Lab 07/07/20 1931 07/08/20 0607 07/09/20 0302 07/11/20 0310  WBC 6.2 9.4 7.9 7.5  NEUTROABS 4.0 7.1 5.9  --   HGB 10.2* 9.8* 8.6* 9.6*  HCT 34.1* 32.5* 27.8* 30.0*  MCV 86.8 85.8 83.5 82.6  PLT 226 230 208 196   Basic Metabolic Panel: Recent Labs  Lab 07/07/20 1931 07/08/20 0607 07/09/20 0302 07/11/20 0310 07/12/20 0318  NA 144 139 139 140 136  K 4.3 4.4 3.9 3.4* 4.0  CL 109 106 104 100 99  CO2 24 24 26 28 27   GLUCOSE 133* 139* 136* 104* 134*  BUN 16 18 19 23 23   CREATININE 1.30* 1.28* 1.56* 1.38* 1.45*  CALCIUM 9.4 8.6* 8.6* 8.7* 8.4*  MG  --   --  1.9 1.8  --    GFR: Estimated Creatinine Clearance: 24.2 mL/min (A) (by C-G formula based on SCr of 1.45 mg/dL (H)). Liver Function Tests: Recent Labs  Lab 07/07/20 1931 07/08/20 0607  AST 17 22  ALT 15 13  ALKPHOS 79 77  BILITOT 0.8 0.6  PROT 8.5* 7.8  ALBUMIN 3.5 3.4*   No results for input(s): LIPASE, AMYLASE in the last 168 hours. No results for input(s): AMMONIA in the last 168 hours. Coagulation Profile: No results for input(s): INR, PROTIME in the last 168 hours. Cardiac Enzymes: No results for input(s): CKTOTAL, CKMB, CKMBINDEX, TROPONINI in the last 168 hours. BNP (last 3 results) No results for  input(s): PROBNP in the last 8760 hours. HbA1C: No results for input(s): HGBA1C in the last 72 hours. CBG: No results for input(s): GLUCAP in the last 168 hours. Lipid Profile: No results for input(s): CHOL, HDL, LDLCALC, TRIG, CHOLHDL, LDLDIRECT in the last 72 hours. Thyroid Function Tests: No results for input(s): TSH, T4TOTAL, FREET4, T3FREE, THYROIDAB in the last 72 hours. Anemia Panel: No results for input(s): VITAMINB12, FOLATE, FERRITIN, TIBC, IRON, RETICCTPCT in the last 72 hours. Sepsis Labs: No results for input(s): PROCALCITON, LATICACIDVEN in the last 168 hours.  Recent Results (from the past 240 hour(s))  Resp Panel by RT PCR (RSV, Flu A&B, Covid) - Nasopharyngeal Swab     Status: None   Collection Time: 07/07/20  7:31 PM   Specimen: Nasopharyngeal Swab  Result Value Ref Range Status   SARS Coronavirus  2 by RT PCR NEGATIVE NEGATIVE Final    Comment: (NOTE) SARS-CoV-2 target nucleic acids are NOT DETECTED.  The SARS-CoV-2 RNA is generally detectable in upper respiratoy specimens during the acute phase of infection. The lowest concentration of SARS-CoV-2 viral copies this assay can detect is 131 copies/mL. A negative result does not preclude SARS-Cov-2 infection and should not be used as the sole basis for treatment or other patient management decisions. A negative result may occur with  improper specimen collection/handling, submission of specimen other than nasopharyngeal swab, presence of viral mutation(s) within the areas targeted by this assay, and inadequate number of viral copies (<131 copies/mL). A negative result must be combined with clinical observations, patient history, and epidemiological information. The expected result is Negative.  Fact Sheet for Patients:  https://www.moore.com/https://www.fda.gov/media/142436/download  Fact Sheet for Healthcare Providers:  https://www.young.biz/https://www.fda.gov/media/142435/download  This test is no t yet approved or cleared by the [place] FDA  and  has been authorized for detection and/or diagnosis of SARS-CoV-2 by FDA under an Emergency Use Authorization (EUA). This EUA will remain  in effect (meaning this test can be used) for the duration of the COVID-19 declaration under Section 564(b)(1) of the Act, 21 U.S.C. section 360bbb-3(b)(1), unless the authorization is terminated or revoked sooner.     Influenza A by PCR NEGATIVE NEGATIVE Final   Influenza B by PCR NEGATIVE NEGATIVE Final    Comment: (NOTE) The Xpert Xpress SARS-CoV-2/FLU/RSV assay is intended as an aid in  the diagnosis of influenza from Nasopharyngeal swab specimens and  should not be used as a sole basis for treatment. Nasal washings and  aspirates are unacceptable for Xpert Xpress SARS-CoV-2/FLU/RSV  testing.  Fact Sheet for Patients: https://www.moore.com/https://www.fda.gov/media/142436/download  Fact Sheet for Healthcare Providers: https://www.young.biz/https://www.fda.gov/media/142435/download  This test is not yet approved or cleared by the [place] FDA and  has been authorized for detection and/or diagnosis of SARS-CoV-2 by  FDA under an Emergency Use Authorization (EUA). This EUA will remain  in effect (meaning this test can be used) for the duration of the  Covid-19 declaration under Section 564(b)(1) of the Act, 21  U.S.C. section 360bbb-3(b)(1), unless the authorization is  terminated or revoked.    Respiratory Syncytial Virus by PCR NEGATIVE NEGATIVE Final    Comment: (NOTE) Fact Sheet for Patients: https://www.moore.com/https://www.fda.gov/media/142436/download  Fact Sheet for Healthcare Providers: https://www.young.biz/https://www.fda.gov/media/142435/download  This test is not yet approved or cleared by the [place] FDA and  has been authorized for detection and/or diagnosis of SARS-CoV-2 by  FDA under an Emergency Use Authorization (EUA). This EUA will remain  in effect (meaning this test can be used) for the duration of the  COVID-19 declaration under Section 564(b)(1) of the Act, 21 U.S.C.  section  360bbb-3(b)(1), unless the authorization is terminated or  revoked. Performed at [facility], [address] [place], [place] [address]   MRSA PCR Screening     Status: None   Collection Time: 07/08/20  8:18 AM   Specimen: Nasal Mucosa; Nasopharyngeal  Result Value Ref Range Status   MRSA by PCR NEGATIVE NEGATIVE Final    Comment:        The GeneXpert MRSA Assay (FDA approved for NASAL specimens only), is one component of a comprehensive MRSA colonization surveillance program. It is not intended to diagnose MRSA infection nor to guide or monitor treatment for MRSA infections. Performed at [facility], [address] [place], [place] [address]          Radiology  Studies: No results found.      Scheduled Meds: . amLODipine  10 mg Oral Daily  . apixaban  2.5 mg Oral BID  . atorvastatin  20 mg Oral q1800  . Chlorhexidine Gluconate Cloth  6 each Topical Daily  . donepezil  10 mg Oral Daily  . furosemide  20 mg Intravenous Q12H  . influenza vaccine adjuvanted  0.5 mL Intramuscular Tomorrow-1000  . isosorbide mononitrate  30 mg Oral Daily  . mouth rinse  15 mL Mouth Rinse BID  . metoprolol tartrate  5 mg Intravenous Q6H  . mirtazapine  7.5 mg Oral QHS   Continuous Infusions:   LOS: 4 days    Time spent: 25 mins.More than 50% of that time was spent in counseling and/or coordination of care.      Burnadette Pop, MD [facility] P10/18/2021, 8:15 AM

## 2020-07-13 DIAGNOSIS — J810 Acute pulmonary edema: Secondary | ICD-10-CM | POA: Diagnosis not present

## 2020-07-13 LAB — BASIC METABOLIC PANEL
Anion gap: 11 (ref 5–15)
BUN: 30 mg/dL — ABNORMAL HIGH (ref 8–23)
CO2: 28 mmol/L (ref 22–32)
Calcium: 8.6 mg/dL — ABNORMAL LOW (ref 8.9–10.3)
Chloride: 101 mmol/L (ref 98–111)
Creatinine, Ser: 1.43 mg/dL — ABNORMAL HIGH (ref 0.44–1.00)
GFR, Estimated: 34 mL/min — ABNORMAL LOW (ref >60)
Glucose, Bld: 114 mg/dL — ABNORMAL HIGH (ref 70–99)
Potassium: 3.8 mmol/L (ref 3.5–5.1)
Sodium: 140 mmol/L (ref 135–145)

## 2020-07-13 LAB — GLUCOSE, CAPILLARY: Glucose-Capillary: 116 mg/dL — ABNORMAL HIGH (ref 70–99)

## 2020-07-13 LAB — MAGNESIUM: Magnesium: 2.3 mg/dL (ref 1.7–2.4)

## 2020-07-13 MED ORDER — FUROSEMIDE 40 MG PO TABS: 40.0000 mg | ORAL_TABLET | Freq: Every day | ORAL | 1 refills | Status: AC

## 2020-07-13 MED ORDER — ISOSORBIDE MONONITRATE ER 30 MG PO TB24: 30.0000 mg | ORAL_TABLET | Freq: Every day | ORAL | 1 refills | Status: AC

## 2020-07-13 MED ORDER — METOPROLOL TARTRATE 25 MG PO TABS: 12.5000 mg | ORAL_TABLET | Freq: Two times a day (BID) | ORAL | 1 refills | Status: AC

## 2020-07-13 NOTE — Discharge Summary (Signed)
Physician Discharge Summary  [name] GammonSusie W [name] UJW:[id number] DOB: 27-Dec-1934 DOA: 07/07/2020  PCP: [name] DevonShelton, Kimberly, MD  Admit date: 07/07/2020 Discharge date: 07/13/2020  Admitted From: Home Disposition:  Home  Discharge Condition:Stable CODE STATUS: DNR Diet recommendation: Heart Healthy  Brief/Interim Summary:  Patient is 85 year old female with history of dementia, DVT on Eliquis, hypertension, CKD stage III, systolic heart failure was brought to the emergency room with complaints of shortness of breath.  On presentation, she was found to be severely hypertensive with blood pressure more than 200.  Chest imaging showed pulmonary edema.  Had elevated BNP of 763.  Patient was admitted for the management of pulmonary edema secondary to severe hypertension and was started on nitroglycerin drip.  Currently her respiratory  status has stabilized.  Blood pressure has improved. Due to her multiple comorbidities and advanced age and poor prognosis, palliative  care was consulted.  Current plan is to discharge her home with home hospice .  Following problems were addressed during her hospitalization:  Hypertensive emergency: Presented with pulmonary edema, severe hypertension.  Started on nitroglycerin drip on presentation.  Currently blood pressure  stable.  Continue current regimen.    Pulmonary edema/acute on chronic combined heart failure: Presented with elevated BNP .she was treated with IV Lasix, changed to p.o.  Continue to monitor weight, input/output.  Routine euvolemic status.  Blood pressure has improved.  Her echocardiogram on 11/2017 showed ejection fraction of 30 to 35%, diffuse hypokinesis, grade 1 diastolic dysfunction.  History of recurrent DVT: On Eliquis for anticoagulation  Vomiting: Resolved.  KUB did not show any significant pathology.  Electrolyte abnormalities: Was monitored and supplemented.  CKD stage IIIb: Currently kidney function at baseline.Check  BMP in a  week  Normocytic anemia: Associated with chronic kidney disease.  Currently hemoglobin stable.  Dementia: Oriented to self at baseline.  She presented with increased confusion.  Recognizes family members.    Continue supportive care  Goals of care: Elderly patient with multiple comorbidities.  Palliative care had been involved. Plan is to discharge her home with home hospice  Discharge Diagnoses:  Principal Problem:   Acute pulmonary edema (HCC) Active Problems:   Dementia (HCC)   Hypertensive urgency   Goals of care, counseling/discussion   Chronic anemia   CKD (chronic kidney disease) stage 3, GFR 30-59 ml/min (HCC)   General weakness    Discharge Instructions  Discharge Instructions    Diet - low sodium heart healthy   Complete by: As directed    Discharge instructions   Complete by: As directed    1) Take prescribed medications as instructed. 2)Follow up with hospice at home. 3)Do a CBC and BMP tests in a week.   Increase activity slowly   Complete by: As directed      Allergies as of 07/13/2020   No Known Allergies     Medication List    STOP taking these medications   lisinopril 5 MG tablet Commonly known as: ZESTRIL     TAKE these medications   acetaminophen-codeine 300-30 MG tablet Commonly known as: TYLENOL #3 Take 1-2 tablets by mouth every 6 (six) hours as needed for moderate pain.   amLODipine 10 MG tablet Commonly known as: NORVASC Take 1 tablet (10 mg total) by mouth daily.   atorvastatin 20 MG tablet Commonly known as: LIPITOR Take 20 mg by mouth daily at 6 PM.   CoQ10 100 MG Caps Take 1 tablet by mouth daily.   donepezil 10 MG tablet Commonly known as:  ARICEPT Take 10 mg by mouth daily.   Eliquis 5 MG Tabs tablet Generic drug: apixaban Take 2.5 mg by mouth 2 (two) times daily.   feeding supplement Liqd Take 237 mLs by mouth 2 (two) times daily between meals. What changed: when to take this   furosemide 40 MG tablet Commonly  known as: LASIX Take 1 tablet (40 mg total) by mouth daily. Start taking on: July 14, 2020   isosorbide mononitrate 30 MG 24 hr tablet Commonly known as: IMDUR Take 1 tablet (30 mg total) by mouth daily. Start taking on: July 14, 2020   metoprolol tartrate 25 MG tablet Commonly known as: LOPRESSOR Take 0.5 tablets (12.5 mg total) by mouth 2 (two) times daily.   mirtazapine 7.5 MG tablet Commonly known as: REMERON Take 7.5 mg by mouth at bedtime.       Follow-up Information    [name] Devon, MD. Schedule an appointment as soon as possible for a visit in 1 week(s).   Specialty: Internal Medicine Contact information: [address] [place] [address] [phone]              No Known Allergies  Consultations:  Palliative care   Procedures/Studies: DG Chest 2 View  Result Date: 07/07/2020 CLINICAL DATA:  Dyspnea EXAM: CHEST - 2 VIEW COMPARISON:  01/28/2019 FINDINGS: Lung volumes are extremely small and pulmonary insufflation has diminished when compared to prior examination. Left basilar atelectasis or infiltrate is present. A right hilar opacity has developed, possibly representing a hilar mass or hilar adenopathy. Small right pleural effusion has developed. No definite pneumothorax. Cardiac size within normal limits. The pulmonary vascularity is normal when accounting for poor pulmonary insufflation. IMPRESSION: Interval development of right hilar opacity suspicious for a hilar mass or hilar adenopathy. This would be better assessed with contrast enhanced CT examination if indicated. Marked pulmonary hypoinflation. Superimposed mild left basilar atelectasis or infiltrate. Tiny right pleural effusion. Electronically Signed   By: Helyn Numbers MD   On: 07/07/2020 19:27   DG Abd 1 View  Result Date: 07/09/2020 CLINICAL DATA:  Nausea and vomiting EXAM: ABDOMEN - 1 VIEW COMPARISON:  None. FINDINGS: Unremarkable bowel gas pattern. No significant  stool burden. Decreased osseous mineralization. Degenerative changes of the lumbar spine. IMPRESSION: Unremarkable bowel gas pattern. Electronically Signed   By: Guadlupe Spanish M.D.   On: 07/09/2020 09:35   CT Angio Chest PE W/Cm &/Or Wo Cm  Result Date: 07/08/2020 CLINICAL DATA:  PE suspected, high prob sudden onset SOB with audible wheezing, hilar adenopathy EXAM: CT ANGIOGRAPHY CHEST WITH CONTRAST TECHNIQUE: Multidetector CT imaging of the chest was performed using the standard protocol during bolus administration of intravenous contrast. Multiplanar CT image reconstructions and MIPs were obtained to evaluate the vascular anatomy. CONTRAST:  48mL OMNIPAQUE IOHEXOL 350 MG/ML SOLN COMPARISON:  Chest x-ray 07/07/2020, CT abdomen pelvis 11/23/2018, CT abdomen pelvis 01/28/2019. FINDINGS: Cardiovascular: Satisfactory opacification of the pulmonary arteries to the segmental level. No evidence of pulmonary embolism. Left ventricular hypertrophy. Left atrial enlargement. No significant pericardial effusion. Aortic root calcifications. Mild mitral annular calcifications. Mild coronary artery calcifications. Atherosclerotic plaque of the thoracic aorta. Thoracic aorta is normal in caliber. Mediastinum/Nodes: Prominent mediastinal lymph nodes with suggestion of a 1.1 cm subcarinal lymph node (5:43). No enlarged hilar or axillary lymph nodes. Thyroid gland, trachea, and esophagus demonstrate no significant findings. Lungs/Pleura: Expiratory phase of respiration. No pneumothorax. Diffuse peribronchovascular ground-glass and consolidative opacities. Small to moderate right and trace to small left pleural  effusions. No pneumothorax. Upper Abdomen: Retrograde reflux of intravenous contrast within the inferior cava and hepatic veins. No acute abnormality. Musculoskeletal: Diffusely decreased bone density. Multilevel severe degenerative changes of the thoracic spine. Limited evaluation for acute fracture. No definite  displaced acute fracture. Review of the MIP images confirms the above findings. IMPRESSION: 1. Pulmonary edema with small to moderate right and trace to small left pleural effusion. Superimposed infection/inflammation not excluded. 2. Left ventricular hypertrophy and left atrial enlargement. 3. Diffusely decreased bone density with severe degenerative changes of the spine. Electronically Signed   By: Tish Frederickson M.D.   On: 07/08/2020 02:12       Subjective: Patient seen and examined at the bedside this morning.  Blood pressure stable.  No significant change from yesterday.  Daughter was at the bedside.  Medically stable for discharge.  Discharge Exam: Vitals:   07/13/20 0800 07/13/20 0900  BP: (!) 133/56   Pulse: 73 78  Resp: 17 14  Temp: 98.7 F (37.1 C)   SpO2: 99% 98%   Vitals:   07/13/20 0600 07/13/20 0700 07/13/20 0800 07/13/20 0900  BP: (!) 156/83  (!) 133/56   Pulse: 73 78 73 78  Resp: 14 (!) 22 17 14   Temp:   98.7 F (37.1 C)   TempSrc:   Axillary   SpO2: 97% 98% 99% 98%  Weight:      Height:        General: Pt is alert, awake, not in acute distress, chronically ill looking Cardiovascular: RRR, S1/S2 +, no rubs, no gallops Respiratory: CTA bilaterally, no wheezing, no rhonchi Abdominal: Soft, NT, ND, bowel sounds + Extremities: no edema, no cyanosis    The results of significant diagnostics from this hospitalization (including imaging, microbiology, ancillary and laboratory) are listed below for reference.     Microbiology: Recent Results (from the past 240 hour(s))  Resp Panel by RT PCR (RSV, Flu A&B, Covid) - Nasopharyngeal Swab     Status: None   Collection Time: 07/07/20  7:31 PM   Specimen: Nasopharyngeal Swab  Result Value Ref Range Status   SARS Coronavirus 2 by RT PCR NEGATIVE NEGATIVE Final    Comment: (NOTE) SARS-CoV-2 target nucleic acids are NOT DETECTED.  The SARS-CoV-2 RNA is generally detectable in upper respiratoy specimens during the  acute phase of infection. The lowest concentration of SARS-CoV-2 viral copies this assay can detect is 131 copies/mL. A negative result does not preclude SARS-Cov-2 infection and should not be used as the sole basis for treatment or other patient management decisions. A negative result may occur with  improper specimen collection/handling, submission of specimen other than nasopharyngeal swab, presence of viral mutation(s) within the areas targeted by this assay, and inadequate number of viral copies (<131 copies/mL). A negative result must be combined with clinical observations, patient history, and epidemiological information. The expected result is Negative.  Fact Sheet for Patients:  07/09/20  Fact Sheet for Healthcare Providers:  https://www.moore.com/  This test is no t yet approved or cleared by the https://www.young.biz/ FDA and  has been authorized for detection and/or diagnosis of SARS-CoV-2 by FDA under an Emergency Use Authorization (EUA). This EUA will remain  in effect (meaning this test can be used) for the duration of the COVID-19 declaration under Section 564(b)(1) of the Act, 21 U.S.C. section 360bbb-3(b)(1), unless the authorization is terminated or revoked sooner.     Influenza A by PCR NEGATIVE NEGATIVE Final   Influenza B by PCR NEGATIVE NEGATIVE Final  Comment: (NOTE) The Xpert Xpress SARS-CoV-2/FLU/RSV assay is intended as an aid in  the diagnosis of influenza from Nasopharyngeal swab specimens and  should not be used as a sole basis for treatment. Nasal washings and  aspirates are unacceptable for Xpert Xpress SARS-CoV-2/FLU/RSV  testing.  Fact Sheet for Patients: https://www.moore.com/  Fact Sheet for Healthcare Providers: https://www.young.biz/  This test is not yet approved or cleared by the [place] FDA and  has been authorized for detection and/or diagnosis  of SARS-CoV-2 by  FDA under an Emergency Use Authorization (EUA). This EUA will remain  in effect (meaning this test can be used) for the duration of the  Covid-19 declaration under Section 564(b)(1) of the Act, 21  U.S.C. section 360bbb-3(b)(1), unless the authorization is  terminated or revoked.    Respiratory Syncytial Virus by PCR NEGATIVE NEGATIVE Final    Comment: (NOTE) Fact Sheet for Patients: https://www.moore.com/  Fact Sheet for Healthcare Providers: https://www.young.biz/  This test is not yet approved or cleared by the [place] FDA and  has been authorized for detection and/or diagnosis of SARS-CoV-2 by  FDA under an Emergency Use Authorization (EUA). This EUA will remain  in effect (meaning this test can be used) for the duration of the  COVID-19 declaration under Section 564(b)(1) of the Act, 21 U.S.C.  section 360bbb-3(b)(1), unless the authorization is terminated or  revoked. Performed at [facility], [address], [place], [place] [address]   MRSA PCR Screening     Status: None   Collection Time: 07/08/20  8:18 AM   Specimen: Nasal Mucosa; Nasopharyngeal  Result Value Ref Range Status   MRSA by PCR NEGATIVE NEGATIVE Final    Comment:        The GeneXpert MRSA Assay (FDA approved for NASAL specimens only), is one component of a comprehensive MRSA colonization surveillance program. It is not intended to diagnose MRSA infection nor to guide or monitor treatment for MRSA infections. Performed at [facility], [address], [place], [place] [address]      Labs: BNP (last 3 results) Recent Labs    07/07/20 1931  BNP 763.3*   Basic Metabolic Panel: Recent Labs  Lab 07/08/20 0607 07/09/20 0302 07/11/20 0310 07/12/20 0318 07/13/20 0302  NA 139 139 140 136 140  K 4.4 3.9 3.4* 4.0 3.8  CL 106 104 100 99 101  CO2 GLUCOSE 139* 136* 104* 134* 114*   BUN 30*  CREATININE 1.28* 1.56* 1.38* 1.45* 1.43*  CALCIUM 8.6* 8.6* 8.7* 8.4* 8.6*  MG  --  1.9 1.8  --  2.3   Liver Function Tests: Recent Labs  Lab 07/07/20 1931 07/08/20 0607  AST 17 22  ALT 15 13  ALKPHOS 79 77  BILITOT 0.8 0.6  PROT 8.5* 7.8  ALBUMIN 3.5 3.4*   No results for input(s): LIPASE, AMYLASE in the last 168 hours. No results for input(s): AMMONIA in the last 168 hours. CBC: Recent Labs  Lab 07/07/20 1931 07/08/20 0607 07/09/20 0302 07/11/20 0310  WBC 6.2 9.4 7.9 7.5  NEUTROABS 4.0 7.1 5.9  --   HGB 10.2* 9.8* 8.6* 9.6*  HCT 34.1* 32.5* 27.8* 30.0*  MCV 86.8 85.8 83.5 82.6  PLT 226 230 208 196   Cardiac Enzymes: No results for input(s): CKTOTAL, CKMB, CKMBINDEX, TROPONINI in the last 168 hours. BNP: Invalid input(s): POCBNP CBG: Recent Labs  Lab 07/12/20 1542 07/12/20 2042 07/13/20 0757  GLUCAP 98 158* 116*   D-Dimer No results for input(s): DDIMER in the last 72 hours. Hgb A1c No results for input(s): HGBA1C in the last 72 hours. Lipid Profile No results for input(s): CHOL, HDL, LDLCALC, TRIG, CHOLHDL, LDLDIRECT in the last 72 hours. Thyroid function studies No results for input(s): TSH, T4TOTAL, T3FREE, THYROIDAB in the last 72 hours.  Invalid input(s): FREET3 Anemia work up No results for input(s): VITAMINB12, FOLATE, FERRITIN, TIBC, IRON, RETICCTPCT in the last 72 hours. Urinalysis    Component Value Date/Time   COLORURINE YELLOW 01/29/2019 0630   APPEARANCEUR HAZY (A) 01/29/2019 0630   LABSPEC 1.011 01/29/2019 0630   PHURINE 6.0 01/29/2019 0630   GLUCOSEU NEGATIVE 01/29/2019 0630   HGBUR NEGATIVE 01/29/2019 0630   BILIRUBINUR NEGATIVE 01/29/2019 0630   KETONESUR NEGATIVE 01/29/2019 0630   PROTEINUR 30 (A) 01/29/2019 0630   UROBILINOGEN 0.2 11/23/2018 1422   NITRITE NEGATIVE 01/29/2019 0630   LEUKOCYTESUR MODERATE (A) 01/29/2019 0630   Sepsis Labs Invalid input(s): PROCALCITONIN,  WBC,   LACTICIDVEN Microbiology Recent Results (from the past 240 hour(s))  Resp Panel by RT PCR (RSV, Flu A&B, Covid) - Nasopharyngeal Swab     Status: None   Collection Time: 07/07/20  7:31 PM   Specimen: Nasopharyngeal Swab  Result Value Ref Range Status   SARS Coronavirus 2 by RT PCR NEGATIVE NEGATIVE Final    Comment: (NOTE) SARS-CoV-2 target nucleic acids are NOT DETECTED.  The SARS-CoV-2 RNA is generally detectable in upper respiratoy specimens during the acute phase of infection. The lowest concentration of SARS-CoV-2 viral copies this assay can detect is 131 copies/mL. A negative result does not preclude SARS-Cov-2 infection and should not be used as the sole basis for treatment or other patient management decisions. A negative result may occur with  improper specimen collection/handling, submission of specimen other than nasopharyngeal swab, presence of viral mutation(s) within the areas targeted by this assay, and inadequate number of viral copies (<131 copies/mL). A negative result must be combined with clinical observations, patient history, and epidemiological information. The expected result is Negative.  Fact Sheet for Patients:  https://www.moore.com/  Fact Sheet for Healthcare Providers:  https://www.young.biz/  This test is no t yet approved or cleared by the [place] FDA and  has been authorized for detection and/or diagnosis of SARS-CoV-2 by FDA under an Emergency Use Authorization (EUA). This EUA will remain  in effect (meaning this test can be used) for the duration of the COVID-19 declaration under Section 564(b)(1) of the Act, 21 U.S.C. section 360bbb-3(b)(1), unless the authorization is terminated or revoked sooner.     Influenza A by PCR NEGATIVE NEGATIVE Final   Influenza B by PCR NEGATIVE NEGATIVE Final    Comment: (NOTE) The Xpert Xpress SARS-CoV-2/FLU/RSV assay is intended as an aid in  the diagnosis of  influenza from Nasopharyngeal swab specimens and  should not be used as a sole basis for treatment. Nasal washings and  aspirates are unacceptable for Xpert Xpress SARS-CoV-2/FLU/RSV  testing.  Fact Sheet for Patients: https://www.moore.com/  Fact Sheet for Healthcare Providers: https://www.young.biz/  This test is not yet approved or cleared by the [place] FDA and  has been authorized for detection and/or diagnosis of SARS-CoV-2 by  FDA under an Emergency Use Authorization (EUA). This EUA will remain  in effect (meaning this test can be used) for the duration of the  Covid-19 declaration under Section 564(b)(1) of the Act, 21  U.S.C. section 360bbb-3(b)(1), unless the authorization is  terminated  or revoked.    Respiratory Syncytial Virus by PCR NEGATIVE NEGATIVE Final    Comment: (NOTE) Fact Sheet for Patients: https://www.moore.com/  Fact Sheet for Healthcare Providers: https://www.young.biz/  This test is not yet approved or cleared by the [place] FDA and  has been authorized for detection and/or diagnosis of SARS-CoV-2 by  FDA under an Emergency Use Authorization (EUA). This EUA will remain  in effect (meaning this test can be used) for the duration of the  COVID-19 declaration under Section 564(b)(1) of the Act, 21 U.S.C.  section 360bbb-3(b)(1), unless the authorization is terminated or  revoked. Performed at [facility], [address] [place], [place] [address]   MRSA PCR Screening     Status: None   Collection Time: 07/08/20  8:18 AM   Specimen: Nasal Mucosa; Nasopharyngeal  Result Value Ref Range Status   MRSA by PCR NEGATIVE NEGATIVE Final    Comment:        The GeneXpert MRSA Assay (FDA approved for NASAL specimens only), is one component of a comprehensive MRSA colonization surveillance program. It is not intended to diagnose MRSA infection nor to  guide or monitor treatment for MRSA infections. Performed at [facility], [address], [place], [place] [address]     Please note: You were cared for by a hospitalist during your hospital stay. Once you are discharged, your primary care physician will handle any further medical issues. Please note that NO REFILLS for any discharge medications will be authorized once you are discharged, as it is imperative that you return to your primary care physician (or establish a relationship with a primary care physician if you do not have one) for your post hospital discharge needs so that they can reassess your need for medications and monitor your lab values.    Time coordinating discharge: 40 minutes  SIGNED:   Burnadette Pop, MD  [facility] 07/13/2020, 11:22 AM Pager [phone]  If 7PM-7AM, please contact night-coverage [email or web address] Password [email or web address]

## 2020-07-13 NOTE — TOC Progression Note (Addendum)
Transition of Care [facility]) - Progression Note    Patient Details  Name: [name] [name] MRN: [id number] Date of Birth: Apr 05, 1935  Transition of Care [facility]) CM/SW Contact  Golda Acre, RN Phone Number: 07/13/2020, 10:29 AM  Clinical Narrative:    tct-Mary Miguel Rota with [employer].  Will follow up with patient for hospice at home care. Patient dcd to return to home with hoispice care has been set up through [employer]. tct-ptar for transport home.  Expected Discharge Plan: Home w Hospice Care Barriers to Discharge: Continued Medical Work up  Expected Discharge Plan and Services Expected Discharge Plan: Home w Hospice Care       Living arrangements for the past 2 months: Single Family Home                                       Social Determinants of Health (SDOH) Interventions    Readmission Risk Interventions Readmission Risk Prevention Plan 01/13/2020  Post Dischage Appt Complete  Medication Screening Complete  Transportation Screening Complete  Some recent data might be hidden

## 2020-07-13 NOTE — Progress Notes (Signed)
Discussed with daughter discharge instructions, they verbalized agreement and understanding.  Patient to leave by PTAR to go home.  Belongings sent with daughter.

## 2020-07-13 NOTE — Progress Notes (Signed)
Patent examiner [facility]) Hospital Liaison: RN note    Notified by Transition of Care Manger of patient/family request for [facility] services at home after discharge. Chart and patient information under review by [facility] physician. Hospice eligibility pending currently.    Writer spoke with daughter [name]            to initiate education related to hospice philosophy, services and team approach to care.  [name] verbalized understanding of information given. Per discussion, plan is for discharge to home by PTAR.   Please send signed and completed DNR form home with patient/family. Patient will need prescriptions for discharge comfort medications.     DME needs have been discussed, patient currently has the following equipment in the home:  Hospital bed.  Patient/family requests the following DME for delivery to the home:  none. Home address has been verified and is correct in the chart.      [name] is the family member to contact to arrange time of delivery.     [facility] aware of the above. Please notify [facility] when patient is ready to leave the unit at discharge. (Call [phone] or [phone] after 5pm.) [facility] information and contact numbers given to [facility].      Please call with any hospice related questions.     Thank you for this referral.    Yolande Jolly, BSN, [facility] (listed on AMION under [facility] [place])  [phone]

## 2020-07-14 ENCOUNTER — Other Ambulatory Visit: Payer: Self-pay

## 2020-07-14 ENCOUNTER — Other Ambulatory Visit: Payer: Medicare Other | Admitting: Hospice

## 2020-07-14 DIAGNOSIS — F0390 Unspecified dementia without behavioral disturbance: Secondary | ICD-10-CM

## 2020-07-14 DIAGNOSIS — Z515 Encounter for palliative care: Secondary | ICD-10-CM

## 2020-07-14 NOTE — Progress Notes (Signed)
Therapist, nutritional Palliative Care Consult Note Telephone: [phone]  Fax: [phone]  PATIENT NAME: [name] [name] DOB: 10-04-1934 MRN: [id number]  PRIMARY CARE PROVIDER:   Andi Devon, MD Andi Devon, MD [address] [place],  [place] [address]  REFERRING PROVIDER: Andi Devon, MD Andi Devon, MD [address] [place],  [place] [address]  RESPONSIBLE PARTY:Extended Emergency Contact Information Primary Emergency Contact: Cannaday,[name] [phone] cell at home Home Phone: [phone] Work Phone: [phone] Mobile Phone: [phone] Relation: Daughter Claudette - [phone]     RECOMMENDATIONS/PLAN:  Advance Care Planning/Goals of Care: Visit consisted of building trust and follow up on palliative care. [name] had requested a visit for assessment for possible hospice eligibility.   CODE STATUS: CODE STATUS reviewed.  Patient is a DO NOT RESUSCITATE.  Goals of care: Goals of care include to maximize quality of life and symptom management.MOST form selections include comfort care and no feeding tube.  Visit consisted of discussion dealing with the complex and emotionally intense issues of symptom management and palliative care in the setting of serious and potentially life-threatening illness.  Family is now open to hospice services, explaining that comfort and quality of life are the priority for patient.  Palliative care team will continue to support patient, patient's family, and medical team.   Symptom management:Patient with ongoing cognitive and functional decline.  Recent hospitalization 10/13 - 07/13/2020 for shortness of breath - was admitted for acute pulmonary edema. Patient treated and discharge home with Furosemide, Imdur and Metoprolol. Lisinopril was discontinued.   Patient is no longer able to walk as before.  She is now bedbound, weaker than before,, sleeping  more and not wanting to be bothered.  Family reports her appetite is poor, she refused dinner last night and breakfast this morning.  Education provided on worsening dementia in line with dementia disease trajectory in the presence of other comorbidities.  FAST 7C  FLACC 0. Patient was resting bed, denied patient/discomfort, in no acute distress. Daughter reports patient's current weight is 146 pounds down from 165 pounds 6 months ago, height 5 feet. [name] mentioned hospice referral may have been initiated in the hospital.  NP assured her to check to make sure.   In the office, NP confirmed there will be a hospice admission visit tomorrow at 12:30pm.  NP called [name] and nformed her; validation provided for her decision to let patient receive hospice service at this time.  She expressed appreciation.  I spent30minutes providing this consultation; time includes time spent with patient/family, chart review and documentation.More than 50% of the time in this consultation was spent on coordinating communication  HISTORY OF PRESENT ILLNESS:[name] W McCrayis a 85 y.o.year oldfemalewith multiple medical problems including dementia with behavioral disturbances, recent hospitalization for pulmonary edema; HTN, OA, Chronic back pain. Palliative Care was asked to help address goals of care. CODE STATUS:DO NOT RESUSCITATE PPS:30  HOSPICE ELIGIBILITY/DIAGNOSIS: Yes  PAST MEDICAL HISTORY:  Past Medical History:  Diagnosis Date  . Arthritis   . Dementia (HCC)   . Dvt femoral (deep venous thrombosis) (HCC)   . Hypertension     SOCIAL HX:  Social History   Tobacco Use  . Smoking status: Former Smoker    Types: Cigarettes    Quit date: 09/25/1976    Years since quitting: 43.8  . Smokeless tobacco: Never Used  Substance Use Topics  . Alcohol use: No    ALLERGIES: No Known Allergies   PERTINENT  MEDICATIONS:  Outpatient Encounter Medications as of 07/14/2020  Medication Sig  .  acetaminophen-codeine (TYLENOL #3) 300-30 MG tablet Take 1-2 tablets by mouth every 6 (six) hours as needed for moderate pain.   Marland Kitchen amLODipine (NORVASC) 10 MG tablet Take 1 tablet (10 mg total) by mouth daily.  Marland Kitchen apixaban (ELIQUIS) 5 MG TABS tablet Take 2.5 mg by mouth 2 (two) times daily.  Marland Kitchen atorvastatin (LIPITOR) 20 MG tablet Take 20 mg by mouth daily at 6 PM.   . Coenzyme Q10 (COQ10) 100 MG CAPS Take 1 tablet by mouth daily.  Marland Kitchen donepezil (ARICEPT) 10 MG tablet Take 10 mg by mouth daily.  . feeding supplement, ENSURE ENLIVE, (ENSURE ENLIVE) LIQD Take 237 mLs by mouth 2 (two) times daily between meals. (Patient taking differently: Take 237 mLs by mouth 3 (three) times daily between meals. )  . furosemide (LASIX) 40 MG tablet Take 1 tablet (40 mg total) by mouth daily.  . isosorbide mononitrate (IMDUR) 30 MG 24 hr tablet Take 1 tablet (30 mg total) by mouth daily.  . metoprolol tartrate (LOPRESSOR) 25 MG tablet Take 0.5 tablets (12.5 mg total) by mouth 2 (two) times daily.  . mirtazapine (REMERON) 7.5 MG tablet Take 7.5 mg by mouth at bedtime.   No facility-administered encounter medications on file as of 07/14/2020.    Rosaura Carpenter, NP

## 2020-09-25 DEATH — deceased
